# Patient Record
Sex: Female | Born: 1990 | Race: White | Hispanic: No | Marital: Married | Smoking: Never smoker
Health system: Southern US, Community
[De-identification: ages and names within clinical notes are randomized; demographics above are authoritative.]

## PROBLEM LIST (undated history)

## (undated) DIAGNOSIS — F509 Eating disorder, unspecified: Secondary | ICD-10-CM

## (undated) DIAGNOSIS — S99919A Unspecified injury of unspecified ankle, initial encounter: Secondary | ICD-10-CM

## (undated) DIAGNOSIS — F419 Anxiety disorder, unspecified: Secondary | ICD-10-CM

## (undated) DIAGNOSIS — R519 Headache, unspecified: Secondary | ICD-10-CM

## (undated) DIAGNOSIS — R51 Headache: Secondary | ICD-10-CM

## (undated) HISTORY — DX: Eating disorder, unspecified: F50.9

## (undated) HISTORY — DX: Headache, unspecified: R51.9

## (undated) HISTORY — DX: Headache: R51

## (undated) HISTORY — PX: HERNIA REPAIR: SHX51

## (undated) HISTORY — DX: Anxiety disorder, unspecified: F41.9

## (undated) HISTORY — PX: WISDOM TOOTH EXTRACTION: SHX21

---
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
## 2013-01-02 ENCOUNTER — Ambulatory Visit: Payer: 59 | Attending: Gastroenterology | Admitting: Physical Therapy

## 2013-01-02 DIAGNOSIS — IMO0001 Reserved for inherently not codable concepts without codable children: Secondary | ICD-10-CM | POA: Insufficient documentation

## 2013-01-02 DIAGNOSIS — M2420 Disorder of ligament, unspecified site: Secondary | ICD-10-CM | POA: Insufficient documentation

## 2013-01-02 DIAGNOSIS — M629 Disorder of muscle, unspecified: Secondary | ICD-10-CM | POA: Insufficient documentation

## 2013-01-08 ENCOUNTER — Ambulatory Visit: Payer: 59 | Attending: Gastroenterology | Admitting: Physical Therapy

## 2013-01-08 DIAGNOSIS — M629 Disorder of muscle, unspecified: Secondary | ICD-10-CM | POA: Insufficient documentation

## 2013-01-08 DIAGNOSIS — M2420 Disorder of ligament, unspecified site: Secondary | ICD-10-CM | POA: Insufficient documentation

## 2013-01-08 DIAGNOSIS — IMO0001 Reserved for inherently not codable concepts without codable children: Secondary | ICD-10-CM | POA: Insufficient documentation

## 2013-01-15 ENCOUNTER — Ambulatory Visit: Payer: 59 | Admitting: Physical Therapy

## 2013-01-22 ENCOUNTER — Ambulatory Visit: Payer: 59 | Admitting: Physical Therapy

## 2013-01-29 ENCOUNTER — Ambulatory Visit: Payer: 59 | Admitting: Physical Therapy

## 2013-02-05 ENCOUNTER — Ambulatory Visit: Payer: 59 | Attending: Gastroenterology | Admitting: Physical Therapy

## 2013-02-05 DIAGNOSIS — M629 Disorder of muscle, unspecified: Secondary | ICD-10-CM | POA: Insufficient documentation

## 2013-02-05 DIAGNOSIS — IMO0001 Reserved for inherently not codable concepts without codable children: Secondary | ICD-10-CM | POA: Insufficient documentation

## 2013-02-05 DIAGNOSIS — M2420 Disorder of ligament, unspecified site: Secondary | ICD-10-CM | POA: Insufficient documentation

## 2013-02-14 ENCOUNTER — Ambulatory Visit: Payer: 59 | Admitting: Physical Therapy

## 2013-02-19 ENCOUNTER — Ambulatory Visit: Payer: 59 | Admitting: Physical Therapy

## 2013-02-26 ENCOUNTER — Ambulatory Visit: Payer: 59 | Admitting: Physical Therapy

## 2013-03-05 ENCOUNTER — Ambulatory Visit: Payer: 59 | Attending: Gastroenterology | Admitting: Physical Therapy

## 2013-03-05 DIAGNOSIS — M2420 Disorder of ligament, unspecified site: Secondary | ICD-10-CM | POA: Insufficient documentation

## 2013-03-05 DIAGNOSIS — IMO0001 Reserved for inherently not codable concepts without codable children: Secondary | ICD-10-CM | POA: Insufficient documentation

## 2013-03-05 DIAGNOSIS — M629 Disorder of muscle, unspecified: Secondary | ICD-10-CM | POA: Insufficient documentation

## 2013-03-12 ENCOUNTER — Encounter: Payer: 59 | Admitting: Physical Therapy

## 2013-03-20 ENCOUNTER — Ambulatory Visit: Payer: 59 | Admitting: Physical Therapy

## 2013-03-26 ENCOUNTER — Encounter: Payer: 59 | Admitting: Physical Therapy

## 2013-04-02 ENCOUNTER — Encounter: Payer: 59 | Admitting: Physical Therapy

## 2013-04-04 ENCOUNTER — Ambulatory Visit: Payer: 59 | Attending: Gastroenterology | Admitting: Physical Therapy

## 2013-04-04 DIAGNOSIS — IMO0001 Reserved for inherently not codable concepts without codable children: Secondary | ICD-10-CM | POA: Insufficient documentation

## 2013-04-04 DIAGNOSIS — M629 Disorder of muscle, unspecified: Secondary | ICD-10-CM | POA: Insufficient documentation

## 2013-04-04 DIAGNOSIS — M2420 Disorder of ligament, unspecified site: Secondary | ICD-10-CM | POA: Insufficient documentation

## 2013-04-16 ENCOUNTER — Ambulatory Visit: Payer: 59 | Admitting: Physical Therapy

## 2013-04-30 ENCOUNTER — Encounter: Payer: 59 | Admitting: Physical Therapy

## 2013-05-07 ENCOUNTER — Ambulatory Visit: Payer: 59 | Attending: Gastroenterology | Admitting: Physical Therapy

## 2013-05-07 DIAGNOSIS — IMO0001 Reserved for inherently not codable concepts without codable children: Secondary | ICD-10-CM | POA: Insufficient documentation

## 2013-05-07 DIAGNOSIS — M629 Disorder of muscle, unspecified: Secondary | ICD-10-CM | POA: Insufficient documentation

## 2013-05-07 DIAGNOSIS — M2420 Disorder of ligament, unspecified site: Secondary | ICD-10-CM | POA: Insufficient documentation

## 2013-05-14 ENCOUNTER — Ambulatory Visit: Payer: 59 | Admitting: Physical Therapy

## 2013-05-22 ENCOUNTER — Ambulatory Visit: Payer: 59 | Admitting: Physical Therapy

## 2013-06-12 ENCOUNTER — Ambulatory Visit: Payer: 59 | Attending: Gastroenterology | Admitting: Physical Therapy

## 2013-06-12 DIAGNOSIS — IMO0001 Reserved for inherently not codable concepts without codable children: Secondary | ICD-10-CM | POA: Insufficient documentation

## 2013-06-12 DIAGNOSIS — M2420 Disorder of ligament, unspecified site: Secondary | ICD-10-CM | POA: Insufficient documentation

## 2013-06-12 DIAGNOSIS — M629 Disorder of muscle, unspecified: Secondary | ICD-10-CM | POA: Insufficient documentation

## 2013-06-19 ENCOUNTER — Ambulatory Visit: Payer: 59 | Admitting: Physical Therapy

## 2013-07-17 ENCOUNTER — Ambulatory Visit: Payer: 59 | Attending: Gastroenterology | Admitting: Physical Therapy

## 2013-07-17 DIAGNOSIS — IMO0001 Reserved for inherently not codable concepts without codable children: Secondary | ICD-10-CM | POA: Diagnosis present

## 2013-07-17 DIAGNOSIS — M2420 Disorder of ligament, unspecified site: Secondary | ICD-10-CM | POA: Diagnosis not present

## 2013-07-17 DIAGNOSIS — M629 Disorder of muscle, unspecified: Secondary | ICD-10-CM | POA: Insufficient documentation

## 2013-08-20 ENCOUNTER — Ambulatory Visit: Payer: 59 | Attending: Gastroenterology | Admitting: Physical Therapy

## 2013-08-20 DIAGNOSIS — M629 Disorder of muscle, unspecified: Secondary | ICD-10-CM | POA: Diagnosis not present

## 2013-08-20 DIAGNOSIS — M2420 Disorder of ligament, unspecified site: Secondary | ICD-10-CM | POA: Insufficient documentation

## 2013-08-20 DIAGNOSIS — IMO0001 Reserved for inherently not codable concepts without codable children: Secondary | ICD-10-CM | POA: Insufficient documentation

## 2013-09-17 ENCOUNTER — Ambulatory Visit: Payer: 59 | Admitting: Physical Therapy

## 2013-09-27 ENCOUNTER — Ambulatory Visit: Payer: 59 | Attending: Gastroenterology | Admitting: Physical Therapy

## 2013-09-27 DIAGNOSIS — IMO0001 Reserved for inherently not codable concepts without codable children: Secondary | ICD-10-CM | POA: Diagnosis present

## 2013-09-27 DIAGNOSIS — M629 Disorder of muscle, unspecified: Secondary | ICD-10-CM | POA: Insufficient documentation

## 2013-09-27 DIAGNOSIS — M2420 Disorder of ligament, unspecified site: Secondary | ICD-10-CM | POA: Diagnosis not present

## 2013-11-05 ENCOUNTER — Ambulatory Visit: Payer: 59 | Attending: Gastroenterology | Admitting: Physical Therapy

## 2013-11-05 DIAGNOSIS — K5900 Constipation, unspecified: Secondary | ICD-10-CM | POA: Diagnosis present

## 2015-03-03 ENCOUNTER — Ambulatory Visit (INDEPENDENT_AMBULATORY_CARE_PROVIDER_SITE_OTHER): Payer: Commercial Managed Care - HMO | Admitting: Sports Medicine

## 2015-03-03 ENCOUNTER — Ambulatory Visit (INDEPENDENT_AMBULATORY_CARE_PROVIDER_SITE_OTHER): Payer: Commercial Managed Care - HMO

## 2015-03-03 VITALS — BP 114/70 | HR 64 | Resp 18 | Wt 118.0 lb

## 2015-03-03 DIAGNOSIS — M25571 Pain in right ankle and joints of right foot: Secondary | ICD-10-CM | POA: Insufficient documentation

## 2015-03-03 NOTE — Progress Notes (Signed)
   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 Since then she's had persistent pain without mechanical symptoms, moderate, persistent with radiation around the underside and posterior of the lateral malleolus.  Past medical history, Surgical history, Family history not pertinant except as noted below, Social history, Allergies, and medications have been entered into the medical record, reviewed, and no changes needed.   Review of Systems: No headache, visual changes, nausea, vomiting, diarrhea, constipation, dizziness, abdominal pain, skin rash, fevers, chills, night sweats, weight loss, swollen lymph nodes, body aches, joint swelling, muscle aches, chest pain, shortness of breath, mood changes, visual or auditory hallucinations.   Objective:   General: Well Developed, well nourished, and in no acute distress.  Neuro/Psych: Alert and oriented x3, extra-ocular muscles intact, able to move all 4 extremities, sensation grossly intact. Skin: Warm and dry, no rashes noted.  Respiratory: Not using accessory muscles, speaking in full sentences, trachea midline.  Cardiovascular: Pulses palpable, no extremity edema. Abdomen: Does not appear distended. Right Ankle: Visible fullness behind the lateral malleolus Range of motion is full in all directions. Strength is 5/5 in all directions. Stable lateral and medial ligaments; squeeze test and kleiger test unremarkable; Talar dome nontender; No pain at base of 5th MT; No tenderness over cuboid; No tenderness over N spot or navicular prominence Tender behind the lateral malleolus over the peroneus longus, with  reproduction of pain with resisted eversion. No sign of peroneal tendon subluxations; Negative tarsal tunnel tinel's  Procedure: Real-time Ultrasound Guided Injection of right peroneal tendon sheath Device: GE Logiq E  Verbal informed consent obtained.  Time-out conducted.  Noted no overlying erythema, induration, or other signs of local infection.  Skin prepped in a sterile fashion.  Local anesthesia: Topical Ethyl chloride.  With sterile technique and under real time ultrasound guidance:  25-gauge needle advanced just deep to the peroneus longus and the fibula, 1 mL kenalog 40, 1 mL lidocaine, 1 mL Marcaine injected easily. Completed without difficulty  Pain immediately resolved suggesting accurate placement of the medication.  Advised to call if fevers/chills, erythema, induration, drainage, or persistent bleeding.  Images permanently stored and available for review in the ultrasound unit.  Impression: Technically successful ultrasound guided injection.  Impression and Recommendations:   This case required medical decision making of moderate complexity.

## 2015-03-03 NOTE — Assessment & Plan Note (Signed)
Likely represents peroneal tendinitis, injected as above, continue boot for another week, then transition into home rehabilitation exercises.

## 2015-03-26 ENCOUNTER — Ambulatory Visit (INDEPENDENT_AMBULATORY_CARE_PROVIDER_SITE_OTHER): Payer: Commercial Managed Care - HMO | Admitting: Sports Medicine

## 2015-03-26 DIAGNOSIS — M25571 Pain in right ankle and joints of right foot: Secondary | ICD-10-CM

## 2015-03-26 MED ORDER — MELOXICAM 15 MG PO TABS: ORAL_TABLET | ORAL | Status: DC

## 2015-03-26 NOTE — Progress Notes (Signed)
  Subjective:    CC: Follow-up  HPI: Peroneal Tendinitis: Fantastic response to peroneal tendon sheath injection at the last visit, she is pain-free for the most part.  She is hesitant to get back into running and biking.  Past medical history, Surgical history, Family history not pertinant except as noted below, Social history, Allergies, and medications have been entered into the medical record, reviewed, and no changes needed.   Review of Systems: No fevers, chills, night sweats, weight loss, chest pain, or shortness of breath.   Objective:    General: Well Developed, well nourished, and in no acute distress.  Neuro: Alert and oriented x3, extra-ocular muscles intact, sensation grossly intact.  HEENT: Normocephalic, atraumatic, pupils equal round reactive to light, neck supple, no masses, no lymphadenopathy, thyroid nonpalpable.  Skin: Warm and dry, no rashes. Cardiac: Regular rate and rhythm, no murmurs rubs or gallops, no lower extremity edema.  Respiratory: Clear to auscultation bilaterally. Not using accessory muscles, speaking in full sentences. Right Ankle: No visible erythema or swelling. Range of motion is full in all directions. Strength is 5/5 in all directions. Stable lateral and medial ligaments; squeeze test and kleiger test unremarkable; Talar dome nontender; No pain at base of 5th MT; No tenderness over cuboid; No tenderness over N spot or navicular prominence No tenderness on posterior aspects of lateral and medial malleolus No sign of peroneal tendon subluxations; Negative tarsal tunnel tinel's Able to walk 4 steps. Able to jump up and down on the affected extremity without pain. Marked pes cavus.  Impression and Recommendations:

## 2015-03-26 NOTE — Assessment & Plan Note (Signed)
Fantastic response to peroneal tendon sheath injection at the last visit. Continue to work aggressively with rehabilitation exercises. She does have severe pes cavus, and will return for custom orthotics. Adding meloxicam. Ultimately we may need an MRI if persistence of symptoms.

## 2015-03-28 ENCOUNTER — Ambulatory Visit (INDEPENDENT_AMBULATORY_CARE_PROVIDER_SITE_OTHER): Payer: Commercial Managed Care - HMO | Admitting: Sports Medicine

## 2015-03-28 VITALS — BP 121/67 | HR 78 | Resp 18 | Wt 115.0 lb

## 2015-03-28 DIAGNOSIS — M25571 Pain in right ankle and joints of right foot: Secondary | ICD-10-CM | POA: Diagnosis not present

## 2015-03-28 NOTE — Progress Notes (Signed)

## 2015-03-28 NOTE — Assessment & Plan Note (Signed)
Good response to peroneal tendon sheath injection. We'll continue to work aggressively with rehabilitation exercises and we'll gently try to get back into a walk/run regimen. Continue meloxicam. Custom orthotics as above, if persistence of symptoms in one month we will consider an MRI.

## 2015-03-31 ENCOUNTER — Ambulatory Visit: Payer: Commercial Managed Care - HMO | Admitting: Sports Medicine

## 2015-04-28 ENCOUNTER — Ambulatory Visit: Payer: Commercial Managed Care - HMO | Admitting: Sports Medicine

## 2015-05-02 ENCOUNTER — Ambulatory Visit: Payer: Commercial Managed Care - HMO | Admitting: Sports Medicine

## 2015-05-05 ENCOUNTER — Ambulatory Visit (INDEPENDENT_AMBULATORY_CARE_PROVIDER_SITE_OTHER): Payer: Commercial Managed Care - HMO | Admitting: Sports Medicine

## 2015-05-05 VITALS — BP 112/60 | HR 69 | Resp 16 | Wt 116.4 lb

## 2015-05-05 DIAGNOSIS — M25571 Pain in right ankle and joints of right foot: Secondary | ICD-10-CM | POA: Diagnosis not present

## 2015-05-05 NOTE — Progress Notes (Signed)
  Subjective:    CC: follow-up  HPI: Right ankle pain: Diagnosis peroneal tendinitis, injected and did extremely well, we made custom orthotics at the last visit and she is pain-free and getting back into running. Still has some weakness but overall doing okay.  Past medical history, Surgical history, Family history not pertinant except as noted below, Social history, Allergies, and medications have been entered into the medical record, reviewed, and no changes needed.   Review of Systems: No fevers, chills, night sweats, weight loss, chest pain, or shortness of breath.   Objective:    General: Well Developed, well nourished, and in no acute distress.  Neuro: Alert and oriented x3, extra-ocular muscles intact, sensation grossly intact.  HEENT: Normocephalic, atraumatic, pupils equal round reactive to light, neck supple, no masses, no lymphadenopathy, thyroid nonpalpable.  Skin: Warm and dry, no rashes. Cardiac: Regular rate and rhythm, no murmurs rubs or gallops, no lower extremity edema.  Respiratory: Clear to auscultation bilaterally. Not using accessory muscles, speaking in full sentences. Right Ankle: No visible erythema or swelling. Range of motion is full in all directions. Strength is 5/5 in all directions. Stable lateral and medial ligaments; squeeze test and kleiger test unremarkable; Talar dome nontender; No pain at base of 5th MT; No tenderness over cuboid; No tenderness over N spot or navicular prominence No tenderness on posterior aspects of lateral and medial malleolus No sign of peroneal tendon subluxations; Negative tarsal tunnel tinel's Able to walk 4 steps.  Impression and Recommendations:

## 2015-05-05 NOTE — Assessment & Plan Note (Signed)
Did extremely well after apparently all tendon sheath injection as well as custom orthotics, pain-free now, building up her strength. Adding consent for as needed use, may continue meloxicam as needed, and return to see me in 6 months. If she is not completely better in 3 months we will obtain an MRI.

## 2015-06-05 ENCOUNTER — Telehealth: Payer: Self-pay

## 2015-06-05 DIAGNOSIS — M25571 Pain in right ankle and joints of right foot: Secondary | ICD-10-CM

## 2015-06-05 NOTE — Telephone Encounter (Signed)
Left VM informing pt MRI ordered.

## 2015-06-05 NOTE — Telephone Encounter (Signed)
Pt left VM stating she doesn't feel her strain is healing the way you all feel it should. Would like to know if it's time to have an MRI ordered. Please advise.

## 2015-06-05 NOTE — Telephone Encounter (Signed)
Yes, MRI ordered.

## 2015-07-09 ENCOUNTER — Ambulatory Visit (INDEPENDENT_AMBULATORY_CARE_PROVIDER_SITE_OTHER): Payer: Commercial Managed Care - HMO | Admitting: Sports Medicine

## 2015-07-09 DIAGNOSIS — M25571 Pain in right ankle and joints of right foot: Secondary | ICD-10-CM

## 2015-07-09 NOTE — Progress Notes (Signed)
  Subjective:    CC: Recurrent right ankle pain  HPI: This is a pleasant 25 year old female, she is a long distance runner, we treated her several months ago for right peroneal tendinitis, she has custom orthotics, and did some home rehabilitation exercises, we also injected her peroneal tendon sheath and she did well. Unfortunately she started having recurrence of pain after a long hike, no popping, no mechanical symptoms. We did obtain an MRI the results of which will be dictated below.  Past medical history, Surgical history, Family history not pertinant except as noted below, Social history, Allergies, and medications have been entered into the medical record, reviewed, and no changes needed.   Review of Systems: No fevers, chills, night sweats, weight loss, chest pain, or shortness of breath.   Objective:    General: Well Developed, well nourished, and in no acute distress.  Neuro: Alert and oriented x3, extra-ocular muscles intact, sensation grossly intact.  HEENT: Normocephalic, atraumatic, pupils equal round reactive to light, neck supple, no masses, no lymphadenopathy, thyroid nonpalpable.  Skin: Warm and dry, no rashes. Cardiac: Regular rate and rhythm, no murmurs rubs or gallops, no lower extremity edema.  Respiratory: Clear to auscultation bilaterally. Not using accessory muscles, speaking in full sentences. Right Ankle: No visible erythema or swelling. Range of motion is full in all directions. Strength is 5/5 in all directions. Stable lateral and medial ligaments; squeeze test and kleiger test unremarkable; Tender to palpation over the lateral talar dome and talofibular joint No pain at base of 5th MT; No tenderness over cuboid; No tenderness over N spot or navicular prominence Tender to palpation behind the lateral malleolus reproduction of pain with resisted eversion No sign of peroneal tendon subluxations; Negative tarsal tunnel tinel's Able to walk 4  steps.  Procedure: Real-time Ultrasound Guided Injection of right ankle joint Device: GE Logiq E  Verbal informed consent obtained.  Time-out conducted.  Noted no overlying erythema, induration, or other signs of local infection.  Skin prepped in a sterile fashion.  Local anesthesia: Topical Ethyl chloride.  With sterile technique and under real time ultrasound guidance:  1 mL lidocaine, 1 mL Kenalog 40 injected easily. Completed without difficulty  Pain immediately resolved suggesting accurate placement of the medication.  Advised to call if fevers/chills, erythema, induration, drainage, or persistent bleeding.  Images permanently stored and available for review in the ultrasound unit.  Impression: Technically successful ultrasound guided injection.  Procedure: Real-time Ultrasound Guided Injection of right peroneal tendon sheath Device: GE Logiq E  Verbal informed consent obtained.  Time-out conducted.  Noted no overlying erythema, induration, or other signs of local infection.  Skin prepped in a sterile fashion.  Local anesthesia: Topical Ethyl chloride.  With sterile technique and under real time ultrasound guidance:  1 mL lidocaine, 1 mL Kenalog 40 injected easily taking care to avoid intratendinous injection. Completed without difficulty  Pain immediately resolved suggesting accurate placement of the medication.  Advised to call if fevers/chills, erythema, induration, drainage, or persistent bleeding.  Images permanently stored and available for review in the ultrasound unit.  Impression: Technically successful ultrasound guided injection.  Impression and Recommendations:

## 2015-07-09 NOTE — Assessment & Plan Note (Signed)
Persistent peroneal symptoms, peroneal tendon sheath injection as well as ankle joint injection, she did have some pain at the sinus tarsi. Continue orthotics, I'm also going to recommend formal physical therapy, and she is going to be getting the [employer] Aircast A60 ankle support

## 2015-07-18 ENCOUNTER — Ambulatory Visit (INDEPENDENT_AMBULATORY_CARE_PROVIDER_SITE_OTHER): Payer: Commercial Managed Care - HMO | Admitting: Sports Medicine

## 2015-07-18 VITALS — BP 126/84 | HR 71

## 2015-07-18 DIAGNOSIS — M25571 Pain in right ankle and joints of right foot: Secondary | ICD-10-CM

## 2015-07-18 NOTE — Progress Notes (Signed)
    Subjective:    Patient ID: Brittany [name], female    DOB: 1990-07-23, 25 y.o.   MRN: [id number]  HPI  Pt presents to clinic to be fitted for an ankle brace.   Review of Systems     Objective:   Physical Exam        Assessment & Plan:   Ankle brace applied to the right foot.  Pt examined by Dr. Karie Schwalbe.   This was supposed to be a nurse visit however I spent a significant amount of time with the patient placing the brace and explaining to her what the next steps were.  I spent 25 minutes with this patient, greater than 50% was face-to-face time counseling regarding the above diagnoses  ___________________________________________ Ihor Austinhomas J. Benjamin Stainhekkekandam, M.D., ABFM., CAQSM. Primary Care and Sports Medicine [facility]  Adjunct Instructor of Family Medicine  [employer]

## 2015-07-18 NOTE — Assessment & Plan Note (Signed)
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 She has not yet started physical therapy. She is interested in seeing Dr. Darrick PennaFields which I think this is appropriate. I'm concerned that she has not been taking the appropriate amount of time off of running. Ultimately we may have to cast her for forced compliance. Special ankle brace was applied today.

## 2015-07-24 ENCOUNTER — Encounter: Payer: Self-pay | Admitting: Sports Medicine

## 2015-07-24 ENCOUNTER — Ambulatory Visit (INDEPENDENT_AMBULATORY_CARE_PROVIDER_SITE_OTHER): Payer: Commercial Managed Care - HMO | Admitting: Sports Medicine

## 2015-07-24 VITALS — BP 105/68 | HR 80 | Ht 64.0 in | Wt 115.0 lb

## 2015-07-24 DIAGNOSIS — M25571 Pain in right ankle and joints of right foot: Secondary | ICD-10-CM

## 2015-07-24 DIAGNOSIS — M216X9 Other acquired deformities of unspecified foot: Secondary | ICD-10-CM | POA: Diagnosis not present

## 2015-07-24 DIAGNOSIS — Q796 Ehlers-Danlos syndrome: Secondary | ICD-10-CM | POA: Diagnosis not present

## 2015-07-24 DIAGNOSIS — Q667 Congenital pes cavus, unspecified foot: Secondary | ICD-10-CM | POA: Insufficient documentation

## 2015-07-24 DIAGNOSIS — M357 Hypermobility syndrome: Secondary | ICD-10-CM | POA: Insufficient documentation

## 2015-07-24 NOTE — Progress Notes (Signed)
   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 Her pain first began about 2 years ago. She self treated by limiting activity and gradually increasing with swimming, then biking, and back to running. Her pain recurred 7 months ago without obvious injury or trauma to the area. She had suspected peroneal tendinitis and received injections in February.  An MRI of the Right ankle on 06/20/15 report is available which read an old well healed lateral malleolus fracture, peroneal brevis and longus tendon tendinosus, and degeneration of the posterior talofibular and calcanofibular ligaments.  Patient has continued right ankle pain and received another injection at the peroneal tendon sheath and ankle on 07/09/15. She has adjusted her activity to increase stationary biking, light running on flat surface (up to 4-5 miles). She uses an aircast brace and orthotics as well.  First injection helped but last injections only relieved pain for a few days.  No past medical history on file.  Review of Systems:   Review of Systems  Cardiovascular: Negative for leg swelling.  Musculoskeletal: Positive for joint pain. Negative for myalgias and back pain.       Right ankle pain  Skin: Negative for rash.  Neurological: Negative for tingling.   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 She appears well-developed. No distress.  Musculoskeletal:  Tenderness to palpation at the right lateral anterior and posterior malleolus. ROM intact at knees and ankles with hypermobility with foot inversion, talar mobility, elbow extension bilaterally. Increased laxity with thumb to wrist on left, not on  right. Feet with wide-forefoot and high arches. No warmth, erythema, or swelling on exam.   Neurological: She is alert and oriented to person, place, and time.  Strength 5/5 at bilateral lower extremities with extension/flexion at the knees, dorsiflexion/plantarflexion, hip abduction.   Skin: She is not diaphoretic.  Small ~0.5 cm circular contact abrasions at the medial and lateral malleolus on right foot     Assessment & Plan:   Patient with recurrent ankle injuries and ligament laxity on exam suggestive of benign hypermobility syndrome. She may likely have these recurrent injuries due to her hypermobile ankle ROM. Will continue current brace and orthotics and initiate additional exercise. -Continue current aircast brace and orthotics -Slip-on ankle support -Start single-leg balance, squat and reach, 1 foot ball bounce exercises -Continue activity as tolerated, ice ankle afterwards -f/u in 6 weeks

## 2015-07-24 NOTE — Assessment & Plan Note (Signed)
Pain is improved using ankle brace and orthotics  No TTP or pain today

## 2015-07-24 NOTE — Assessment & Plan Note (Signed)
This is likely giving her more ankle instability as well - "pedestal ankle"  She needs to really focus on a series of proprioception exercises and 1 leg balance She is not stable on this in office in spite of working on Bosu ball  Running gait is excellent  Needs to stop running trails until she gets better stability and cont using ankle support

## 2015-07-24 NOTE — Assessment & Plan Note (Signed)
I suspect this has triggered the ankle instability and injuries Both ankles have increased laxity  Thumb to wrist 1 elbow hyperextends Palms to floor 5th MCP . > 90 deg  Able to clasp hands behind back on shoulder mobility test

## 2015-08-20 ENCOUNTER — Ambulatory Visit: Payer: Commercial Managed Care - HMO | Admitting: Sports Medicine

## 2015-09-04 ENCOUNTER — Encounter (INDEPENDENT_AMBULATORY_CARE_PROVIDER_SITE_OTHER): Payer: Self-pay

## 2015-09-04 ENCOUNTER — Ambulatory Visit (INDEPENDENT_AMBULATORY_CARE_PROVIDER_SITE_OTHER): Payer: Commercial Managed Care - HMO | Admitting: Sports Medicine

## 2015-09-04 ENCOUNTER — Encounter: Payer: Self-pay | Admitting: Sports Medicine

## 2015-09-04 VITALS — BP 113/67 | Ht 64.0 in | Wt 115.0 lb

## 2015-09-04 DIAGNOSIS — Q796 Ehlers-Danlos syndrome: Secondary | ICD-10-CM

## 2015-09-04 DIAGNOSIS — M25571 Pain in right ankle and joints of right foot: Secondary | ICD-10-CM | POA: Diagnosis not present

## 2015-09-04 DIAGNOSIS — M357 Hypermobility syndrome: Secondary | ICD-10-CM

## 2015-09-04 NOTE — Patient Instructions (Signed)
Please continue ankle stabilization exercises.  You can add 1 legged squats to both sides into your regimen.  Continue wearing ankle brace while running.  You can also try doing exercises on a foam pad when feeling stable doing ankle stabilization exercises.

## 2015-09-04 NOTE — Progress Notes (Signed)
  Brittany [name] - 25 y.o. [name] MRN [id number]  Date of birth: 02-14-1990  SUBJECTIVE:  Including CC & ROS.  CC: right ankle pain Brittany [name] presents for follow-up of her right ankle pain and hypermobility syndrome. She has been doing the ankle stabilization and proprioceptive exercises as demonstrated in the office at her last visit about 6 weeks ago. She has been doing well with them but still is having some posterior lateral malleolus pain when running for long periods of time. She is an avid runner and usually runs about 50 minutes per day. She is able to run father and test her than her last visit she is still not 100% better. She presents for some guidance regarding further return to running. She states that her ankle brace does help her with feeling more stable her right ankle when she runs. The body helix does not help too much with ankle stabilization. Denies any numbness or tingling in her lower extremities.     ROS: No unexpected weight loss, fever, chills, swelling, instability, muscle pain, numbness/tingling, redness, otherwise see HPI   PMHx - Updated and reviewed.  Contributory factors include: Hypermobility syndrome PSHx - Updated and reviewed.  Contributory factors include:  Negative FHx - Updated and reviewed.  Contributory factors include:  Negative Social Hx - Updated and reviewed. Contributory factors include: Negative Medications - reviewed   DATA REVIEWED: Previous office visits  PHYSICAL EXAM:  VS: BP:113/67  HR: bpm  TEMP: ( )  RESP:   HT:5\' 4"  (162.6 cm)   WT:115 lb (52.2 kg)  BMI:19.8 PHYSICAL EXAM: Gen: NAD, alert, cooperative with exam, well-appearing HEENT: clear conjunctiva,  CV:  no edema, capillary refill brisk, normal rate Resp: non-labored Skin: no rashes, normal turgor  Neuro: no gross deficits.  Psych:  alert and oriented  Ankle & Foot: No visible swelling, ecchymosis, erythema, ulcers, calluses, blister Arch: Pes cavus present  Achilles  tendon without nodules or tenderness No swelling of retrocalcaneal bursa No pain at MT heads No pain at base of 5th MT; No tenderness over cuboid; No tenderness over N spot or navicular prominence No tenderness on lateral and medial malleolus No sign of peroneal tendon subluxations, but does have mild tenderness to palpation along the right peroneal tendon Full in plantarflexion, dorsiflexion, inversion, and eversion of the foot; flexion and extension of the toes She has 2+ laxity with talar tilt and inversion of the foot bilaterally Strength: 5/5 in all directions. 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  You can add 1 legged squats to both sides into your regimen.  Continue wearing ankle brace while running.  You can also try doing exercises on a foam pad when feeling stable doing ankle stabilization exercises.   Right ankle pain Continue wearing her orthotics on running. Continue ankle stabilization exercises. Continue wearing ankle brace when running. Discussed progression to running again with patient. Once feeling more stable on her ankle joint she can start weaning out of the ankle brace.

## 2015-09-04 NOTE — Assessment & Plan Note (Signed)
Continue wearing her orthotics on running. Continue ankle stabilization exercises. Continue wearing ankle brace when running. Discussed progression to running again with patient. Once feeling more stable on her ankle joint she can start weaning out of the ankle brace.

## 2015-09-04 NOTE — Assessment & Plan Note (Signed)
continue ankle stabilization exercises.  You can add 1 legged squats to both sides into your regimen.  Continue wearing ankle brace while running.  You can also try doing exercises on a foam pad when feeling stable doing ankle stabilization exercises.

## 2015-10-14 ENCOUNTER — Other Ambulatory Visit: Payer: Self-pay | Admitting: Sports Medicine

## 2015-10-14 DIAGNOSIS — M25571 Pain in right ankle and joints of right foot: Secondary | ICD-10-CM

## 2015-11-07 ENCOUNTER — Ambulatory Visit: Payer: Commercial Managed Care - HMO | Admitting: Sports Medicine

## 2016-01-05 HISTORY — PX: ANKLE SURGERY: SHX546

## 2016-02-09 ENCOUNTER — Encounter (INDEPENDENT_AMBULATORY_CARE_PROVIDER_SITE_OTHER): Payer: Self-pay

## 2016-02-09 ENCOUNTER — Ambulatory Visit (INDEPENDENT_AMBULATORY_CARE_PROVIDER_SITE_OTHER): Payer: Commercial Managed Care - HMO | Admitting: Sports Medicine

## 2016-02-09 VITALS — BP 100/64 | Ht 65.0 in | Wt 117.0 lb

## 2016-02-09 DIAGNOSIS — M25373 Other instability, unspecified ankle: Secondary | ICD-10-CM

## 2016-02-09 DIAGNOSIS — M357 Hypermobility syndrome: Secondary | ICD-10-CM

## 2016-02-09 NOTE — Progress Notes (Signed)
   Subjective:    Patient ID: Brittany [name], female    DOB: [birth year][name], 26 y.o.   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 Last seen 8/17 and advised to continue proprioception exercises and bracing on ankle. Reports good compliance and improvement from 50% to 60%. Continues to describe ankle instability w/ propensity to invert ankle R>L. Denies significant pain or recent ankle injuries. Currently running on flat road surfaces but unable to trail run, which is her goal.   Review of Systems As per HPI.     Objective:   Physical Exam  General: athletic-appearing Brittany [name] in NAD, A&Ox3  MSK: - nl medial longitudinal arch, decreased transverse arch, wide toe box - no bruising or effusion over ankles bilaterally - nl ROM to ankle plantarflexion/dorsiflexion bilaterally - increased ROM to foot inversion/eversion, 3+ talar tilt test on R and L - positive ankle anterior drawer bilaterally  Studies: R. Ankle MRI (2017) -  Tendinosis of peroneal brevis and longus. Degeneration of posterior TFL and CFP. Well-healed fibular fracture.    Assessment & Plan:   26 yo Brittany [name] trail runner w/ mild improvement of ankle instability w/ conservative management. While she appears to be functioning well, and running on flat surfaces, she is dissatisfied w/ her progress. We discussed surgical referral for possible ankle reconstruction but it is unclear how effective this procedure may be given her baseline hypermobility.   1. Surgical Consult- Contact information provided for Dr. Victorino DikeHewitt, foot and ankle ortho 2. PT Referral- Referred for formal PT to work on ankle stability 3. Continue bracing and activity PRN

## 2016-03-22 DIAGNOSIS — M25571 Pain in right ankle and joints of right foot: Secondary | ICD-10-CM | POA: Diagnosis not present

## 2016-03-22 DIAGNOSIS — M25572 Pain in left ankle and joints of left foot: Secondary | ICD-10-CM | POA: Diagnosis not present

## 2016-04-13 DIAGNOSIS — M25572 Pain in left ankle and joints of left foot: Secondary | ICD-10-CM | POA: Diagnosis not present

## 2016-04-13 DIAGNOSIS — M25571 Pain in right ankle and joints of right foot: Secondary | ICD-10-CM | POA: Diagnosis not present

## 2016-04-22 DIAGNOSIS — M25571 Pain in right ankle and joints of right foot: Secondary | ICD-10-CM | POA: Diagnosis not present

## 2016-04-22 DIAGNOSIS — M25572 Pain in left ankle and joints of left foot: Secondary | ICD-10-CM | POA: Diagnosis not present

## 2016-05-07 DIAGNOSIS — M25571 Pain in right ankle and joints of right foot: Secondary | ICD-10-CM | POA: Diagnosis not present

## 2016-05-07 DIAGNOSIS — M25572 Pain in left ankle and joints of left foot: Secondary | ICD-10-CM | POA: Diagnosis not present

## 2016-05-09 ENCOUNTER — Emergency Department (HOSPITAL_COMMUNITY)
Admission: EM | Admit: 2016-05-09 | Discharge: 2016-05-09 | Disposition: A | Discharge status: Home / Self Care | Payer: Commercial Managed Care - HMO | Attending: Emergency Medicine | Admitting: Emergency Medicine

## 2016-05-09 ENCOUNTER — Encounter (HOSPITAL_COMMUNITY): Payer: Self-pay | Admitting: Emergency Medicine

## 2016-05-09 ENCOUNTER — Emergency Department (HOSPITAL_COMMUNITY): Payer: Commercial Managed Care - HMO

## 2016-05-09 DIAGNOSIS — Y9289 Other specified places as the place of occurrence of the external cause: Secondary | ICD-10-CM | POA: Diagnosis not present

## 2016-05-09 DIAGNOSIS — M25571 Pain in right ankle and joints of right foot: Secondary | ICD-10-CM | POA: Diagnosis not present

## 2016-05-09 DIAGNOSIS — X509XXA Other and unspecified overexertion or strenuous movements or postures, initial encounter: Secondary | ICD-10-CM | POA: Diagnosis not present

## 2016-05-09 DIAGNOSIS — Y9302 Activity, running: Secondary | ICD-10-CM | POA: Diagnosis not present

## 2016-05-09 DIAGNOSIS — S93402A Sprain of unspecified ligament of left ankle, initial encounter: Secondary | ICD-10-CM | POA: Insufficient documentation

## 2016-05-09 DIAGNOSIS — S99912A Unspecified injury of left ankle, initial encounter: Secondary | ICD-10-CM | POA: Diagnosis present

## 2016-05-09 DIAGNOSIS — Y999 Unspecified external cause status: Secondary | ICD-10-CM | POA: Insufficient documentation

## 2016-05-09 DIAGNOSIS — T1490XA Injury, unspecified, initial encounter: Secondary | ICD-10-CM

## 2016-05-09 DIAGNOSIS — M19072 Primary osteoarthritis, left ankle and foot: Secondary | ICD-10-CM | POA: Insufficient documentation

## 2016-05-09 HISTORY — DX: Unspecified injury of unspecified ankle, initial encounter: S99.919A

## 2016-05-09 NOTE — Discharge Instructions (Signed)
Use is 1 of your splints as needed for comfort.  For pain, use ibuprofen, 400 mg 3 times a day with meals.  For the ankle sprain use ice on the sore area 3 times a day for 2 or 3 days.  After that begin using heat.  Heat is very helpful for arthritis.  You have a small amount of arthritis at the fibula talar junction, it is likely secondary to the repeated ankle sprains.  Follow-up with your sports medicine physician, for evaluation and treatment of the recurrent sprain and arthritis.

## 2016-05-09 NOTE — ED Triage Notes (Signed)
Twisted right ankle while running last night-- hx of injury to same ankle-- has ace wrap on, minimal swelling.

## 2016-05-09 NOTE — ED Provider Notes (Signed)
[facility]-EMERGENCY DEPT Provider Note   CSN: [id number] Arrival date & time: 05/09/16  1207   By signing my name below, I, Soijett Blue, attest that this documentation has been prepared under the direction and in the presence of Mancel BaleWentz, Maisha Bogen, MD. Electronically Signed: Soijett Blue, ED Scribe. 05/09/16. 1:00 PM.  History   Chief Complaint Chief Complaint  Patient presents with  . Ankle Injury    HPI Brittany [name] is a [age] y.o. female who presents to the Emergency Department complaining of right ankle injury occurring last night. Pt reports associated right ankle swelling. Pt has tried crutches and an ace wrap with no relief of her symptoms. Pt reports that she was running last night when she accidentally twisted her right ankle. She states that her right ankle pain is worsened with weight bearing. Pt notes that she is currently in rehab for prior recurrent right ankle injuries. Pt orthopedist is Dr. Darrick PennaFields and she notes that she will follow up with them for further evaluation. She denies color change, wound, and any other symptoms.      The history is provided by the patient. No language interpreter was used.    Past Medical History:  Diagnosis Date  . Ankle injury     Patient Active Problem List   Diagnosis Date Noted  . 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 Right ankle pain 03/03/2015    Past Surgical History:  Procedure Laterality Date  . WISDOM TOOTH EXTRACTION      OB History    No data available       Home Medications    Prior to Admission medications   Medication Sig Start Date End Date Taking? Authorizing Provider  meloxicam (MOBIC) 15 MG tablet TAKE 1 TABLET BY MOUTH EVERY MORNING WITH BREAKFAST FOR 2 WEEKS THEN DAILY AS NEEDED FOR PAIN 10/14/15   Monica Bectonhekkekandam, Thomas J, MD  sertraline (ZOLOFT) 100 MG tablet  02/27/15   [provider]  sertraline (ZOLOFT) 100 MG tablet TAKE 1 AND 1/2 TABLETS EACH  MORNING 09/01/15   [provider]  sertraline (ZOLOFT) 100 MG tablet TAKE 1 AND 1/2 TABLETS EACH MORNING 12/16/15   [provider]    Family History No family history on file.  Social History Social History  Substance Use Topics  . Smoking status: Never Smoker  . Smokeless tobacco: Never Used  . Alcohol use No     Allergies   Patient has no known allergies.   Review of Systems Review of Systems  Musculoskeletal: Positive for arthralgias (right ankle) and joint swelling (right ankle).  Skin: Negative for color change and wound.  All other systems reviewed and are negative.    Physical Exam Updated Vital Signs BP 119/69 (BP Location: Right Arm)   Pulse 86   Temp 98 F (36.7 C) (Oral)   Resp 18   Ht 5\' 4"  (1.626 m)   Wt 120 lb (54.4 kg)   LMP 04/16/2016   SpO2 98%   BMI 20.60 kg/m   Physical Exam  Constitutional: She is oriented to person, place, and time. She appears well-developed and well-nourished.  HENT:  Head: Normocephalic and atraumatic.  Eyes: EOM are normal.  Neck: Normal range of motion and phonation normal. Neck supple.  Cardiovascular: Normal rate and regular rhythm.   Pulmonary/Chest: Effort normal and breath sounds normal. She exhibits no tenderness.  Abdominal: Soft. She exhibits no distension.  Musculoskeletal: Normal range of motion.  Right ankle: She exhibits swelling. Tenderness.       Right lower leg: She exhibits bony tenderness.  Lateral right ankle tenderness and swelling. Minimal distal fibular tenderness. No instability. Neurovascularly intact.   Neurological: She is alert and oriented to person, place, and time. She exhibits normal muscle tone.  Skin: Skin is warm and dry.  Psychiatric: She has a normal mood and affect. Her behavior is normal. Judgment and thought content normal.  Nursing note and vitals reviewed.    ED Treatments / Results  DIAGNOSTIC STUDIES: Oxygen Saturation is 98% on RA, nl by my  interpretation.    COORDINATION OF CARE: 12:47 PM Discussed treatment plan with pt at bedside which includes right ankle xray and pt agreed to plan.   Radiology Dg Ankle Complete Right  Result Date: 05/09/2016 CLINICAL DATA:  Twisting injury recently with right ankle pain and swelling. Recurrent injuries. EXAM: RIGHT ANKLE - COMPLETE 3+ VIEW COMPARISON:  Right ankle radiographs 03/03/2015. FINDINGS: The mineralization and alignment are normal. There is no evidence of acute fracture or dislocation. The previously demonstrated tiny ossific density adjacent to the lateral malleolus is not clearly seen. There is mild spurring of both malleoli. Lateral soft tissue swelling has increased. There is evidence of an ankle joint effusion. IMPRESSION: Ankle joint effusion with lateral soft tissue swelling. No acute osseous findings demonstrated. Electronically Signed   By: Carey Bullocks M.D.   On: 05/09/2016 13:34    Procedures Procedures (including critical care time)  Medications Ordered in ED Medications - No data to display   Initial Impression / Assessment and Plan / ED Course  I have reviewed the triage vital signs and the nursing notes.  Pertinent imaging results that were available during my care of the patient were reviewed by me and considered in my medical decision making (see chart for details).     Medications - No data to display  Patient Vitals for the past 24 hrs:  BP Temp Temp src Pulse Resp SpO2 Height Weight  05/09/16 1336 107/72 - - 61 16 100 % - -  05/09/16 1214 - - - - - - 5\' 4"  (1.626 m) 120 lb (54.4 kg)  05/09/16 1212 119/69 98 F (36.7 C) Oral 86 18 98 % - -    2:01 PM Reevaluation with update and discussion. After initial assessment and treatment, an updated evaluation reveals no further complaints, findings discussed with the patient and all questions answered. Brittany [name] L    Final Clinical Impressions(s) / ED Diagnoses   Final diagnoses:  Sprain of left  ankle, unspecified ligament, initial encounter  DJD (degenerative joint disease), ankle and foot, left   Recurrent right ankle sprain, with mild degenerative joint changes, on my view of the x-ray.  These changes are beneath the distal fibula, at the talar junction.  Doubt fracture, gross joint instability or septic arthritis.  Nursing Notes Reviewed/ Care Coordinated Applicable Imaging Reviewed Interpretation of Laboratory Data incorporated into ED treatment  The patient appears reasonably screened and/or stabilized for discharge and I doubt any other medical condition or other [facility] requiring further screening, evaluation, or treatment in the ED at this time prior to discharge.  Plan: Home Medications-ibuprofen; Home Treatments-ankle splint as needed, gentle rehab; return here if the recommended treatment, does not improve the symptoms; Recommended follow up-sports medicine follow-up 1 week   New Prescriptions Discharge Medication List as of 05/09/2016  1:26 PM     I personally performed the services described in this documentation,  which was scribed in my presence. The recorded information has been reviewed and is accurate.     Mancel Bale, MD 05/09/16 [phone]

## 2016-05-14 ENCOUNTER — Ambulatory Visit (INDEPENDENT_AMBULATORY_CARE_PROVIDER_SITE_OTHER): Payer: Commercial Managed Care - HMO | Admitting: Student

## 2016-05-14 ENCOUNTER — Encounter: Payer: Self-pay | Admitting: Student

## 2016-05-14 VITALS — BP 118/62 | Ht 65.0 in | Wt 120.0 lb

## 2016-05-14 DIAGNOSIS — M357 Hypermobility syndrome: Secondary | ICD-10-CM

## 2016-05-14 DIAGNOSIS — IMO0001 Reserved for inherently not codable concepts without codable children: Secondary | ICD-10-CM

## 2016-05-14 DIAGNOSIS — S93401A Sprain of unspecified ligament of right ankle, initial encounter: Secondary | ICD-10-CM

## 2016-05-14 DIAGNOSIS — M25373 Other instability, unspecified ankle: Secondary | ICD-10-CM | POA: Diagnosis not present

## 2016-05-14 NOTE — Progress Notes (Deleted)
  Brittany SkainsMelanie Rice [name] - 26 y.o. female MRN [id number]  Date of birth: July 30, 1990  SUBJECTIVE:  Including CC & ROS.  CC: right ankle    Location:  Duration:  Quality:  Severity:  Timing:  Context:  Modifying factors:  Associated signs and symptoms:   ROS: No unexpected weight loss, fever, chills, swelling, instability, muscle pain, numbness/tingling, redness, otherwise see HPI   PMHx - Updated and reviewed.  Contributory factors include: Negative PSHx - Updated and reviewed.  Contributory factors include:  Negative FHx - Updated and reviewed.  Contributory factors include:  Negative Social Hx - Updated and reviewed. Contributory factors include: Negative Medications - reviewed   DATA REVIEWED: ***  PHYSICAL EXAM:  VS: BP:118/62  HR: bpm  TEMP: ( )  RESP:   HT:5\' 5"  (165.1 cm)   WT:120 lb (54.4 kg)  BMI:20 PHYSICAL EXAM: Gen: NAD, alert, cooperative with exam, well-appearing HEENT: clear conjunctiva,  CV:  no edema, capillary refill brisk, normal rate Resp: non-labored Skin: no rashes, normal turgor  Neuro: no gross deficits.  Psych:  alert and oriented ***  ASSESSMENT & PLAN:   No problem-specific Assessment & Plan notes found for this encounter.

## 2016-05-14 NOTE — Progress Notes (Signed)
Subjective:     Patient ID: Brittany [name], female   DOB: [birth year], 26 y.o.   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 This happened last Saturday 05/08/16 while running. She was wearing an ankle brace at the time. Patient reports initial swelling which has improved, and she is now able to bear some weight, using crutches intermittently. She was evaluated Sunday after injury in ED where Xrays showed no fracture but +joint effusion.  She was referred after last injury to Dr. Victorino DikeHewitt with foot/ankle ortho to discuss possible surgical options for stabilizing her ankle. She has not seen surgeon but has started additional PT with Dr. Liliane Bade'Hallaran.  Review of Systems +ankle instability +gait abnormality -numbness, tingling -fevers, chills +ecchymosis and swelling of skin     Objective:   Physical Exam General: Well-appearing female sitting on exam table. A&O x3, NAD Extremities: Warm, well-perfused. 2+ pulses in PT bilaterally.  Moderate edema over lateral side of R ankle under lateral malleolus.  Some residual bruising at lateral edge of R heel.  Normal longitudinal arch bilaterally. MSK: Tenderness to palpation over right ATFL and along lateral TF joint line. Normal ROM to dorsiflexion, plantar flexion bilaterally. Decreased ROM and strength to 4/5 with ankle inversion, eversion on Right limited by pain.  Increased talar tilt test bilaterally but left more lax than right. +ankle anterior drawer  Neuro: Sensation intact over bilateral distal extremities     Assessment:     Patient is 26 year old runner with ankle instability who has not improved with > 1 year conservative management of daily PT. She presents with new inversion ankle injury during running 05/08/16 that is likely a grade 2 sprain or partial tear of the ATFL, secondary to joint hypermobility. Today we  discussed surgical options with patient although she will benefit from discussing this and prognosis/recovery plan with Dr. Victorino DikeHewitt.    Plan:     Right ankle sprain: - New ankle brace, continue bracing with activity - Continue physical therapy with O'Halloren and home exercises - For now, just minimal ROM exercises with right ankle (alphabet) - Return if no improvement - NSAIDs PRN pain -gave new referral for Dr. Victorino DikeHewitt to discuss the merits of surgery for chronic ankle instability in setting of hypermobility. She was more open to getting opinion, given that she sprained her ankle again and it is limiting her ability to run.

## 2016-05-16 DIAGNOSIS — IMO0001 Reserved for inherently not codable concepts without codable children: Secondary | ICD-10-CM | POA: Insufficient documentation

## 2016-05-16 DIAGNOSIS — S93401A Sprain of unspecified ligament of right ankle, initial encounter: Secondary | ICD-10-CM | POA: Insufficient documentation

## 2016-06-07 DIAGNOSIS — M25571 Pain in right ankle and joints of right foot: Secondary | ICD-10-CM | POA: Diagnosis not present

## 2016-06-07 DIAGNOSIS — M25572 Pain in left ankle and joints of left foot: Secondary | ICD-10-CM | POA: Diagnosis not present

## 2016-06-11 DIAGNOSIS — G8929 Other chronic pain: Secondary | ICD-10-CM | POA: Diagnosis not present

## 2016-06-11 DIAGNOSIS — M25371 Other instability, right ankle: Secondary | ICD-10-CM | POA: Diagnosis not present

## 2016-06-11 DIAGNOSIS — M25571 Pain in right ankle and joints of right foot: Secondary | ICD-10-CM | POA: Diagnosis not present

## 2016-06-29 DIAGNOSIS — G8918 Other acute postprocedural pain: Secondary | ICD-10-CM | POA: Diagnosis not present

## 2016-06-29 DIAGNOSIS — M25371 Other instability, right ankle: Secondary | ICD-10-CM | POA: Diagnosis not present

## 2016-06-29 DIAGNOSIS — M7661 Achilles tendinitis, right leg: Secondary | ICD-10-CM | POA: Diagnosis not present

## 2016-06-29 DIAGNOSIS — M65871 Other synovitis and tenosynovitis, right ankle and foot: Secondary | ICD-10-CM | POA: Diagnosis not present

## 2016-07-14 DIAGNOSIS — S93491D Sprain of other ligament of right ankle, subsequent encounter: Secondary | ICD-10-CM | POA: Diagnosis not present

## 2016-07-30 DIAGNOSIS — S93491D Sprain of other ligament of right ankle, subsequent encounter: Secondary | ICD-10-CM | POA: Diagnosis not present

## 2016-09-09 DIAGNOSIS — M25572 Pain in left ankle and joints of left foot: Secondary | ICD-10-CM | POA: Diagnosis not present

## 2016-09-09 DIAGNOSIS — M25571 Pain in right ankle and joints of right foot: Secondary | ICD-10-CM | POA: Diagnosis not present

## 2016-09-16 DIAGNOSIS — M25572 Pain in left ankle and joints of left foot: Secondary | ICD-10-CM | POA: Diagnosis not present

## 2016-09-16 DIAGNOSIS — M25571 Pain in right ankle and joints of right foot: Secondary | ICD-10-CM | POA: Diagnosis not present

## 2016-09-21 DIAGNOSIS — M25572 Pain in left ankle and joints of left foot: Secondary | ICD-10-CM | POA: Diagnosis not present

## 2016-09-21 DIAGNOSIS — M25571 Pain in right ankle and joints of right foot: Secondary | ICD-10-CM | POA: Diagnosis not present

## 2016-09-30 DIAGNOSIS — Z09 Encounter for follow-up examination after completed treatment for conditions other than malignant neoplasm: Secondary | ICD-10-CM | POA: Diagnosis not present

## 2016-09-30 DIAGNOSIS — M25371 Other instability, right ankle: Secondary | ICD-10-CM | POA: Diagnosis not present

## 2016-10-01 DIAGNOSIS — M25571 Pain in right ankle and joints of right foot: Secondary | ICD-10-CM | POA: Diagnosis not present

## 2016-10-01 DIAGNOSIS — M25572 Pain in left ankle and joints of left foot: Secondary | ICD-10-CM | POA: Diagnosis not present

## 2016-10-05 DIAGNOSIS — M25572 Pain in left ankle and joints of left foot: Secondary | ICD-10-CM | POA: Diagnosis not present

## 2016-10-05 DIAGNOSIS — M25571 Pain in right ankle and joints of right foot: Secondary | ICD-10-CM | POA: Diagnosis not present

## 2016-10-14 DIAGNOSIS — M25571 Pain in right ankle and joints of right foot: Secondary | ICD-10-CM | POA: Diagnosis not present

## 2016-10-14 DIAGNOSIS — M25572 Pain in left ankle and joints of left foot: Secondary | ICD-10-CM | POA: Diagnosis not present

## 2016-10-27 DIAGNOSIS — M25572 Pain in left ankle and joints of left foot: Secondary | ICD-10-CM | POA: Diagnosis not present

## 2016-10-27 DIAGNOSIS — M25571 Pain in right ankle and joints of right foot: Secondary | ICD-10-CM | POA: Diagnosis not present

## 2016-11-03 DIAGNOSIS — M25571 Pain in right ankle and joints of right foot: Secondary | ICD-10-CM | POA: Diagnosis not present

## 2016-11-03 DIAGNOSIS — M25572 Pain in left ankle and joints of left foot: Secondary | ICD-10-CM | POA: Diagnosis not present

## 2016-11-10 DIAGNOSIS — M25572 Pain in left ankle and joints of left foot: Secondary | ICD-10-CM | POA: Diagnosis not present

## 2016-11-10 DIAGNOSIS — M25571 Pain in right ankle and joints of right foot: Secondary | ICD-10-CM | POA: Diagnosis not present

## 2016-11-17 DIAGNOSIS — M25572 Pain in left ankle and joints of left foot: Secondary | ICD-10-CM | POA: Diagnosis not present

## 2016-11-17 DIAGNOSIS — M25571 Pain in right ankle and joints of right foot: Secondary | ICD-10-CM | POA: Diagnosis not present

## 2016-11-24 DIAGNOSIS — M25571 Pain in right ankle and joints of right foot: Secondary | ICD-10-CM | POA: Diagnosis not present

## 2016-11-24 DIAGNOSIS — M25572 Pain in left ankle and joints of left foot: Secondary | ICD-10-CM | POA: Diagnosis not present

## 2016-12-01 DIAGNOSIS — M25572 Pain in left ankle and joints of left foot: Secondary | ICD-10-CM | POA: Diagnosis not present

## 2016-12-01 DIAGNOSIS — M25571 Pain in right ankle and joints of right foot: Secondary | ICD-10-CM | POA: Diagnosis not present

## 2016-12-03 DIAGNOSIS — M25371 Other instability, right ankle: Secondary | ICD-10-CM | POA: Diagnosis not present

## 2016-12-03 DIAGNOSIS — Z09 Encounter for follow-up examination after completed treatment for conditions other than malignant neoplasm: Secondary | ICD-10-CM | POA: Diagnosis not present

## 2016-12-08 DIAGNOSIS — M25571 Pain in right ankle and joints of right foot: Secondary | ICD-10-CM | POA: Diagnosis not present

## 2016-12-08 DIAGNOSIS — M25572 Pain in left ankle and joints of left foot: Secondary | ICD-10-CM | POA: Diagnosis not present

## 2016-12-22 DIAGNOSIS — M25572 Pain in left ankle and joints of left foot: Secondary | ICD-10-CM | POA: Diagnosis not present

## 2016-12-22 DIAGNOSIS — M25571 Pain in right ankle and joints of right foot: Secondary | ICD-10-CM | POA: Diagnosis not present

## 2017-01-12 DIAGNOSIS — M25571 Pain in right ankle and joints of right foot: Secondary | ICD-10-CM | POA: Diagnosis not present

## 2017-01-12 DIAGNOSIS — M25572 Pain in left ankle and joints of left foot: Secondary | ICD-10-CM | POA: Diagnosis not present

## 2017-01-19 DIAGNOSIS — M25571 Pain in right ankle and joints of right foot: Secondary | ICD-10-CM | POA: Diagnosis not present

## 2017-01-19 DIAGNOSIS — M25572 Pain in left ankle and joints of left foot: Secondary | ICD-10-CM | POA: Diagnosis not present

## 2017-02-02 DIAGNOSIS — M25572 Pain in left ankle and joints of left foot: Secondary | ICD-10-CM | POA: Diagnosis not present

## 2017-02-02 DIAGNOSIS — M25571 Pain in right ankle and joints of right foot: Secondary | ICD-10-CM | POA: Diagnosis not present

## 2017-02-23 DIAGNOSIS — M25572 Pain in left ankle and joints of left foot: Secondary | ICD-10-CM | POA: Diagnosis not present

## 2017-02-23 DIAGNOSIS — M25571 Pain in right ankle and joints of right foot: Secondary | ICD-10-CM | POA: Diagnosis not present

## 2017-03-09 DIAGNOSIS — M25571 Pain in right ankle and joints of right foot: Secondary | ICD-10-CM | POA: Diagnosis not present

## 2017-03-09 DIAGNOSIS — M25572 Pain in left ankle and joints of left foot: Secondary | ICD-10-CM | POA: Diagnosis not present

## 2017-03-23 DIAGNOSIS — M25371 Other instability, right ankle: Secondary | ICD-10-CM | POA: Diagnosis not present

## 2017-03-23 DIAGNOSIS — M25572 Pain in left ankle and joints of left foot: Secondary | ICD-10-CM | POA: Diagnosis not present

## 2017-03-23 DIAGNOSIS — M25571 Pain in right ankle and joints of right foot: Secondary | ICD-10-CM | POA: Diagnosis not present

## 2017-05-09 DIAGNOSIS — M25571 Pain in right ankle and joints of right foot: Secondary | ICD-10-CM | POA: Diagnosis not present

## 2017-05-09 DIAGNOSIS — M25572 Pain in left ankle and joints of left foot: Secondary | ICD-10-CM | POA: Diagnosis not present

## 2017-05-10 DIAGNOSIS — M25572 Pain in left ankle and joints of left foot: Secondary | ICD-10-CM | POA: Diagnosis not present

## 2017-05-10 DIAGNOSIS — M25571 Pain in right ankle and joints of right foot: Secondary | ICD-10-CM | POA: Diagnosis not present

## 2017-05-20 DIAGNOSIS — M25572 Pain in left ankle and joints of left foot: Secondary | ICD-10-CM | POA: Diagnosis not present

## 2017-05-20 DIAGNOSIS — M25571 Pain in right ankle and joints of right foot: Secondary | ICD-10-CM | POA: Diagnosis not present

## 2017-05-25 DIAGNOSIS — M25572 Pain in left ankle and joints of left foot: Secondary | ICD-10-CM | POA: Diagnosis not present

## 2017-05-25 DIAGNOSIS — M25571 Pain in right ankle and joints of right foot: Secondary | ICD-10-CM | POA: Diagnosis not present

## 2017-06-02 DIAGNOSIS — M25571 Pain in right ankle and joints of right foot: Secondary | ICD-10-CM | POA: Diagnosis not present

## 2017-06-02 DIAGNOSIS — M25572 Pain in left ankle and joints of left foot: Secondary | ICD-10-CM | POA: Diagnosis not present

## 2017-06-09 DIAGNOSIS — M25572 Pain in left ankle and joints of left foot: Secondary | ICD-10-CM | POA: Diagnosis not present

## 2017-06-09 DIAGNOSIS — M25571 Pain in right ankle and joints of right foot: Secondary | ICD-10-CM | POA: Diagnosis not present

## 2017-06-16 DIAGNOSIS — M25571 Pain in right ankle and joints of right foot: Secondary | ICD-10-CM | POA: Diagnosis not present

## 2017-06-16 DIAGNOSIS — M25572 Pain in left ankle and joints of left foot: Secondary | ICD-10-CM | POA: Diagnosis not present

## 2017-06-21 DIAGNOSIS — M25571 Pain in right ankle and joints of right foot: Secondary | ICD-10-CM | POA: Diagnosis not present

## 2017-06-21 DIAGNOSIS — M25572 Pain in left ankle and joints of left foot: Secondary | ICD-10-CM | POA: Diagnosis not present

## 2017-06-28 DIAGNOSIS — M25572 Pain in left ankle and joints of left foot: Secondary | ICD-10-CM | POA: Diagnosis not present

## 2017-06-28 DIAGNOSIS — M25571 Pain in right ankle and joints of right foot: Secondary | ICD-10-CM | POA: Diagnosis not present

## 2017-07-12 DIAGNOSIS — M25572 Pain in left ankle and joints of left foot: Secondary | ICD-10-CM | POA: Diagnosis not present

## 2017-07-12 DIAGNOSIS — M25571 Pain in right ankle and joints of right foot: Secondary | ICD-10-CM | POA: Diagnosis not present

## 2017-07-20 DIAGNOSIS — M25571 Pain in right ankle and joints of right foot: Secondary | ICD-10-CM | POA: Diagnosis not present

## 2017-07-20 DIAGNOSIS — M25572 Pain in left ankle and joints of left foot: Secondary | ICD-10-CM | POA: Diagnosis not present

## 2017-07-27 DIAGNOSIS — M25572 Pain in left ankle and joints of left foot: Secondary | ICD-10-CM | POA: Diagnosis not present

## 2017-07-27 DIAGNOSIS — M25571 Pain in right ankle and joints of right foot: Secondary | ICD-10-CM | POA: Diagnosis not present

## 2017-10-05 ENCOUNTER — Ambulatory Visit (INDEPENDENT_AMBULATORY_CARE_PROVIDER_SITE_OTHER): Payer: 59

## 2017-10-05 ENCOUNTER — Ambulatory Visit: Payer: Commercial Managed Care - HMO | Admitting: Sports Medicine

## 2017-10-05 ENCOUNTER — Ambulatory Visit: Payer: Self-pay

## 2017-10-05 VITALS — BP 100/68 | HR 68 | Ht 65.0 in | Wt 120.4 lb

## 2017-10-05 DIAGNOSIS — Z3200 Encounter for pregnancy test, result unknown: Secondary | ICD-10-CM | POA: Diagnosis not present

## 2017-10-05 DIAGNOSIS — M25551 Pain in right hip: Secondary | ICD-10-CM

## 2017-10-05 LAB — POCT URINE PREGNANCY: Preg Test, Ur: NEGATIVE

## 2017-10-05 NOTE — Procedures (Signed)
PROCEDURE NOTE:  Ultrasound Guided: Injection: Right hip Images were obtained and interpreted by myself, Gaspar Bidding, DO  Images have been saved and stored to PACS system. Images obtained on: GE S7 Ultrasound machine    ULTRASOUND FINDINGS:  Possible blunting of the right labrum.  Small hip effusion.  DESCRIPTION OF PROCEDURE:  The patient's clinical condition is marked by substantial pain and/or significant functional disability. Other conservative therapy has not provided relief, is contraindicated, or not appropriate. There is a reasonable likelihood that injection will significantly improve the patient's pain and/or functional impairment.   After discussing the risks, benefits and expected outcomes of the injection and all questions were reviewed and answered, the patient wished to undergo the above named procedure.  Verbal consent was obtained.  The ultrasound was used to identify the target structure and adjacent neurovascular structures. The skin was then prepped in sterile fashion and the target structure was injected under direct visualization using sterile technique as below:  Single injection performed as below: PREP: Alcohol and Ethel Chloride APPROACH:direct, stopcock technique, 22g 3.5 in. INJECTATE: 5 cc 1% lidocaine, 2 cc 0.5% Marcaine and 2 cc 40mg /mL DepoMedrol ASPIRATE: None DRESSING: Band-Aid  Post procedural instructions including recommending icing and warning signs for infection were reviewed.    This procedure was well tolerated and there were no complications.   IMPRESSION: Succesful Ultrasound Guided: Injection

## 2017-10-05 NOTE — Progress Notes (Signed)
Brittany [name]. Brittany [name] [facility] [phone]  Brittany [name] - 27 y.o. female MRN [id number]  Date of birth: [birth year]  Visit Date: 10/05/2017  PCP: Patient, No Pcp Per   Referred by: No ref. provider found   Scribe(s) for today's visit: Stevenson Clinch, CMA  SUBJECTIVE:  Brittany [name] is here for Initial Assessment (R hip pain)    HPI: Her R hip pain symptoms INITIALLY: Began several months ago but sx have started to flare-up more over the past few weeks. Sx started while doing therapy for her ankle. She is an avid runner. Described as mild-moderate tightness, can be sharp at times and achy at other times. Pain radiates into the R leg and at times into her back.  Worsened with abduction, jogging, stairs.  Improved with massage and stretching it.  Additional associated symptoms include: Hx of benign hypermobility syndrome. She has minor scoliosis. She reports some tightness in the L hip. Pain is mostly lateral, sometimes posterior, and other times anterior. Anterior pain radiates into the groin.     At this time symptoms are worsening compared to onset, sx are flaring up more over the past month.  She has not taken any medications for the pain. She has been doing PT. She has tried dry needling with minimal relief.   She has been seeing PT Dr. Hessie Diener. He sent her here.   No recent XR of hip or back.   REVIEW OF SYSTEMS: Denies night time disturbances. Reports fevers, chills, or night sweats. Denies unexplained weight loss. Denies personal history of cancer. Denies changes in bowel or bladder habits - pelvic floor dysphonia, relief with PT. Denies recent unreported falls. Denies new or worsening dyspnea or wheezing. Denies headaches or dizziness.  Denies numbness, tingling or weakness  In the extremities.  Denies dizziness or presyncopal episodes Reports lower extremity edema - recent ankle surgery.     HISTORY:  Prior history reviewed and updated per electronic medical record.  Social History   Occupational History  . Not on file  Tobacco Use  . Smoking status: Never Smoker  . Smokeless tobacco: Never Used  Substance and Sexual Activity  . Alcohol use: No    Alcohol/week: 0.0 standard drinks  . Drug use: No  . Sexual activity: Not on file   Social History   Social History Narrative  . Not on file    Past Medical History:  Diagnosis Date  . Ankle injury    Past Surgical History:  Procedure Laterality Date  . WISDOM TOOTH EXTRACTION     family history is not on file.  DATA OBTAINED & REVIEWED:  No results for input(s): HGBA1C, LABURIC, CREATINE in the last 8760 hours. Problem  Right Hip Pain   Followed by Dr. Berline Chough, seeing Brittany [name], PT for physical therapy X-rays of the hip were grossly unremarkable with questionable pincer deformity Intra-articular injection performed on 10/05/2017    OBJECTIVE:  VS:  HT:5\' 5"  (165.1 cm)   WT:120 lb 6.4 oz (54.6 kg)  BMI:20.04    BP:100/68  HR:68bpm  TEMP: ( )  RESP:98 %   PHYSICAL EXAM: CONSTITUTIONAL: Well-developed, Well-nourished and In no acute distress PSYCHIATRIC: Alert & appropriately interactive. and Not depressed or anxious appearing. RESPIRATORY: No increased work of breathing and Trachea Midline EYES: Pupils are equal., EOM intact without nystagmus. and No scleral icterus.  VASCULAR EXAM: Warm and well perfused NEURO: unremarkable  MSK Exam: Right hip  Well aligned, no significant deformity. No overlying skin changes. No focal bony tenderness No swelling Normal, non-painful Hip flexion, extension, internal and external rotation with pain and weakness with supine straight leg raise radiating to the groin. No significant pain with axial load and circumduction of the right hip.  Right anterior innominate. Normal, non-painful: Stinchfield testing.  Positive but negative FADIR and Faber testing.   Overall good flexibility however has varying degrees of flexibility with an the arc of hip flexion      ASSESSMENT   1. Right hip pain   2. Encounter for pregnancy test, result unknown     PLAN:  Pertinent additional documentation may be included in corresponding procedure notes, imaging studies, problem based documentation and patient instructions.  Procedures:  . US Guided Injection per procedure note  Medications:  No orders of the defined types were placed in this encounter.  Discussion/Instructions: Right hip pain Symptoms are consistent with likely underlying labral tear and markedly tight right hip flexors. Diagnostic and hopefully therapeutic injection performed today. Try to avoid exacerbating activities including squatting and rotational activities on one leg.  . Links to [employer] provided today per Patient Instructions.  These exercises were developed by Myles Lipps, [place] with a strong emphasis on core neuromuscular reducation and postural realignment through body-weight exercises. . Discussed the underlying features of tight hip flexors leading to crouched, fetal like position that results in spinal column compression.  Including lumbar hyperflexion with hypermobility, thoracic flexion with restrictive rotation and cervical lordosis reversal . Discussed red flag symptoms that warrant earlier emergent evaluation and patient voices understanding. . Activity modifications and the importance of avoiding exacerbating activities (limiting pain to no more than a 4 / 10 during or following activity) recommended and discussed.  Follow-up:  . Return in about 4 weeks (around 11/02/2017).  . If any lack of improvement: consider further diagnostic evaluation with MR arthrogram . At follow up will plan: to consider initial osteopathic manipulation     CMA/ATC served as scribe during this visit. History, Physical, and Plan performed by medical  provider. Documentation and orders reviewed and attested to.      [name], DO    [place] [facility] Physician

## 2017-10-05 NOTE — Patient Instructions (Addendum)
Also check out [employer]" which is a program developed by Dr. Myles Lipps.   There are links to a couple of his YouTube Videos below and I would like to see performing one of his videos 5-6 days per week.    A good intro video is: "Independence from Pain 7-minute Video" - [email or web address]   Exercises that focus more on the neck are as below: Dr. Derrill Kay with [employer] teaching neck and shoulder details Part 1 - https://[email or web address].be/[email or web address] Part 2 Dr. Derrill Kay with [facility] quick routine to practice daily - https://[email or web address].be/[email or web address]  Do not try to attempt the entire video when first beginning.    Try breaking of each exercise that he goes into shorter segments.  Otherwise if they perform an exercise for 45 seconds, start with 15 seconds and rest and then resume when they begin the new activity.  If you work your way up to being able to do these videos without having to stop, I expect you will see significant improvements in your pain.  If you enjoy his videos and would like to find out more you can look on his website: [email or web address].  He has a workout streaming option as well as a DVD set available for purchase.  Amazon has the best price for his DVDs.      You had an injection today.  Things to be aware of after injection are listed below: . You may experience no significant improvement or even a slight worsening in your symptoms during the first 24 to 48 hours.  After that we expect your symptoms to improve gradually over the next 2 weeks for the medicine to have its maximal effect.  You should continue to have improvement out to 6 weeks after your injection. . Dr. Berline Chough recommends icing the site of the injection for 20 minutes  1-2 times the day of your injection . You may shower but no swimming, tub bath or Jacuzzi for 24 hours. . If your bandage falls off this does not need to be replaced.  It is appropriate to remove the  bandage after 4 hours. . You may resume light activities as tolerated unless otherwise directed per Dr. Berline Chough during your visit  POSSIBLE STEROID SIDE EFFECTS:  Side effects from injectable steroids tend to be less than when taken orally however you may experience some of the symptoms listed below.  If experienced these should only last for a short period of time. Change in menstrual flow  Edema (swelling)  Increased appetite Skin flushing (redness)  Skin rash/acne  Thrush (oral) Yeast vaginitis    Increased sweating  Depression Increased blood glucose levels Cramping and leg/calf  Euphoria (feeling happy)  POSSIBLE PROCEDURE SIDE EFFECTS: The side effects of the injection are usually fairly minimal however if you may experience some of the following side effects that are usually self-limited and will is off on their own.  If you are concerned please feel free to call the office with questions:  Increased numbness or tingling  Nausea or vomiting  Swelling or bruising at the injection site   Please call our office if if you experience any of the following symptoms over the next week as these can be signs of infection:   Fever greater than 100.51F  Significant swelling at the injection site  Significant redness or drainage from the injection site  If after 2 weeks you are continuing to have worsening symptoms please call our office to discuss what the next appropriate actions  should be including the potential for a return office visit or other diagnostic testing.

## 2017-10-13 ENCOUNTER — Telehealth: Payer: Self-pay | Admitting: Sports Medicine

## 2017-10-13 NOTE — Telephone Encounter (Signed)
Copied from CRM [id number]. Topic: Quick Communication - See Telephone Encounter >> Oct 13, 2017  3:41 PM Jens Som A wrote: CRM for notification. See Telephone encounter for: 10/13/17. Ilene Qua Pt, from [employer] is calling for Dr. Berline Chough, Needed to know if patient has any updated precautions or updates regarding right hip?  He had not heard anything.  Is the patient still approiate for Pt? Please advise [phone]

## 2017-10-13 NOTE — Telephone Encounter (Signed)
Forwarding to Dr. Rigby to advise.  

## 2017-10-13 NOTE — Telephone Encounter (Signed)
See note

## 2017-11-02 ENCOUNTER — Encounter: Payer: Self-pay | Admitting: Sports Medicine

## 2017-11-02 ENCOUNTER — Ambulatory Visit: Payer: 59 | Admitting: Sports Medicine

## 2017-11-02 DIAGNOSIS — M25551 Pain in right hip: Secondary | ICD-10-CM | POA: Diagnosis not present

## 2017-11-02 NOTE — Assessment & Plan Note (Signed)
Right hip pain consistent with either possible labral tear and/or hip flexor/TFL tear.  Given failure of conservative management including physical therapy, intra-articular injection on 10/05/2017 and activity modifications she continues to have significant severe pain.  MR arthrogram of the right hip recommended and we will plan to follow-up with her after this is obtained.

## 2017-11-02 NOTE — Patient Instructions (Signed)
We are ordering an MRI for you today.  The imaging office will be calling you to schedule your appointment after we obtain authorization from your insurance company.   Please be sure you have signed up for MyChart so that we can get your results to you.  We will be in touch with you as soon as we can.  Please know, it can take up to 3-4 business days for the radiologist and Dr. Berline Chough to have time to review the results and determine the best appropriate action.  If there is something that appears to be surgical or needs a referral to other specialists we will let you know through MyChart or telephone.  Otherwise we will plan to schedule a follow up appointment with Dr. Berline Chough once we have the results.   [phone]

## 2017-11-02 NOTE — Progress Notes (Signed)
[name]. [name] [phone]  Dhruti Ghuman - 27 y.o. female MRN [id number]  Date of birth: 09-27-1990  Visit Date: 11/02/2017  PCP: Patient, No Pcp Per   Referred by: No ref. provider found   Scribe(s) for today's visit: Stevenson Clinch, CMA  SUBJECTIVE:  Danika Kluender is here for Follow-up (R hip pain)    HPI: Her R hip pain symptoms INITIALLY: Began several months ago but sx have started to flare-up more over the past few weeks. Sx started while doing therapy for her ankle. She is an avid runner. Described as mild-moderate tightness, can be sharp at times and achy at other times. Pain radiates into the R leg and at times into her back.  Worsened with abduction, jogging, stairs.  Improved with massage and stretching it.  Additional associated symptoms include: Hx of benign hypermobility syndrome. She has minor scoliosis. She reports some tightness in the L hip. Pain is mostly lateral, sometimes posterior, and other times anterior. Anterior pain radiates into the groin.     At this time symptoms are worsening compared to onset, sx are flaring up more over the past month.  She has not taken any medications for the pain. She has been doing PT. She has tried dry needling with minimal relief.   She has been seeing PT Dr. Hessie Diener. He sent her here.   No recent XR of hip or back.   11/02/2017: Compared to the last office visit on 10/05/17, her previously described R hip symptoms show no change  Current symptoms are mild-mod pain and tightness in her hip that can be sharp at times but achy at other times & are radiating to R groin She has not been doing the [employer].  She had a R hip steroid injection at her last visit which did not help or change her symptoms.  REVIEW OF SYSTEMS: Denies night time disturbances. Denies fevers, chills, or night sweats. Denies unexplained weight loss. Denies personal  history of cancer. Denies changes in bowel or bladder habits - pelvic floor dysphonia, relief with PT. Denies recent unreported falls. Denies new or worsening dyspnea or wheezing. Denies headaches or dizziness.  Denies numbness, tingling or weakness  In the extremities.  Denies dizziness or presyncopal episodes Reports lower extremity edema - recent R ankle surgery     HISTORY:  Prior history reviewed and updated per electronic medical record.  Social History   Occupational History  . Not on file  Tobacco Use  . Smoking status: Never Smoker  . Smokeless tobacco: Never Used  Substance and Sexual Activity  . Alcohol use: No    Alcohol/week: 0.0 standard drinks  . Drug use: No  . Sexual activity: Not on file   Social History   Social History Narrative  . Not on file     DATA OBTAINED & REVIEWED:  No results for input(s): HGBA1C, LABURIC, CREATINE in the last 8760 hours. . S/p Intra-articular injection 10/05/17 . Xray 10/05/17 - possible CAM deformity but otherwise normal XRay .   OBJECTIVE:  VS:  HT:5\' 5"  (165.1 cm)   WT:120 lb (54.4 kg)  BMI:19.97    BP:(!) 106/58  HR:81bpm  TEMP: ( )  RESP:97 %   PHYSICAL EXAM: CONSTITUTIONAL: Well-developed, Well-nourished and In no acute distress PSYCHIATRIC: Alert & appropriately interactive. and Not depressed or anxious appearing. RESPIRATORY: No increased work of breathing and Trachea Midline EYES: Pupils are equal., EOM  intact without nystagmus. and No scleral icterus.  VASCULAR EXAM: Warm and well perfused NEURO: unremarkable  MSK Exam: Right hip  Well aligned, no significant deformity. No overlying skin changes. TTP over anterior hip.  No pain over iliac crest   RANGE OF MOTION & STRENGTH  Normal, non-painful hip flexion, extension or IR   SPECIALITY TESTING:  mild pain with stinchfield  hip abduction strenght is normal,   no crepitation with axial load or cirumduction    ASSESSMENT   1. Pain of right hip  joint     PLAN:  Pertinent additional documentation may be included in corresponding procedure notes, imaging studies, problem based documentation and patient instructions.  Procedures:  . None  Medications:  No orders of the defined types were placed in this encounter.  Discussion/Instructions: Hip pain Right hip pain consistent with either possible labral tear and/or hip flexor/TFL tear.  Given failure of conservative management including physical therapy, intra-articular injection on 10/05/2017 and activity modifications she continues to have significant severe pain.  MR arthrogram of the right hip recommended and we will plan to follow-up with her after this is obtained.  . continue activity modifications. Okay to try elipitical . Discussed red flag symptoms that warrant earlier emergent evaluation and patient voices understanding. . Activity modifications and the importance of avoiding exacerbating activities (limiting pain to no more than a 4 / 10 during or following activity) recommended and discussed.  Follow-up:  . Return for MRI review in person.       CMA/ATC served as Neurosurgeon during this visit. History, Physical, and Plan performed by medical provider. Documentation and orders reviewed and attested to.      [name], DO    [facility]

## 2017-11-23 ENCOUNTER — Ambulatory Visit
Admission: RE | Admit: 2017-11-23 | Discharge: 2017-11-23 | Disposition: A | Discharge status: Home / Self Care | Payer: 59 | Source: Ambulatory Visit | Attending: Sports Medicine | Admitting: Sports Medicine

## 2017-11-23 DIAGNOSIS — M25551 Pain in right hip: Secondary | ICD-10-CM | POA: Diagnosis not present

## 2017-11-23 MED ORDER — IOPAMIDOL (ISOVUE-M 200) INJECTION 41%
20.0000 mL | Freq: Once | INTRAMUSCULAR | Status: AC
  Administered 2017-11-23: 20 mL via INTRA_ARTICULAR

## 2017-12-09 NOTE — Progress Notes (Signed)
Patient needs follow-up to go over these results. Ultimately there is not much that is definitive.

## 2017-12-12 NOTE — Progress Notes (Signed)
Called pt and she has been scheduled for 12/13/17 at 11:40 am

## 2017-12-13 ENCOUNTER — Ambulatory Visit (INDEPENDENT_AMBULATORY_CARE_PROVIDER_SITE_OTHER): Payer: 59 | Admitting: Sports Medicine

## 2017-12-13 ENCOUNTER — Encounter: Payer: Self-pay | Admitting: Sports Medicine

## 2017-12-13 VITALS — BP 104/68 | HR 64 | Ht 65.0 in | Wt 123.8 lb

## 2017-12-13 DIAGNOSIS — M25551 Pain in right hip: Secondary | ICD-10-CM

## 2017-12-13 NOTE — Patient Instructions (Addendum)

## 2017-12-13 NOTE — Progress Notes (Signed)
Brittany [name]. Brittany [name] at [facility] [phone]  Brittany [name] - 27 y.o. female MRN [id number]  Date of birth: 11/27/1990  Visit Date: 12/13/2017  PCP: Orland Mustard, MD   Referred by: No ref. provider found  SUBJECTIVE:   Chief Complaint  Patient presents with  . Follow-up    R hip pain - MRI review    HPI: Patient is here for follow-up of her right hip pain.  She has essentially stopped running due to this and has been limiting a lot of her day-to-day activities due to this right hip and groin pain and weakness.  She has reported some improvement with this.  She is here today to follow-up the MRI that she had on 11/23/2017.  REVIEW OF SYSTEMS: No significant nighttime awakenings due to this issue. Denies fevers, chills, recent weight gain or weight loss.  No night sweats.  Pt denies any change in bowel or bladder habits, muscle weakness, numbness or falls associated with this pain. Otherwise 12 point review of systems performed and is negative   HISTORY:  Prior history reviewed and updated per electronic medical record.  Patient Active Problem List   Diagnosis Date Noted  . GAD (generalized anxiety disorder) 12/16/2017  . Pelvic floor dysfunction 12/16/2017  . Right hip pain 11/02/2017    Followed by Dr. Berline Chough, seeing [name], PT for physical therapy X-rays of the hip were grossly unremarkable with questionable pincer deformity Intra-articular injection performed on 10/05/2017   . Grade 2 ankle sprain, right, initial encounter 05/16/2016  . Benign hypermobility syndrome 07/24/2015    Beighton score of 4   . 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 Right ankle pain 03/03/2015    MRI from [facility] shows tendinosis of the per. brevis and longus tendons posterior to the fibula without tears, there is  minimal surrounding tenosynovitis and mild reactive edema within the posterior lateral malleolus. There is also degenerative changes of the posterior talofibular ligament and calcaneofibular ligament. Evidence of old ankle fracture that's well-healed.  Review of XRay by me suggest that in addition to healed lat malleolar fx she probably has had a fracture at tip of medial maolleolus that has healed as well. Fits with recurrent history of sprains in her youth.    Social History   Occupational History  . Not on file  Tobacco Use  . Smoking status: Never Smoker  . Smokeless tobacco: Never Used  Substance and Sexual Activity  . Alcohol use: No    Alcohol/week: 0.0 standard drinks  . Drug use: No  . Sexual activity: Not on file   Social History   Social History Narrative  . Not on file    OBJECTIVE:  VS:  HT:5\' 5"  (165.1 cm)   WT:123 lb 12.8 oz (56.2 kg)  BMI:20.6    BP:104/68  HR:64bpm  TEMP: ( )  RESP:99 %   PHYSICAL EXAM: Adult female. No acute distress.  Alert and appropriate. Her left hip has improved hip flexion in a supine position she continues to have some weakness with this however.  Minimal pain at this time.  Her hip range of motion has improved with internal rotation and external rotation.    ASSESSMENT:   1. Pain of right hip joint   2. Right hip pain  PROCEDURES:  None  PLAN:  Pertinent additional documentation may be included in corresponding procedure notes, imaging studies, problem based documentation and patient instructions.  No problem-specific Assessment & Plan notes found for this encounter.  >50% of this 25 minute visit spent in direct patient counseling and/or coordination of care.  Discussion was focused on education regarding the in discussing the pathoetiology and anticipated clinical course of the above condition.  Ultimately her right hip continues to be significantly weak without focal findings on her MRI.  This may be coming from an  underlying pelvic floor and/or deep core muscle imbalance.  There is no indication of true pathologic findings on the pelvis MRI.  She should limit her activities based on pain but I am okay with her beginning to return to activities as she can tolerate I would like for her to work on hip flexor stretching as she has tight and I am able to reproduce a slight banjo stringing of the right hip flexors and a hyperextended position.  Links to [employer] videos provided today per Patient Instructions.  These exercises were developed by Myles LippsEric Goodman, DC with a strong emphasis on core neuromuscular reducation and postural realignment through body-weight exercises.   Discussed the underlying features of tight hip flexors leading to crouched, fetal like position that results in spinal column compression.  Including lumbar hyperflexion with hypermobility, thoracic flexion with restrictive rotation and cervical lordosis reversal.    Activity modifications and the importance of avoiding exacerbating activities (limiting pain to no more than a 4 / 10 during or following activity) recommended and discussed.  Discussed red flag symptoms that warrant earlier emergent evaluation and patient voices understanding.   No orders of the defined types were placed in this encounter.  Lab Orders  No laboratory test(s) ordered today   Imaging Orders  No imaging studies ordered today   Referral Orders  No referral(s) requested today    Return in about 6 weeks (around 01/24/2018).  Can consider osteopathic manipulation.         Andrena MewsMichael D Rigby, DO    [facility]

## 2017-12-16 ENCOUNTER — Encounter: Payer: Self-pay | Admitting: Family Medicine

## 2017-12-16 ENCOUNTER — Ambulatory Visit: Payer: 59 | Admitting: Family Medicine

## 2017-12-16 VITALS — BP 110/68 | HR 60 | Temp 98.3°F | Ht 65.0 in | Wt 120.8 lb

## 2017-12-16 DIAGNOSIS — M6289 Other specified disorders of muscle: Secondary | ICD-10-CM | POA: Diagnosis not present

## 2017-12-16 DIAGNOSIS — Z114 Encounter for screening for human immunodeficiency virus [HIV]: Secondary | ICD-10-CM | POA: Diagnosis not present

## 2017-12-16 DIAGNOSIS — F411 Generalized anxiety disorder: Secondary | ICD-10-CM

## 2017-12-16 DIAGNOSIS — Z0000 Encounter for general adult medical examination without abnormal findings: Secondary | ICD-10-CM | POA: Diagnosis not present

## 2017-12-16 LAB — TSH: TSH: 2.24 u[IU]/mL (ref 0.35–4.50)

## 2017-12-16 LAB — CBC WITH DIFFERENTIAL/PLATELET
Basophils Absolute: 0 10*3/uL (ref 0.0–0.1)
Basophils Relative: 0.9 % (ref 0.0–3.0)
Eosinophils Absolute: 0.5 10*3/uL (ref 0.0–0.7)
Eosinophils Relative: 12.2 % — ABNORMAL HIGH (ref 0.0–5.0)
HCT: 40 % (ref 36.0–46.0)
Hemoglobin: 13.4 g/dL (ref 12.0–15.0)
Lymphocytes Relative: 34.8 % (ref 12.0–46.0)
Lymphs Abs: 1.3 10*3/uL (ref 0.7–4.0)
MCHC: 33.6 g/dL (ref 30.0–36.0)
MCV: 92.8 fl (ref 78.0–100.0)
Monocytes Absolute: 0.4 10*3/uL (ref 0.1–1.0)
Monocytes Relative: 10.6 % (ref 3.0–12.0)
Neutro Abs: 1.6 10*3/uL (ref 1.4–7.7)
Neutrophils Relative %: 41.5 % — ABNORMAL LOW (ref 43.0–77.0)
Platelets: 199 10*3/uL (ref 150.0–400.0)
RBC: 4.31 Mil/uL (ref 3.87–5.11)
RDW: 13 % (ref 11.5–15.5)
WBC: 3.8 10*3/uL — ABNORMAL LOW (ref 4.0–10.5)

## 2017-12-16 LAB — COMPREHENSIVE METABOLIC PANEL
ALT: 11 U/L (ref 0–35)
AST: 12 U/L (ref 0–37)
Albumin: 4.6 g/dL (ref 3.5–5.2)
Alkaline Phosphatase: 42 U/L (ref 39–117)
BUN: 16 mg/dL (ref 6–23)
CO2: 26 mEq/L (ref 19–32)
Calcium: 9.7 mg/dL (ref 8.4–10.5)
Chloride: 106 mEq/L (ref 96–112)
Creatinine, Ser: 0.68 mg/dL (ref 0.40–1.20)
GFR: 110.12 mL/min (ref >60.00)
Glucose, Bld: 96 mg/dL (ref 70–99)
Potassium: 4.9 mEq/L (ref 3.5–5.1)
Sodium: 139 mEq/L (ref 135–145)
Total Bilirubin: 0.5 mg/dL (ref 0.2–1.2)
Total Protein: 7.1 g/dL (ref 6.0–8.3)

## 2017-12-16 LAB — LIPID PANEL
Cholesterol: 134 mg/dL (ref 0–200)
HDL: 58.7 mg/dL (ref >39.00)
LDL Cholesterol: 65 mg/dL (ref 0–99)
NonHDL: 75.71
Total CHOL/HDL Ratio: 2
Triglycerides: 53 mg/dL (ref 0.0–149.0)
VLDL: 10.6 mg/dL (ref 0.0–40.0)

## 2017-12-16 NOTE — Progress Notes (Signed)
Patient: Brittany [name] MRN: [id number] DOB: 16-Aug-1990 PCP: Patient, No Pcp Per     Subjective:  Chief Complaint  Patient presents with  . Establish Care    HPI: The patient is a 27 y.o. female who presents today for annual exam. She denies any changes to past medical history. There have been no recent hospitalizations. They are following a well balanced diet and exercise plan. Weight has been stable. No complaints today.   FH of diabetes in maternal grandparents. Mom is healthy-depression/anxiety  Dad is healthy Brother with thyroid/brother with depression.   GAD: she started taking zoloft at age 27 years of age. Diagnosed with anxiety at that time. 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 All of her issues stemmed from her OCD. She feels well controlled on her zoloft. No issues with eating now and OCD controlled.   There is no immunization history on file for this patient.  Pap smear: never had a pap smear due to pelvic floor. It is very tight.  Tdap: she thinks she is up to date Flu: declines  Review of Systems  Constitutional: Negative for chills, fatigue and fever.  HENT: Negative for dental problem, ear pain, hearing loss and trouble swallowing.   Eyes: Negative for visual disturbance.  Respiratory: Negative for cough, chest tightness and shortness of breath.   Cardiovascular: Negative for chest pain, palpitations and leg swelling.  Gastrointestinal: Negative for abdominal pain, blood in stool, constipation, diarrhea and nausea.  Endocrine: Negative for cold intolerance, polydipsia, polyphagia and polyuria.  Genitourinary: Negative for dysuria and hematuria.  Musculoskeletal: Positive for back pain and neck pain. Negative for arthralgias.       Followed by Dr. Berline Choughigby for this  Skin: Negative.  Negative for rash.  Neurological: Negative for dizziness and headaches.  Psychiatric/Behavioral: Negative for dysphoric mood and sleep disturbance. The patient is  not nervous/anxious.     Allergies Patient has No Known Allergies.  Past Medical History Patient  has a past medical history of Ankle injury.  Surgical History Patient  has a past surgical history that includes Wisdom tooth extraction.  Family History Pateint's family history is not on file.  Social History Patient  reports that she has never smoked. She has never used smokeless tobacco. She reports that she does not drink alcohol or use drugs.    Objective: Vitals:   12/16/17 0951  BP: 110/68  Pulse: 60  Temp: 98.3 F (36.8 C)  TempSrc: Oral  SpO2: 99%  Weight: 120 lb 12.8 oz (54.8 kg)  Height: 5\' 5"  (1.651 m)    Body mass index is 20.1 kg/m.  Physical Exam Vitals signs reviewed.  Constitutional:      Appearance: She is well-developed.  HENT:     Right Ear: External ear normal.     Left Ear: External ear normal.  Eyes:     Conjunctiva/sclera: Conjunctivae normal.     Pupils: Pupils are equal, round, and reactive to light.  Neck:     Musculoskeletal: Normal range of motion and neck supple.     Thyroid: No thyromegaly.  Cardiovascular:     Rate and Rhythm: Normal rate and regular rhythm.     Heart sounds: Normal heart sounds. No murmur.  Pulmonary:     Effort: Pulmonary effort is normal.     Breath sounds: Normal breath sounds.  Abdominal:     General: Bowel sounds are normal. There is no distension.     Palpations: Abdomen is soft.  Tenderness: There is no abdominal tenderness.  Lymphadenopathy:     Cervical: No cervical adenopathy.  Skin:    General: Skin is warm and dry.     Findings: No rash.  Neurological:     Mental Status: She is alert and oriented to person, place, and time.     Cranial Nerves: No cranial nerve deficit.     Coordination: Coordination normal.     Deep Tendon Reflexes: Reflexes normal.  Psychiatric:        Behavior: Behavior normal.        Assessment/plan: 1. Annual exam  Routine lab work today. Will come back for  attempted pap smear. Declines flu. Requesting records for her tdap/other physicians. Very fit- competetive runner.  Patient counseling [x]    Nutrition: Stressed importance of moderation in sodium/caffeine intake, saturated fat and cholesterol, caloric balance, sufficient intake of fresh fruits, vegetables, fiber, calcium, iron, and 1 mg of folate supplement per day (for females capable of pregnancy).  [x]    Stressed the importance of regular exercise.   []    Substance Abuse: Discussed cessation/primary prevention of tobacco, alcohol, or other drug use; driving or other dangerous activities under the influence; availability of treatment for abuse.   [x]    Injury prevention: Discussed safety belts, safety helmets, smoke detector, smoking near bedding or upholstery.   [x]    Sexuality: Discussed sexually transmitted diseases, partner selection, use of condoms, avoidance of unintended pregnancy  and contraceptive alternatives.  [x]    Dental health: Discussed importance of regular tooth brushing, flossing, and dental visits.  [x]    Health maintenance and immunizations reviewed. Please refer to Health maintenance section.    - CBC with Differential/Platelet - Comprehensive metabolic panel - Lipid panel - TSH - VITAMIN D 25 Hydroxy (Vit-D Deficiency, Fractures)  2. GAD (generalized anxiety disorder) Followed by Dr. Abbey Chatters in [place]. Well controlled Will take over her zoloft refills when needed. Requesting records.     Return in about 4 months (around 04/17/2018) for prn pap smear .    Orland Mustard, MD [facility]  12/16/2017

## 2017-12-16 NOTE — Patient Instructions (Signed)
Try flonase nightly x 2 weeks to see if helps mucous/drainage in the throat.   Postnasal Drip Postnasal drip is the feeling of mucus going down the back of your throat. Mucus is a slimy substance that moistens and cleans your nose and throat, as well as the air pockets in face bones near your forehead and cheeks (sinuses). Small amounts of mucus pass from your nose and sinuses down the back of your throat all the time. This is normal. When you produce too much mucus or the mucus gets too thick, you can feel it. Some common causes of postnasal drip include:  Having more mucus because of: ? A cold or the flu. ? Allergies. ? Cold air. ? Certain medicines.  Having more mucus that is thicker because of: ? A sinus or nasal infection. ? Dry air. ? A food allergy.  Follow these instructions at home: Relieving discomfort  Gargle with a salt-water mixture 3-4 times a day or as needed. To make a salt-water mixture, completely dissolve -1 tsp of salt in 1 cup of warm water.  If the air in your home is dry, use a humidifier to add moisture to the air.  Use a saline spray or container (neti pot) to flush out the nose (nasal irrigation). These methods can help clear away mucus and keep the nasal passages moist. General instructions  Take over-the-counter and prescription medicines only as told by your health care provider.  Follow instructions from your health care provider about eating or drinking restrictions. You may need to avoid caffeine.  Avoid things that you know you are allergic to (allergens), like dust, mold, pollen, pets, or certain foods.  Drink enough fluid to keep your urine pale yellow.  Keep all follow-up visits as told by your health care provider. This is important. Contact a health care provider if:  You have a fever.  You have a sore throat.  You have difficulty swallowing.  You have headache.  You have sinus pain.  You have a cough that does not go away.  The  mucus from your nose becomes thick and is green or yellow in color.  You have cold or flu symptoms that last more than 10 days. Summary  Postnasal drip is the feeling of mucus going down the back of your throat.  If your health care provider approves, use nasal irrigation or a nasal spray 2?4 times a day.  Avoid things that you know you are allergic to (allergens), like dust, mold, pollen, pets, or certain foods. This information is not intended to replace advice given to you by your health care provider. Make sure you discuss any questions you have with your health care provider. Document Released: 04/05/2016 Document Revised: 04/05/2016 Document Reviewed: 04/05/2016 Elsevier Interactive Patient Education  [employer]2018 Elsevier Inc.

## 2017-12-17 ENCOUNTER — Encounter: Payer: Self-pay | Admitting: Sports Medicine

## 2017-12-17 NOTE — Assessment & Plan Note (Signed)
Symptoms are consistent with likely underlying labral tear and markedly tight right hip flexors. Diagnostic and hopefully therapeutic injection performed today. Try to avoid exacerbating activities including squatting and rotational activities on one leg.

## 2018-01-24 ENCOUNTER — Ambulatory Visit: Payer: 59 | Admitting: Sports Medicine

## 2018-02-16 ENCOUNTER — Encounter: Payer: Self-pay | Admitting: Family Medicine

## 2018-02-16 DIAGNOSIS — F5001 Anorexia nervosa, restricting type: Secondary | ICD-10-CM | POA: Insufficient documentation

## 2018-02-16 DIAGNOSIS — Z8659 Personal history of other mental and behavioral disorders: Secondary | ICD-10-CM | POA: Insufficient documentation

## 2018-05-10 ENCOUNTER — Other Ambulatory Visit: Payer: Self-pay | Admitting: Family Medicine

## 2018-12-25 ENCOUNTER — Other Ambulatory Visit: Payer: Self-pay | Admitting: Family Medicine

## 2019-01-30 ENCOUNTER — Telehealth: Payer: Self-pay | Admitting: Family Medicine

## 2019-01-30 NOTE — Telephone Encounter (Signed)
Patient called in and wanted to see if Dr. Artis Flock would send in a referral for her to see  Doristine Counter M.D. at [facility]. Patient stated she seen her before and just needs a new referral sent in to see her.

## 2019-01-30 NOTE — Telephone Encounter (Signed)
Ok to start referral?  Message sent in my chart to let her know you are out of the office until Monday

## 2019-01-31 ENCOUNTER — Other Ambulatory Visit: Payer: Self-pay

## 2019-01-31 DIAGNOSIS — F411 Generalized anxiety disorder: Secondary | ICD-10-CM

## 2019-01-31 NOTE — Telephone Encounter (Signed)
Patient called and informed

## 2019-01-31 NOTE — Telephone Encounter (Signed)
Yes please put in referral.  Dx: depression I believe.  Orland Mustard, MD [place] [facility]

## 2019-01-31 NOTE — Telephone Encounter (Signed)
Referral started  

## 2019-02-05 ENCOUNTER — Telehealth: Payer: Self-pay

## 2019-02-05 NOTE — Telephone Encounter (Signed)
Patient is calling again regarding a referral. Patient states that she was told last Wednesday that Dr. Artis Flock already sent the referral.Patient would like for this referral to be sent off so she can go ahead and schedule her appt with  the Doctor. Name of the practice is [facility] in [place].

## 2019-02-05 NOTE — Telephone Encounter (Signed)
LVM for patient to call back about the status of her referral.

## 2019-02-06 NOTE — Telephone Encounter (Signed)
Called patient let her know I have spoke with the office yesterday when they called for clarification on referral. So I know they have referral she will call to set up app soon.

## 2019-03-20 ENCOUNTER — Encounter: Payer: Self-pay | Admitting: Physician Assistant

## 2019-03-20 ENCOUNTER — Ambulatory Visit (INDEPENDENT_AMBULATORY_CARE_PROVIDER_SITE_OTHER): Payer: 59

## 2019-03-20 ENCOUNTER — Encounter: Payer: Self-pay | Admitting: Family Medicine

## 2019-03-20 ENCOUNTER — Ambulatory Visit (INDEPENDENT_AMBULATORY_CARE_PROVIDER_SITE_OTHER): Payer: 59 | Admitting: Physician Assistant

## 2019-03-20 ENCOUNTER — Ambulatory Visit: Payer: 59 | Admitting: Family Medicine

## 2019-03-20 ENCOUNTER — Other Ambulatory Visit: Payer: Self-pay

## 2019-03-20 VITALS — BP 108/64 | HR 77 | Temp 97.5°F | Ht 65.0 in | Wt 121.2 lb

## 2019-03-20 VITALS — BP 108/64 | Ht 65.0 in | Wt 121.0 lb

## 2019-03-20 DIAGNOSIS — M79671 Pain in right foot: Secondary | ICD-10-CM | POA: Diagnosis not present

## 2019-03-20 NOTE — Patient Instructions (Signed)
Thank you for coming in today. Use the post op shoe as needed.  Use crutches as needed.  Rest the foot for a week or two. Cross training is ok.  Recheck in 1-2 weeks.  Make sure you are getting enough calcium and vit D and protein for bone health.  Vit D3 5000 units daily.  Calcium 1000 mg BID.

## 2019-03-20 NOTE — Patient Instructions (Signed)
It was great to see you!  A referral has been placed for you to see Dr. Evan Corey with [facility]. His location: [facility] at [place] [address] on [address] 1st floor.   Phone number [phone], Fax [phone].  This location is across the street from the entrance to [facility] and in the same complex as the [facility] and [facility]  Take care,  Samanthamarie Ezzell PA-C  

## 2019-03-20 NOTE — Progress Notes (Signed)
   I, Brittany [name], LAT, ATC, am serving as scribe for Dr. Clementeen Graham.  Brittany [name] is a 29 y.o. female who presents to [facility] today for R foot pain and swelling that began last night w/ no MOI.  She is having pain on the dorsal and plantar aspect of her R midfoot and is having pain w/ weight-bearing.  Pt is ambulating w/ B axillary crutches.  She rates her pain at mild currently at rest but can be severe w/ weight-bearing and describes her pain as aching and sharp.  Pt reports numbness/tingling in her R foot.  Pt has iced her foot and taken some IBU.  Hx right ankle surgery in 2018.   Pertinent review of systems: No fevers or chills.  Relevant historical information: History anorexia. History hypermobility syndrome.   Exam:  BP 108/64 (BP Location: Left Arm, Patient Position: Sitting, Cuff Size: Normal)   Ht 5\' 5"  (1.651 m)   Wt 121 lb (54.9 kg)   LMP 03/04/2019   BMI 20.14 kg/m   Wt Readings from Last 5 Encounters:  03/20/19 121 lb (54.9 kg)  03/20/19 121 lb 4 oz (55 kg)  12/16/17 120 lb 12.8 oz (54.8 kg)  12/13/17 123 lb 12.8 oz (56.2 kg)  11/02/17 120 lb (54.4 kg)    General: Well Developed, well nourished, and in no acute distress.   MSK: Right foot: Short Broadfoot with very high arch. Cavus appearing foot. Tender palpation dorsal midfoot and mild plantar lateral midfoot. Normal foot and ankle motion. Pulses cap refill and sensation intact distally.    Lab and Radiology Results No results found for this or any previous visit (from the past 72 hour(s)). DG Foot Complete Right  Result Date: 03/20/2019 CLINICAL DATA:  Right foot pain EXAM: RIGHT FOOT COMPLETE - 3+ VIEW COMPARISON:  None. FINDINGS: No fracture or malalignment. Possible soft tissue swelling adjacent to the head of fifth metatarsal. No radiopaque foreign body. IMPRESSION: No acute osseous abnormality Electronically Signed   By: 03/22/2019 M.D.   On: 03/20/2019  23:12  I, 03/22/2019, personally (independently) visualized and performed the interpretation of the images attached in this note.      Assessment and Plan: 29 y.o. female with right foot pain. Pain mostly dorsal midfoot. Suspect overuse injury or compression injury with ankle brace. Plan for relative rest with postop shoe and crutches as needed. Start vitamin D and calcium supplementation. Recheck back in about 10 days. Discussed modification of footwear and ankle brace to allow less compression across the dorsal foot. If not improving will proceed with MRI to further characterize cause of pain including looking for stress fracture. Also stressed the importance of adequate protein to heal bones etc.      Discussed warning signs or symptoms. Please see discharge instructions. Patient expresses understanding.   The above documentation has been reviewed and is accurate and complete 26

## 2019-03-20 NOTE — Progress Notes (Signed)
Brittany [name] is a 29 y.o. female here for a new problem.  I acted as a Education administrator for [employer], PA-C Anselmo Pickler, LPN  History of Present Illness:   Chief Complaint  Patient presents with  . Foot Pain    HPI   Foot pain Patient reports a significant history of right ankle issues.  She had right ankle surgery in 2018 due to multiple fractures and sprains.  She states that she has been cleared to do easy running, which for her is about 40 minutes at a slow pace on the Trail, almost daily.  She has been wearing a lace up brace in addition to her trail shoes.  She has difficulty finding shoes that fit her due to wide toe box but narrow heel.  She went trail running yesterday, came home and showered and had slight discomfort in her foot.Yesterday while she was cooking dinner she had a sudden worsening of pain in the middle of her R foot both on the top, and the bottom.  Area became so tender that she was no longer able to bear weight on it.  She used ice without relief.  She took an ibuprofen which finally took the edge off and was allowed her to sleep and eat.  She states that this morning and throughout the day today her pain is overall 50% improved.  Denies prior history of stress fracture to her knowledge.  She is currently on going pelvic floor therapy for pelvic floor issues that have caused issues with her hip.   Past Medical History:  Diagnosis Date  . Ankle injury   . Eating disorder   . Frequent headaches      Social History   Socioeconomic History  . Marital status: Married    Spouse name: Not on file  . Number of children: Not on file  . Years of education: Not on file  . Highest education level: Not on file  Occupational History  . Not on file  Tobacco Use  . Smoking status: Never Smoker  . Smokeless tobacco: Never Used  Substance and Sexual Activity  . Alcohol use: No    Alcohol/week: 0.0 standard drinks  . Drug use: No  . Sexual activity: Not on file   Other Topics Concern  . Not on file  Social History Narrative  . Not on file   Social Determinants of Health   Financial Resource Strain:   . Difficulty of Paying Living Expenses:   Food Insecurity:   . Worried About Charity fundraiser in the Last Year:   . Arboriculturist in the Last Year:   Transportation Needs:   . Film/video editor (Medical):   Marland Kitchen Lack of Transportation (Non-Medical):   Physical Activity:   . Days of Exercise per Week:   . Minutes of Exercise per Session:   Stress:   . Feeling of Stress :   Social Connections:   . Frequency of Communication with Friends and Family:   . Frequency of Social Gatherings with Friends and Family:   . Attends Religious Services:   . Active Member of Clubs or Organizations:   . Attends Archivist Meetings:   Marland Kitchen Marital Status:   Intimate Partner Violence:   . Fear of Current or Ex-Partner:   . Emotionally Abused:   Marland Kitchen Physically Abused:   . Sexually Abused:     Past Surgical History:  Procedure Laterality Date  . ANKLE SURGERY  2018  .  WISDOM TOOTH EXTRACTION      Family History  Problem Relation Age of Onset  . Depression Mother   . Depression Brother   . Diabetes Maternal Grandmother   . Hearing loss Maternal Grandmother   . Alcohol abuse Maternal Grandfather   . Depression Maternal Grandfather   . Diabetes Maternal Grandfather     No Known Allergies  Current Medications:   Current Outpatient Medications:  .  DULoxetine (CYMBALTA) 30 MG capsule, Take 30 mg by mouth daily., Disp: , Rfl:  .  gabapentin (NEURONTIN) 100 MG capsule, 2 caps  Po qam and 2 cap po qpm, Disp: , Rfl:  .  polyethylene glycol powder (GLYCOLAX/MIRALAX) 17 GM/SCOOP powder, Take by mouth., Disp: , Rfl:  .  sertraline (ZOLOFT) 100 MG tablet, Take 1 tablet (100 mg total) by mouth daily. OVERDUE FOR APPT> must be seen for further refills, Disp: 45 tablet, Rfl: 1   Review of Systems:   ROS  Negative unless otherwise specified  per HPI.   Vitals:   Vitals:   03/20/19 1342  BP: 108/64  Pulse: 77  Temp: (!) 97.5 F (36.4 C)  TempSrc: Temporal  SpO2: 96%  Weight: 121 lb 4 oz (55 kg)  Height: 5\' 5"  (1.651 m)     Body mass index is 20.18 kg/m.  Physical Exam:   Physical Exam Constitutional:      Appearance: She is well-developed.  HENT:     Head: Normocephalic and atraumatic.  Eyes:     Conjunctiva/sclera: Conjunctivae normal.  Pulmonary:     Effort: Pulmonary effort is normal.  Musculoskeletal:        General: Normal range of motion.     Cervical back: Normal range of motion and neck supple.     Comments: R foot: TTP on dorsal and plantar aspect of mid foot (with possible lesion/skin deformity at both areas?) Unable to comfortably bear weight on R foot No significant swelling appreciated Decreased resisted extension and flexion 2/2 pain   Skin:    General: Skin is warm and dry.  Neurological:     Mental Status: She is alert and oriented to person, place, and time.     Comments: Normal perceived sensation to R foot  Psychiatric:        Behavior: Behavior normal.        Thought Content: Thought content normal.        Judgment: Judgment normal.     Assessment and Plan:   Flannery was seen today for foot pain.  Diagnoses and all orders for this visit:  Right foot pain -     DG Foot Complete Right; Future -     Ambulatory referral to Sports Medicine   Possible stress fracture? X-ray reviewed -- no acute findings.  Patient would like referral to sports medicine, is able to schedule today.  Offered postop shoe or boot, however patient declined.  She has crutches and we wrapped her ankle and foot prior to her leaving the office.  . Reviewed expectations re: course of current medical issues. . Discussed self-management of symptoms. . Outlined signs and symptoms indicating need for more acute intervention. . Patient verbalized understanding and all questions were answered. . See orders  for this visit as documented in the electronic medical record. . Patient received an After-Visit Summary.  CMA or LPN served as scribe during this visit. History, Physical, and Plan performed by medical provider. The above documentation has been reviewed and is accurate and complete.  Inda Coke, PA-C

## 2019-03-28 ENCOUNTER — Telehealth: Payer: Self-pay | Admitting: Family Medicine

## 2019-03-28 ENCOUNTER — Other Ambulatory Visit: Payer: Self-pay

## 2019-03-28 ENCOUNTER — Ambulatory Visit: Payer: Self-pay

## 2019-03-28 ENCOUNTER — Encounter: Payer: Self-pay | Admitting: Family Medicine

## 2019-03-28 ENCOUNTER — Ambulatory Visit: Payer: 59 | Admitting: Family Medicine

## 2019-03-28 VITALS — BP 100/58 | HR 91 | Ht 65.0 in | Wt 126.0 lb

## 2019-03-28 DIAGNOSIS — M79671 Pain in right foot: Secondary | ICD-10-CM | POA: Diagnosis not present

## 2019-03-28 NOTE — Telephone Encounter (Signed)
Pt called, would like to cancel MRI at this time as she thinks she should give her injury more time.  Also, would like a letter for work. Requests it mention the nature of her injury, limitations, and how long it may take to be back to normal.  She does NOT use MyChart, please call her when letter is ready.

## 2019-03-28 NOTE — Patient Instructions (Signed)
Thank you for coming in today. Plan for MRI .  You should hear soon about scheduling.  Follow up with me following MRI.  Schedule with me a day or two after the MRI.   Continue post op shoe and weight bearing as tolerated.

## 2019-03-28 NOTE — Progress Notes (Signed)
I, Wendy Poet, LAT, ATC, am serving as scribe for Dr. Lynne Leader.  Brittany [name] is a 29 y.o. female who presents to [place] at [facility] today for f/u of R midfoot pain x approximately 10 days.  She was last seen by Dr. Georgina Snell on 03/20/19 and was provided a post-op shoe.  She was having so much pain w/ walking that she was using B axillary crutches.  Since her last visit, she reports that her R foot is feeling a little better.  She states that she has been icing every day and feels like the swelling has improved.  She con't to wear the post-op shoe and has been walking PWB w/ her B axillary crutches.  She states that her R foot pain location has moved and is now having the majority of her pain at her R lateral foot.    Diagnostic imaging: R foot XR- 03/20/19   Pertinent review of systems: No fevers or chills  Relevant historical information: History right lateral ankle surgery 2 years ago   Exam:  BP (!) 100/58 (BP Location: Left Arm, Patient Position: Sitting, Cuff Size: Normal)   Pulse 91   Ht 5\' 5"  (1.651 m)   Wt 126 lb (57.2 kg)   LMP 03/04/2019   SpO2 97%   BMI 20.97 kg/m  General: Well Developed, well nourished, and in no acute distress.   MSK: Right foot cavus appearing foot with no swelling or erythema today. Tender to palpation even to light touch to the lateral aspect of the foot near the proximal fifth metatarsal. Palpation along tendons in this region did not elicit severe pain. Normal foot and ankle motion however some pain with eversion. Patient has guarding with ligament stability testing nondiagnostic. Strength testing is painful but intact.    Lab and Radiology Results  DG Foot Complete Right  Result Date: 03/20/2019 CLINICAL DATA:  Right foot pain EXAM: RIGHT FOOT COMPLETE - 3+ VIEW COMPARISON:  None. FINDINGS: No fracture or malalignment. Possible soft tissue swelling adjacent to the head of fifth metatarsal. No radiopaque  foreign body. IMPRESSION: No acute osseous abnormality Electronically Signed   By: Donavan Foil M.D.   On: 03/20/2019 23:12   I, Lynne Leader, personally (independently) visualized and performed the interpretation of the images attached in this note.  Diagnostic Limited MSK Ultrasound of: Right lateral foot Fifth metatarsal visualized.  No obvious area of hypoechoic fluid or bony cortex defect. Peroneal brevis tendon visualized at insertion no significant abnormalities.  No significant joint effusion or bony abnormalities. Impression: Relatively normal lateral foot ultrasound     Assessment and Plan: 29 y.o. female with improving right lateral foot pain however still quite obnoxiously painful.  Patient is not able to bear weight fully even with a postop shoe.  At this point I am more concerned for a stress fracture given her failure to improve rapidly with typical conservative management.  Her pain is interfering with her quality of life including her ability to walk normally as well as her ability to exercise.  X-ray and ultrasound are nondiagnostic at this point.  After discussion with patient plan for MRI to evaluate for stress fracture.  Follow-up after MRI.  Informed patient that I will be out of the office next week.    Orders Placed This Encounter  Procedures  . [place] LIMITED JOINT SPACE STRUCTURES LOW RIGHT(NO LINKED CHARGES)    Order Specific Question:   Reason for Exam (SYMPTOM  OR DIAGNOSIS  REQUIRED)    Answer:   R foot pain    Order Specific Question:   Preferred imaging location?    Answer:   Adult nurse [facility]  . MR FOOT RIGHT WO CONTRAST    Standing Status:   Future    Standing Expiration Date:   05/27/2020    Order Specific Question:   ** REASON FOR EXAM (FREE TEXT)    Answer:   eval for stress fracture right foot worst at proximal 5th metatarsal    Order Specific Question:   What is the patient's sedation requirement?    Answer:   No Sedation    Order  Specific Question:   Does the patient have a pacemaker or implanted devices?    Answer:   No    Order Specific Question:   Preferred imaging location?    Answer:   Licensed conveyancer (table limit-350lbs)    Order Specific Question:   Radiology Contrast Protocol - do NOT remove file path    Answer:   [email or web address]   No orders of the defined types were placed in this encounter.    Discussed warning signs or symptoms. Please see discharge instructions. Patient expresses understanding.   The above documentation has been reviewed and is accurate and complete Clementeen Graham

## 2019-03-30 ENCOUNTER — Encounter: Payer: Self-pay | Admitting: Family Medicine

## 2019-03-30 NOTE — Telephone Encounter (Signed)
Called Brittany [name] and LM w/ all of Dr. Zollie Pee advice and advised that her letter is ready for pick-up.

## 2019-03-30 NOTE — Telephone Encounter (Signed)
When they call to schedule the MRI please tell them you like to delay it or cancel it.  I recommend that you schedule the MRI for a month into the future and we can always cancel it closer to the time if you get better.  Additionally I have written a letter which is ready for pickup now.

## 2019-04-02 ENCOUNTER — Encounter: Payer: Self-pay | Admitting: Family Medicine

## 2019-04-02 ENCOUNTER — Ambulatory Visit (INDEPENDENT_AMBULATORY_CARE_PROVIDER_SITE_OTHER): Payer: 59 | Admitting: Family Medicine

## 2019-04-02 ENCOUNTER — Other Ambulatory Visit: Payer: Self-pay

## 2019-04-02 VITALS — BP 96/60 | HR 62 | Temp 97.8°F | Ht 65.0 in | Wt 120.8 lb

## 2019-04-02 DIAGNOSIS — F411 Generalized anxiety disorder: Secondary | ICD-10-CM | POA: Diagnosis not present

## 2019-04-02 DIAGNOSIS — Z0000 Encounter for general adult medical examination without abnormal findings: Secondary | ICD-10-CM | POA: Diagnosis not present

## 2019-04-02 DIAGNOSIS — Z23 Encounter for immunization: Secondary | ICD-10-CM

## 2019-04-02 LAB — COMPREHENSIVE METABOLIC PANEL
ALT: 14 U/L (ref 0–35)
AST: 16 U/L (ref 0–37)
Albumin: 4.7 g/dL (ref 3.5–5.2)
Alkaline Phosphatase: 49 U/L (ref 39–117)
BUN: 13 mg/dL (ref 6–23)
CO2: 26 mEq/L (ref 19–32)
Calcium: 9.7 mg/dL (ref 8.4–10.5)
Chloride: 105 mEq/L (ref 96–112)
Creatinine, Ser: 0.62 mg/dL (ref 0.40–1.20)
GFR: 114.18 mL/min (ref >60.00)
Glucose, Bld: 90 mg/dL (ref 70–99)
Potassium: 4.3 mEq/L (ref 3.5–5.1)
Sodium: 138 mEq/L (ref 135–145)
Total Bilirubin: 0.4 mg/dL (ref 0.2–1.2)
Total Protein: 7.5 g/dL (ref 6.0–8.3)

## 2019-04-02 LAB — CBC WITH DIFFERENTIAL/PLATELET
Basophils Absolute: 0 10*3/uL (ref 0.0–0.1)
Basophils Relative: 0.9 % (ref 0.0–3.0)
Eosinophils Absolute: 0.4 10*3/uL (ref 0.0–0.7)
Eosinophils Relative: 7.9 % — ABNORMAL HIGH (ref 0.0–5.0)
HCT: 44.3 % (ref 36.0–46.0)
Hemoglobin: 15 g/dL (ref 12.0–15.0)
Lymphocytes Relative: 32.6 % (ref 12.0–46.0)
Lymphs Abs: 1.5 10*3/uL (ref 0.7–4.0)
MCHC: 33.9 g/dL (ref 30.0–36.0)
MCV: 92.2 fl (ref 78.0–100.0)
Monocytes Absolute: 0.5 10*3/uL (ref 0.1–1.0)
Monocytes Relative: 10 % (ref 3.0–12.0)
Neutro Abs: 2.2 10*3/uL (ref 1.4–7.7)
Neutrophils Relative %: 48.6 % (ref 43.0–77.0)
Platelets: 203 10*3/uL (ref 150.0–400.0)
RBC: 4.8 Mil/uL (ref 3.87–5.11)
RDW: 12.5 % (ref 11.5–15.5)
WBC: 4.6 10*3/uL (ref 4.0–10.5)

## 2019-04-02 LAB — LIPID PANEL
Cholesterol: 141 mg/dL (ref 0–200)
HDL: 53.7 mg/dL (ref >39.00)
LDL Cholesterol: 77 mg/dL (ref 0–99)
NonHDL: 87.14
Total CHOL/HDL Ratio: 3
Triglycerides: 49 mg/dL (ref 0.0–149.0)
VLDL: 9.8 mg/dL (ref 0.0–40.0)

## 2019-04-02 LAB — VITAMIN D 25 HYDROXY (VIT D DEFICIENCY, FRACTURES): VITD: 62.73 ng/mL (ref 30.00–100.00)

## 2019-04-02 LAB — TSH: TSH: 2.86 u[IU]/mL (ref 0.35–4.50)

## 2019-04-02 NOTE — Patient Instructions (Signed)
Come back for your tetanus shot 4 weeks after 2nd covid shot. Can just do a nurse visit.  Need pap smear, so schedule appointment once you feel like you're making progress with your pelvic pt.   So good to see you!  Dr. Rogers Blocker   Preventive Care 55-29 Years Old, Female Preventive care refers to visits with your health care provider and lifestyle choices that can promote health and wellness. This includes:  A yearly physical exam. This may also be called an annual well check.  Regular dental visits and eye exams.  Immunizations.  Screening for certain conditions.  Healthy lifestyle choices, such as eating a healthy diet, getting regular exercise, not using drugs or products that contain nicotine and tobacco, and limiting alcohol use. What can I expect for my preventive care visit? Physical exam Your health care provider will check your:  Height and weight. This may be used to calculate body mass index (BMI), which tells if you are at a healthy weight.  Heart rate and blood pressure.  Skin for abnormal spots. Counseling Your health care provider may ask you questions about your:  Alcohol, tobacco, and drug use.  Emotional well-being.  Home and relationship well-being.  Sexual activity.  Eating habits.  Work and work Statistician.  Method of birth control.  Menstrual cycle.  Pregnancy history. What immunizations do I need?  Influenza (flu) vaccine  This is recommended every year. Tetanus, diphtheria, and pertussis (Tdap) vaccine  You may need a Td booster every 10 years. Varicella (chickenpox) vaccine  You may need this if you have not been vaccinated. Human papillomavirus (HPV) vaccine  If recommended by your health care provider, you may need three doses over 6 months. Measles, mumps, and rubella (MMR) vaccine  You may need at least one dose of MMR. You may also need a second dose. Meningococcal conjugate (MenACWY) vaccine  One dose is recommended if you  are age 81-21 years and a first-year college student living in a residence hall, or if you have one of several medical conditions. You may also need additional booster doses. Pneumococcal conjugate (PCV13) vaccine  You may need this if you have certain conditions and were not previously vaccinated. Pneumococcal polysaccharide (PPSV23) vaccine  You may need one or two doses if you smoke cigarettes or if you have certain conditions. Hepatitis A vaccine  You may need this if you have certain conditions or if you travel or work in places where you may be exposed to hepatitis A. Hepatitis B vaccine  You may need this if you have certain conditions or if you travel or work in places where you may be exposed to hepatitis B. Haemophilus influenzae type b (Hib) vaccine  You may need this if you have certain conditions. You may receive vaccines as individual doses or as more than one vaccine together in one shot (combination vaccines). Talk with your health care provider about the risks and benefits of combination vaccines. What tests do I need?  Blood tests  Lipid and cholesterol levels. These may be checked every 5 years starting at age 26.  Hepatitis C test.  Hepatitis B test. Screening  Diabetes screening. This is done by checking your blood sugar (glucose) after you have not eaten for a while (fasting).  Sexually transmitted disease (STD) testing.  BRCA-related cancer screening. This may be done if you have a family history of breast, ovarian, tubal, or peritoneal cancers.  Pelvic exam and Pap test. This may be done every 3  years starting at age 28. Starting at age 35, this may be done every 5 years if you have a Pap test in combination with an HPV test. Talk with your health care provider about your test results, treatment options, and if necessary, the need for more tests. Follow these instructions at home: Eating and drinking   Eat a diet that includes fresh fruits and  vegetables, whole grains, lean protein, and low-fat dairy.  Take vitamin and mineral supplements as recommended by your health care provider.  Do not drink alcohol if: ? Your health care provider tells you not to drink. ? You are pregnant, may be pregnant, or are planning to become pregnant.  If you drink alcohol: ? Limit how much you have to 0-1 drink a day. ? Be aware of how much alcohol is in your drink. In the [place] one drink equals one 12 oz bottle of beer (355 mL), one 5 oz glass of wine (148 mL), or one 1 oz glass of hard liquor (44 mL). Lifestyle  Take daily care of your teeth and gums.  Stay active. Exercise for at least 30 minutes on 5 or more days each week.  Do not use any products that contain nicotine or tobacco, such as cigarettes, e-cigarettes, and chewing tobacco. If you need help quitting, ask your health care provider.  If you are sexually active, practice safe sex. Use a condom or other form of birth control (contraception) in order to prevent pregnancy and STIs (sexually transmitted infections). If you plan to become pregnant, see your health care provider for a preconception visit. What's next?  Visit your health care provider once a year for a well check visit.  Ask your health care provider how often you should have your eyes and teeth checked.  Stay up to date on all vaccines. This information is not intended to replace advice given to you by your health care provider. Make sure you discuss any questions you have with your health care provider. Document Revised: 09/01/2017 Document Reviewed: 09/01/2017 Elsevier Patient Education  2020 [employer].

## 2019-04-02 NOTE — Progress Notes (Signed)
Patient: Brittany [name] MRN: [id number] DOB: 11/13/1990 PCP: Orma Flaming, MD     Subjective:  Chief Complaint  Patient presents with  . Annual Exam    HPI: The patient is a 29 y.o. female who presents today for annual exam. She denies any changes to past medical history. There have been no recent hospitalizations. They are following a well balanced diet and exercise plan. Weight has been stable. No complaints today. She just had her first covid shot last week. Her second one is in April. She does need tetanus. Followed by Dr. Georgina Snell for her foot issue.   She has no family history of colon or breast cancer in first degree relative.   GAD: she started taking zoloft at age 7 years of age. Diagnosed with anxiety at that time. 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 All of her issues stemmed from her OCD. She is currently on cymbalta and was changed from her zoloft to this. She states this was just changed so she isn't sure if this helping much. She feels like her anxiety has gotten better in the past 3 weeks. She does have follow up with her psychiatry in two days.   Immunization History  Administered Date(s) Administered  . PFIZER SARS-COV-2 Vaccination 03/29/2019    Colonoscopy: routine screening  Mammogram: routine screening  Pap smear: never has had this. Wants to work on her pelvic floor     Review of Systems  Constitutional: Negative for chills, fatigue and fever.  HENT: Negative for dental problem, ear pain, hearing loss, sinus pressure, sinus pain, sore throat and trouble swallowing.   Eyes: Negative for visual disturbance.  Respiratory: Negative for cough, chest tightness, shortness of breath and wheezing.   Cardiovascular: Negative for chest pain, palpitations and leg swelling.  Gastrointestinal: Negative for abdominal pain, blood in stool, diarrhea and nausea.  Endocrine: Negative for cold intolerance, polydipsia, polyphagia and polyuria.  Genitourinary:  Positive for difficulty urinating and pelvic pain. Negative for dysuria and hematuria.  Musculoskeletal: Negative for arthralgias.  Skin: Negative for rash.  Neurological: Negative for dizziness, light-headedness and headaches.  Psychiatric/Behavioral: Negative for dysphoric mood and sleep disturbance. The patient is not nervous/anxious.     Allergies Patient has No Known Allergies.  Past Medical History Patient  has a past medical history of Ankle injury, Eating disorder, and Frequent headaches.  Surgical History Patient  has a past surgical history that includes Wisdom tooth extraction and Ankle surgery (2018).  Family History Pateint's family history includes Alcohol abuse in her maternal grandfather; Depression in her brother, maternal grandfather, and mother; Diabetes in her maternal grandfather and maternal grandmother; Hearing loss in her maternal grandmother.  Social History Patient  reports that she has never smoked. She has never used smokeless tobacco. She reports that she does not drink alcohol or use drugs.    Objective: Vitals:   04/02/19 0832  BP: 96/60  Pulse: 62  Temp: 97.8 F (36.6 C)  TempSrc: Temporal  SpO2: 99%  Weight: 120 lb 12.8 oz (54.8 kg)  Height: 5\' 5"  (1.651 m)    Body mass index is 20.1 kg/m.  Physical Exam Vitals reviewed.  Constitutional:      Appearance: Normal appearance. She is well-developed and normal weight.  HENT:     Head: Normocephalic and atraumatic.     Right Ear: Tympanic membrane, ear canal and external ear normal.     Left Ear: Tympanic membrane, ear canal and external ear normal.     Nose: Nose  normal.     Mouth/Throat:     Mouth: Mucous membranes are moist.  Eyes:     Extraocular Movements: Extraocular movements intact.     Conjunctiva/sclera: Conjunctivae normal.     Pupils: Pupils are equal, round, and reactive to light.  Neck:     Thyroid: No thyromegaly.  Cardiovascular:     Rate and Rhythm: Normal rate and  regular rhythm.     Pulses: Normal pulses.     Heart sounds: Normal heart sounds. No murmur.  Pulmonary:     Effort: Pulmonary effort is normal.     Breath sounds: Normal breath sounds.  Abdominal:     General: Abdomen is flat. Bowel sounds are normal. There is no distension.     Palpations: Abdomen is soft.     Tenderness: There is no abdominal tenderness.  Musculoskeletal:     Cervical back: Normal range of motion and neck supple.     Comments: Open boot on right foot   Lymphadenopathy:     Cervical: No cervical adenopathy.  Skin:    General: Skin is warm and dry.     Findings: No rash.  Neurological:     Mental Status: She is alert and oriented to person, place, and time.     Cranial Nerves: No cranial nerve deficit.     Coordination: Coordination normal.     Deep Tendon Reflexes: Reflexes normal.  Psychiatric:        Behavior: Behavior normal.      Office Visit from 04/02/2019 in [facility]  PHQ-2 Total Score  1     GAD 7 : Generalized Anxiety Score 04/02/2019  Nervous, Anxious, on Edge 3  Control/stop worrying 3  Worry too much - different things 3  Trouble relaxing 3  Restless 1  Easily annoyed or irritable 2  Afraid - awful might happen 1  Total GAD 7 Score 16  Anxiety Difficulty Very difficult         Assessment/plan: 1. Annual physical exam Routine fasting labs today. Discussed she needs pap smear and would not advise putting off too long. We had this conversation in 2019 and she has a lot of fear/anxiety due to her pelvic floor dysfunction. If she is making progress advised she return this year to have this done. Will come back for tdap after 4 weeks of last covid shot. Otherwise HM utd. Very healthy. Continue diet/exercise when able. F/u for pap smear.  Patient counseling [x]    Nutrition: Stressed importance of moderation in sodium/caffeine intake, saturated fat and cholesterol, caloric balance, sufficient intake of fresh fruits,  vegetables, fiber, calcium, iron, and 1 mg of folate supplement per day (for females capable of pregnancy).  [x]    Stressed the importance of regular exercise.   []    Substance Abuse: Discussed cessation/primary prevention of tobacco, alcohol, or other drug use; driving or other dangerous activities under the influence; availability of treatment for abuse.   [x]    Injury prevention: Discussed safety belts, safety helmets, smoke detector, smoking near bedding or upholstery.   [x]    Sexuality: Discussed sexually transmitted diseases, partner selection, use of condoms, avoidance of unintended pregnancy  and contraceptive alternatives.  [x]    Dental health: Discussed importance of regular tooth brushing, flossing, and dental visits.  [x]    Health maintenance and immunizations reviewed. Please refer to Health maintenance section.    - CBC with Differential/Platelet - Comprehensive metabolic panel - Lipid panel - TSH - VITAMIN D 25 Hydroxy (Vit-D  Deficiency, Fractures)  2. GAD (generalized anxiety disorder) Not well controlled at all. Her psychiatrist has been adjusting medication and she has follow up with her in 2 days.   3. Need for Tdap vaccination Can not do at this time due to Covid vaccinations. Discussed to come back in for nurse visit 3-4 weeks following 2nd covid vaccine.   This visit occurred during the SARS-CoV-2 public health emergency.  Safety protocols were in place, including screening questions prior to the visit, additional usage of staff PPE, and extensive cleaning of exam room while observing appropriate contact time as indicated for disinfecting solutions.    Return if symptoms worsen or fail to improve, for pap smear. Orland Mustard, MD [facility]  04/02/2019

## 2019-04-09 ENCOUNTER — Encounter: Payer: Self-pay | Admitting: Family Medicine

## 2019-04-16 ENCOUNTER — Other Ambulatory Visit: Payer: Self-pay

## 2019-04-16 ENCOUNTER — Encounter: Payer: Self-pay | Admitting: Family Medicine

## 2019-04-16 ENCOUNTER — Ambulatory Visit: Payer: 59 | Admitting: Family Medicine

## 2019-04-16 VITALS — BP 100/68 | HR 83 | Ht 65.0 in | Wt 121.0 lb

## 2019-04-16 DIAGNOSIS — Q667 Congenital pes cavus, unspecified foot: Secondary | ICD-10-CM

## 2019-04-16 DIAGNOSIS — M79671 Pain in right foot: Secondary | ICD-10-CM | POA: Diagnosis not present

## 2019-04-16 NOTE — Patient Instructions (Addendum)
Thank you for coming in today. Plan for my office to investigate MRI.  Expect a call soon about an appointment.  Direct number to radiology scheduling at [facility] is [phone].  You should hear something this week.  Let me know if there is a problem.   Follow up after MRI.

## 2019-04-16 NOTE — Progress Notes (Signed)
fo        I, Christoper Fabian, LAT, ATC, am serving as scribe for Dr. Clementeen Graham.  Brittany [name] is a 29 y.o. female who presents to [facility] today for f/u of R foot pain.  She was last seen by Dr. Denyse Amass on 03/28/19 for f/u and had slightly improved R foot pain.  She was still wearing her post-op shoe and was walking PWB w/ her B axillary crutches.  An MRI of her R foot was ordered but then the pt decided she didn't want to proceed w/ the MRI after she left.  Since her last appt, pt notes that overall her R foot is feeling better unless she walks too much or on uneven surfaces.  She locates her pain to her R lateral foot.  She denies any current swelling but will intermittently swell.  She will walk w/ a single axillary crutch if she walks too far or for too long.  She notes she is not been able to return to work yet.  Her employer does not want her to work until she is able to walk without a postop shoe or crutches.  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  History anorexia nervosa.  Currently in remission   Exam:  BP 100/68 (BP Location: Left Arm, Patient Position: Sitting, Cuff Size: Normal)   Pulse 83   Ht 5\' 5"  (1.651 m)   Wt 121 lb (54.9 kg)   SpO2 97%   BMI 20.14 kg/m  General: Well Developed, well nourished, and in no acute distress.   MSK: Right foot: Cavus appearing foot with broad forefoot.  Tender palpation mildly along fourth and fifth metatarsal shafts and lateral tarsometatarsal joints.      Assessment and Plan: 29 y.o. female with right foot pain.  Overall improved but not sufficiently improved.  She is taking longer to get better than I would expect.  At this point given that she is still disabled and unable to work I think she should proceed with MRI.  We have confirmed that all she has  to do is call [facility] to schedule MRI.  She should call and follow-up with me after MRI completed.  We will discuss ability to return to work and next steps at that visit.    Discussed warning signs or symptoms. Please see discharge instructions. Patient expresses understanding.   The above documentation has been reviewed and is accurate and complete 26   Total encounter time 20 minutes including charting time date of service. Next steps and possible diagnoses

## 2019-04-23 ENCOUNTER — Ambulatory Visit (INDEPENDENT_AMBULATORY_CARE_PROVIDER_SITE_OTHER): Payer: 59

## 2019-04-23 ENCOUNTER — Other Ambulatory Visit: Payer: Self-pay

## 2019-04-23 DIAGNOSIS — M79671 Pain in right foot: Secondary | ICD-10-CM | POA: Diagnosis not present

## 2019-04-24 ENCOUNTER — Encounter: Payer: Self-pay | Admitting: Family Medicine

## 2019-04-24 ENCOUNTER — Ambulatory Visit: Payer: 59 | Admitting: Family Medicine

## 2019-04-24 VITALS — BP 100/64 | HR 88 | Ht 65.0 in | Wt 119.2 lb

## 2019-04-24 DIAGNOSIS — Q667 Congenital pes cavus, unspecified foot: Secondary | ICD-10-CM | POA: Diagnosis not present

## 2019-04-24 DIAGNOSIS — M79671 Pain in right foot: Secondary | ICD-10-CM

## 2019-04-24 NOTE — Progress Notes (Signed)
MRI foot does not show stress fracture.  It does show atrophy of the muscles of the foot.  We will discuss this during follow-up on the 20th

## 2019-04-24 NOTE — Patient Instructions (Addendum)
Thank you for coming in today. Work on home exercises for intrinsic foot muscle strength.  Restart your home exercises for ankle strength.  Let me know if you want me to refer to PT.  I will do some research.  OK to wean out of post op shoe.   Please perform the exercise program that we have prepared for you and gone over in detail on a daily basis.  In addition to the handout you were provided you can access your program through: [email or web address]   Your unique program code is:  [email or web address]

## 2019-04-24 NOTE — Progress Notes (Signed)
I, Wendy Poet, LAT, ATC, am serving as scribe for Dr. Lynne Leader.  Brittany [name] is a 29 y.o. female who presents to [place] at [facility] today for f/u of  R lateral foot pain and R foot MRI review.  She was last seen by Dr.Pheonix Clinkscale on 04/16/19 and was referred for a R foot MRI.  At her last visit, she con't to wear her post-op shoe and would use a single axillary crutch as needed.  Since her last visit, pt reports that her R foot is feeling better.  She notes that she con't to get a pinching-type pain at the base of her 5th MT.  Diagnostic testing: R foot XR- 03/20/19; R foot MRI- 04/23/19  Pertinent review of systems: No fevers or chills  Relevant historical information: History benign hypermobility syndrome with Beighton score 4 and history of ankle sprain and pelvic floor dysfunction.   Exam:  BP 100/64 (BP Location: Left Arm, Patient Position: Sitting, Cuff Size: Normal)   Pulse 88   Ht 5\' 5"  (1.651 m)   Wt 119 lb 3.2 oz (54.1 kg)   SpO2 98%   BMI 19.84 kg/m  General: Well Developed, well nourished, and in no acute distress.   MSK: Right foot cavus appearing foot relatively normal-appearing otherwise.  Mildly tender lateral and plantar aspect of base of fifth metatarsal.    Lab and Radiology Results No results found for this or any previous visit (from the past 72 hour(s)). MR FOOT RIGHT WO CONTRAST  Result Date: 04/24/2019 CLINICAL DATA:  Right foot pain. Clinical concern for fifth metatarsal stress fracture. EXAM: MRI OF THE RIGHT FOREFOOT WITHOUT CONTRAST TECHNIQUE: Multiplanar, multisequence MR imaging of the ankle was performed. No intravenous contrast was administered. COMPARISON:  X-ray 03/20/2019 done FINDINGS: Bones/Joint/Cartilage No acute fracture. No malalignment. Degenerative changes of the first MTP joint. There is some heterogeneity of the marrow signal of the forefoot which may be related to disuse demineralization. T2 hyperintense  appearance most pronounced at the distal phalanxes of the great toe third digit, which are favored to be related to poor fat saturation at the edge of the field of view. No cortical thickening or periosteal edema to suggest stress fracture. No joint effusion. Ligaments Intact Lisfranc ligament. Intact intrinsic foot ligaments. Intact plantar plates. Muscles and Tendons Mild diffuse atrophy and fatty infiltration of the intrinsic foot musculature with mild edema like intramuscular signal. Intact flexor and extensor tendons. No tenosynovial fluid collection. Soft tissues No soft tissue fluid collection or hematoma. IMPRESSION: 1. No acute osseous abnormality. No evidence of metatarsal stress fracture. 2. Mild diffuse atrophy and fatty infiltration of the intrinsic foot musculature with mild edema-like intramuscular signal. Findings are nonspecific but can be seen in the setting of neuropathy or disuse. 3. Mild degenerative changes of the first MTP joint. Electronically Signed   By: Davina Poke D.O.   On: 04/24/2019 10:21   I, Lynne Leader, personally (independently) visualized and performed the interpretation of the images attached in this note.   Assessment and Plan: 29 y.o. female with right foot pain.  MRI was a bit surprising.  Did not show stress fracture or ligament or tendon injury.  She does have intrinsic foot muscle atrophy which may be the fundamental online problem causing pain.  Its not clear if she ever did have a stress fracture or stress reaction but certainly she does not now.  Plan to immediately wean out of postop shoe and into a more  conventional footwear.  Plan for home exercise program which focused on intrinsic foot muscle strengthening and stretching as well as ankle musculature stretching and strengthening.  Advance activity as tolerated.  Could proceed with referral to physical therapy as needed however patient would like to try it on her own first.  Additionally discussed  possibility of second opinion if she would like.  Could make some recommendations as needed.  Patient will see me an update with how she is feeling.      Discussed warning signs or symptoms. Please see discharge instructions. Patient expresses understanding.   The above documentation has been reviewed and is accurate and complete Clementeen Graham   Total encounter time 20 minutes including charting time date of service. Discussed MRI findings.  Also spent time teaching home exercise program for intrinsic foot musculature.

## 2019-05-08 ENCOUNTER — Encounter: Payer: Self-pay | Admitting: Family Medicine

## 2019-05-08 ENCOUNTER — Other Ambulatory Visit: Payer: Self-pay

## 2019-05-08 ENCOUNTER — Ambulatory Visit: Payer: 59 | Admitting: Family Medicine

## 2019-05-08 VITALS — BP 100/62 | HR 77 | Ht 65.0 in | Wt 119.0 lb

## 2019-05-08 DIAGNOSIS — M79671 Pain in right foot: Secondary | ICD-10-CM | POA: Diagnosis not present

## 2019-05-08 DIAGNOSIS — Q667 Congenital pes cavus, unspecified foot: Secondary | ICD-10-CM

## 2019-05-08 NOTE — Progress Notes (Signed)
Brittany [name] is a 29 y.o. female who presents to [place] at [facility] today for f/u of R lateral foot pain.  She was last seen by Dr. Georgina Snell on 04/24/19 for a R foot MRI review and noted improvement in her R foot pain.  She was advised to wean out of her post-op shoe per pt comfort and was provided a HEP focusing on R ankle strengthening.  Since her last visit, pt reports that she continues to have pain with shoe wear. Pain over the plantar aspect of foot over metatarsal heads. Feels a cramping sensation in her toes. Has not been wearing post-op shoe since last appointment. Reports pain has "squeezing" sensation.  She notes discomfort and difficulty in pain especially in the lateral fifth metatarsal while wearing shoes.  She is unable to tolerate much pressure on the lateral aspect of her foot.  Diagnostic testing: R foot XR- 03/20/19; R foot MRI- 04/23/19  Pertinent review of systems: No fevers or chills  Relevant historical information: Currently taking gabapentin prescribed by psychiatry.  200 mg daily.  Also taking Cymbalta 60 mg daily and Adderall low-dose extended release.   Exam:  BP 100/62   Pulse 77   Ht 5\' 5"  (1.651 m)   Wt 119 lb (54 kg)   SpO2 96%   BMI 19.80 kg/m  General: Well Developed, well nourished, and in no acute distress.   MSK: Right foot broaden forefoot.  Patient has a flattened transverse arch.  Longitudinal arch is raised and a cavus foot appearance no skin erythema.  Tender to light to moderate touch with MTP area. Not radicular tender in the mid metatarsal heads.    Lab and Radiology Results  EXAM: MRI OF THE RIGHT FOREFOOT WITHOUT CONTRAST  TECHNIQUE: Multiplanar, multisequence MR imaging of the ankle was performed. No intravenous contrast was administered.  COMPARISON:  X-ray 03/20/2019 done  FINDINGS: Bones/Joint/Cartilage  No acute fracture. No malalignment. Degenerative changes of the first MTP joint.  There is some heterogeneity of the marrow signal of the forefoot which may be related to disuse demineralization. T2 hyperintense appearance most pronounced at the distal phalanxes of the great toe third digit, which are favored to be related to poor fat saturation at the edge of the field of view. No cortical thickening or periosteal edema to suggest stress fracture. No joint effusion.  Ligaments  Intact Lisfranc ligament. Intact intrinsic foot ligaments. Intact plantar plates.  Muscles and Tendons  Mild diffuse atrophy and fatty infiltration of the intrinsic foot musculature with mild edema like intramuscular signal. Intact flexor and extensor tendons. No tenosynovial fluid collection.  Soft tissues  No soft tissue fluid collection or hematoma.  IMPRESSION: 1. No acute osseous abnormality. No evidence of metatarsal stress fracture. 2. Mild diffuse atrophy and fatty infiltration of the intrinsic foot musculature with mild edema-like intramuscular signal. Findings are nonspecific but can be seen in the setting of neuropathy or disuse. 3. Mild degenerative changes of the first MTP joint.   Electronically Signed   By: Davina Poke D.O.   On: 04/24/2019 10:21   Assessment and Plan: 29 y.o. female with right foot pain.  Patient has done a great deal of work on her own with home exercises to improve intrinsic foot musculature. However she is still having discomfort at the fifth metatarsal.  One of the concerns on MRI was for neuropathy.  It is possible that she has abnormal sensation in this area/hyperesthesia.  MRI did  not show significant abnormality at this region and as probable that she can tolerate pressure on the lateral aspect of her foot because of abnormal sensation/nerve issue. She may benefit from Lyrica.  This would replace gabapentin.  Discussed this with patient and he would like to run it by her psychiatrist first which is reasonable.  Happy to  prescribe Lyrica in the near future if needed.  Additionally her foot is broad bursa because she has had collapse of her transverse arch.  I think she will benefit from metatarsal pads which would somewhat narrow of the forefoot and potentially make foot wear little more comfortable for her.  Provided metatarsal pads to patient and fitted in her shoe I showed her how to fit in other shoes going forward.  Discussed plan with patient.  Recheck back with me as needed or in 1 month.     Discussed warning signs or symptoms. Please see discharge instructions. Patient expresses understanding.   The above documentation has been reviewed and is accurate and complete Clementeen Graham  Total encounter time 30 minutes including charting time date of service. Discussed possible next steps and metatarsal pads

## 2019-05-08 NOTE — Patient Instructions (Signed)
Thank you for coming in today. Ask your psychiatrist about switching to lyrica from Gabapentin I think it will help your foot.  Continue PT.  Use metatarsal pads from [email or web address]  Keep me updated.

## 2019-08-17 ENCOUNTER — Ambulatory Visit: Payer: 59 | Admitting: Physician Assistant

## 2019-08-17 ENCOUNTER — Encounter: Payer: Self-pay | Admitting: Physician Assistant

## 2019-08-17 ENCOUNTER — Other Ambulatory Visit: Payer: Self-pay

## 2019-08-17 VITALS — BP 138/76 | HR 81 | Temp 99.2°F | Ht 65.0 in | Wt 116.2 lb

## 2019-08-17 DIAGNOSIS — Z23 Encounter for immunization: Secondary | ICD-10-CM

## 2019-08-17 DIAGNOSIS — N760 Acute vaginitis: Secondary | ICD-10-CM

## 2019-08-17 LAB — POCT URINALYSIS DIPSTICK
Bilirubin, UA: NEGATIVE
Blood, UA: NEGATIVE
Glucose, UA: NEGATIVE
Ketones, UA: NEGATIVE
Leukocytes, UA: NEGATIVE
Nitrite, UA: NEGATIVE
Protein, UA: NEGATIVE
Spec Grav, UA: >=1.03 — AB (ref 1.010–1.025)
Urobilinogen, UA: 0.2 E.U./dL
pH, UA: 6 (ref 5.0–8.0)

## 2019-08-17 NOTE — Patient Instructions (Signed)
It was great to see you!  I will be in touch via MyChart with your swab and urine culture results. We will initiate treatment at that time based on your results.   Due to recent changes in healthcare laws, you may see the results of your imaging and laboratory studies on MyChart before your provider has had a chance to review them.  We understand that in some cases there may be results that are confusing or concerning to you. Not all laboratory results come back in the same time frame and the provider may be waiting for multiple results in order to interpret others.  Please give [place] 48 hours in order for your provider to thoroughly review all the results before contacting the office for clarification of your results.   Take care,  Jarold Motto PA-C

## 2019-08-17 NOTE — Progress Notes (Signed)
Brittany [name] is a 29 y.o. female here for a new problem.  I acted as a Neurosurgeon for [employer], PA-C Brittany [name], Brittany [name]  History of Present Illness:   Chief Complaint  Patient presents with  . Vaginitis    HPI  Vaginal itching Pt c/o of itching of her vaginal area. Pt is doing therapy for her vaginal wall. Pt uses a tool for her prolapse. Pt believes her tool may have caused an infection. Pt noticed a smelly white cottage discharge. Symptoms started last week.  Denies: dysuria, hematuria, rectal bleeding, nausea, vomiting, fever, chills, concerns for pregnancy  Past Medical History:  Diagnosis Date  . Ankle injury   . Eating disorder   . Frequent headaches      Social History   Tobacco Use  . Smoking status: Never Smoker  . Smokeless tobacco: Never Used  Vaping Use  . Vaping Use: Never used  Substance Use Topics  . Alcohol use: No    Alcohol/week: 0.0 standard drinks  . Drug use: No    Past Surgical History:  Procedure Laterality Date  . ANKLE SURGERY  2018  . WISDOM TOOTH EXTRACTION      Family History  Problem Relation Age of Onset  . Depression Mother   . Depression Brother   . Diabetes Maternal Grandmother   . Hearing loss Maternal Grandmother   . Alcohol abuse Maternal Grandfather   . Depression Maternal Grandfather   . Diabetes Maternal Grandfather     No Known Allergies  Current Medications:   Current Outpatient Medications:  .  amphetamine-dextroamphetamine (ADDERALL XR) 5 MG 24 hr capsule, Take by mouth., Disp: , Rfl:  .  clonazePAM (KLONOPIN) 0.5 MG tablet, Take 0.5 mg by mouth daily as needed., Disp: , Rfl:  .  DULoxetine (CYMBALTA) 60 MG capsule, Take 90 mg by mouth daily. , Disp: , Rfl:  .  gabapentin (NEURONTIN) 100 MG capsule, Take 100 mg by mouth 2 (two) times daily., Disp: , Rfl:  .  polyethylene glycol powder (GLYCOLAX/MIRALAX) 17 GM/SCOOP powder, Take by mouth., Disp: , Rfl:    Review of Systems:   ROS  Negative unless  otherwise specified per HPI.  Vitals:   Vitals:   08/17/19 1025  BP: 138/76  Pulse: 81  Temp: 99.2 F (37.3 C)  TempSrc: Temporal  SpO2: 98%  Weight: 116 lb 3.2 oz (52.7 kg)  Height: 5\' 5"  (1.651 m)     Body mass index is 19.34 kg/m.  Physical Exam:   Physical Exam Vitals and nursing note reviewed.  Constitutional:      General: She is not in acute distress.    Appearance: She is well-developed. She is not ill-appearing or toxic-appearing.  Cardiovascular:     Rate and Rhythm: Normal rate and regular rhythm.     Pulses: Normal pulses.     Heart sounds: Normal heart sounds, S1 normal and S2 normal.     Comments: No LE edema Pulmonary:     Effort: Pulmonary effort is normal.     Breath sounds: Normal breath sounds.  Genitourinary:    Comments: Deferred Skin:    General: Skin is warm and dry.  Neurological:     Mental Status: She is alert.     GCS: GCS eye subscore is 4. GCS verbal subscore is 5. GCS motor subscore is 6.  Psychiatric:        Speech: Speech normal.        Behavior: Behavior normal. Behavior is  cooperative.     Results for orders placed or performed in visit on 08/17/19  POCT Urinalysis Dipstick  Result Value Ref Range   Color, UA Yellow    Clarity, UA clear    Glucose, UA Negative Negative   Bilirubin, UA Negative    Ketones, UA Negative    Spec Grav, UA >=1.030 (A) 1.010 - 1.025   Blood, UA Negative    pH, UA 6.0 5.0 - 8.0   Protein, UA Negative Negative   Urobilinogen, UA 0.2 0.2 or 1.0 E.U./dL   Nitrite, UA Negative    Leukocytes, UA Negative Negative   Appearance     Odor      Assessment and Plan:   Brittany [name] was seen today for vaginitis.  Diagnoses and all orders for this visit:  Acute vaginitis -     POCT Urinalysis Dipstick -     Urine Culture; Future -     Cervicovaginal ancillary only( Brittany [name])   UA is negative. Will await culture results. Will await vaginal swabs to treat. Worsening precautions  advised.  . Reviewed expectations re: course of current medical issues. . Discussed self-management of symptoms. . Outlined signs and symptoms indicating need for more acute intervention. . Patient verbalized understanding and all questions were answered. . See orders for this visit as documented in the electronic medical record. . Patient received an After-Visit Summary.  CMA or LPN served as scribe during this visit. History, Physical, and Plan performed by medical provider. The above documentation has been reviewed and is accurate and complete.  Jarold Motto, PA-C

## 2019-08-17 NOTE — Addendum Note (Signed)
Addended byGracy Racer on: 08/17/2019 12:58 PM   Modules accepted: Orders

## 2019-08-18 LAB — URINE CULTURE
MICRO NUMBER:: 10824636
SPECIMEN QUALITY:: ADEQUATE

## 2019-08-20 ENCOUNTER — Other Ambulatory Visit: Payer: Self-pay | Admitting: Physician Assistant

## 2019-08-20 LAB — CERVICOVAGINAL ANCILLARY ONLY
Bacterial Vaginitis (gardnerella): POSITIVE — AB
Candida Glabrata: NEGATIVE
Candida Vaginitis: POSITIVE — AB
Comment: NEGATIVE
Comment: NEGATIVE
Comment: NEGATIVE

## 2019-08-20 MED ORDER — METRONIDAZOLE 500 MG PO TABS: 500.0000 mg | ORAL_TABLET | Freq: Two times a day (BID) | ORAL | 0 refills | Status: AC

## 2019-08-20 MED ORDER — FLUCONAZOLE 150 MG PO TABS: 150.0000 mg | ORAL_TABLET | Freq: Once | ORAL | 0 refills | Status: AC

## 2019-08-28 ENCOUNTER — Encounter: Payer: Self-pay | Admitting: Physician Assistant

## 2019-08-29 ENCOUNTER — Other Ambulatory Visit: Payer: Self-pay | Admitting: Physician Assistant

## 2019-08-29 DIAGNOSIS — N760 Acute vaginitis: Secondary | ICD-10-CM

## 2019-08-30 ENCOUNTER — Encounter: Payer: Self-pay | Admitting: Family Medicine

## 2019-08-30 ENCOUNTER — Encounter: Payer: Self-pay | Admitting: Physician Assistant

## 2019-08-30 NOTE — Telephone Encounter (Signed)
Please advise 

## 2019-08-31 ENCOUNTER — Other Ambulatory Visit (HOSPITAL_COMMUNITY)
Admission: RE | Admit: 2019-08-31 | Discharge: 2019-08-31 | Disposition: A | Discharge status: Home / Self Care | Payer: 59 | Source: Ambulatory Visit | Attending: Physician Assistant | Admitting: Physician Assistant

## 2019-08-31 ENCOUNTER — Other Ambulatory Visit: Payer: 59

## 2019-08-31 ENCOUNTER — Other Ambulatory Visit: Payer: Self-pay

## 2019-08-31 DIAGNOSIS — N760 Acute vaginitis: Secondary | ICD-10-CM | POA: Diagnosis present

## 2019-09-03 ENCOUNTER — Ambulatory Visit: Payer: 59 | Admitting: Physical Therapy

## 2019-09-03 ENCOUNTER — Encounter: Payer: Self-pay | Admitting: Physical Therapy

## 2019-09-03 ENCOUNTER — Encounter: Payer: Self-pay | Admitting: Physician Assistant

## 2019-09-03 ENCOUNTER — Telehealth: Payer: Self-pay | Admitting: Physician Assistant

## 2019-09-03 ENCOUNTER — Ambulatory Visit: Payer: 59 | Admitting: Physician Assistant

## 2019-09-03 ENCOUNTER — Other Ambulatory Visit: Payer: Self-pay

## 2019-09-03 ENCOUNTER — Other Ambulatory Visit: Payer: Self-pay | Admitting: Physician Assistant

## 2019-09-03 VITALS — BP 110/68 | HR 89 | Temp 97.7°F | Ht 65.0 in | Wt 115.4 lb

## 2019-09-03 DIAGNOSIS — R42 Dizziness and giddiness: Secondary | ICD-10-CM

## 2019-09-03 DIAGNOSIS — N942 Vaginismus: Secondary | ICD-10-CM

## 2019-09-03 DIAGNOSIS — N760 Acute vaginitis: Secondary | ICD-10-CM | POA: Diagnosis not present

## 2019-09-03 DIAGNOSIS — R2681 Unsteadiness on feet: Secondary | ICD-10-CM | POA: Diagnosis not present

## 2019-09-03 LAB — CERVICOVAGINAL ANCILLARY ONLY
Bacterial Vaginitis (gardnerella): NEGATIVE
Candida Glabrata: NEGATIVE
Candida Vaginitis: NEGATIVE
Comment: NEGATIVE
Comment: NEGATIVE
Comment: NEGATIVE

## 2019-09-03 NOTE — Therapy (Signed)
[facility] [address] [place], [place], [address] Phone: [phone]   Fax:  [phone]  Physical Therapy Evaluation  Patient Details  Name: Brittany [name] MRN: [id number] Date of Birth: Jun 09, 1990 Referring Provider (PT): Rinaldo Cloud, [place]   Encounter Date: 09/03/2019   PT End of Session - 09/03/19 1247    Visit Number 1    PT Start Time 1145    PT Stop Time 1228    PT Time Calculation (min) 43 min    Activity Tolerance Patient tolerated treatment well    Behavior During Therapy [facility] for tasks assessed/performed           Past Medical History:  Diagnosis Date  . Ankle injury   . Eating disorder   . Frequent headaches     Past Surgical History:  Procedure Laterality Date  . ANKLE SURGERY  2018  . WISDOM TOOTH EXTRACTION      There were no vitals filed for this visit.    Subjective Assessment - 09/03/19 1146    Subjective Pt is a 29 y/o female who presents to OPPT for vertigo.  Pt reports chronic pelvic floor issues that she feels has affected the other parts of her body.  Pt feels that medications for yeast infection and bacterial vaginitis with side effects of vertigo.  Pt reports dizziness began 08/23/19 which she c/o lightheadedness over vertigo.  Symptoms have progressively gotten worse especially with driving, and feels symptoms are still persistent even following cessation of medication.    Patient Stated Goals improve dizziness    Currently in Pain? No/denies              [facility] PT Assessment - 09/03/19 1151      Assessment   Medical Diagnosis vertigo    Referring Provider (PT) Rinaldo Cloud, PA-C    Onset Date/Surgical Date 08/23/19    Hand Dominance Right    Next MD Visit PRN    Prior Therapy has had pelvic floor therapy      Precautions   Precautions None      Restrictions   Weight Bearing Restrictions No      Balance Screen   Has the patient fallen in the past 6 months No    Has the patient had a  decrease in activity level because of a fear of falling?  Yes    Is the patient reluctant to leave their home because of a fear of falling?  No      Home Environment   Living Environment Private residence    Living Arrangements Spouse/significant other    Type of Home House    Home Access Level entry    Home Layout One level    Home Equipment None      Prior Function   Level of Independence Independent    Vocation Part time employment    Vocation Requirements 2 part time jobs: works at [employer] - cares for snakes, Set designer at [employer], camping, running, swimming, singing, writing; will [employer] when able      Cognition   Overall Cognitive Status Within Functional Limits for tasks assessed      Posture/Postural Control   Posture/Postural Control Postural limitations    Postural Limitations Rounded Shoulders;Forward head      Ambulation/Gait   Gait Comments staggering gait with suspect trunk ataxia - no LOB  Vestibular Assessment - 09/03/19 1155      Vestibular Assessment   General Observation symptoms currently at rest 5/10      Symptom Behavior   Subjective history of current problem see subjective    Type of Dizziness  Lightheadedness;Imbalance   "spaced out", swaying   Frequency of Dizziness constant    Duration of Dizziness exacerbation of symtpoms lasting up to hours    Symptom Nature Constant;Spontaneous    Aggravating Factors Spontaneous onset    Relieving Factors Rest;Slow movements    Progression of Symptoms Worse    History of similar episodes new onset of symptoms      Oculomotor Exam   Oculomotor Alignment Normal    Smooth Pursuits Intact   with increase in symptoms   Saccades Intact      Vestibulo-Ocular Reflex   VOR 1 Head Only (x 1 viewing) intact - delayed increased postural sway with horizontal and vertical - increase in symptoms with both directions    VOR Cancellation Normal    increased sway following     Positional Testing   Sidelying Test Sidelying Right;Sidelying Left    Horizontal Canal Testing Horizontal Canal Right;Horizontal Canal Left      Sidelying Right   Sidelying Right Duration constant    Sidelying Right Symptoms No nystagmus      Sidelying Left   Sidelying Left Duration constant    Sidelying Left Symptoms No nystagmus      Horizontal Canal Right   Horizontal Canal Right Duration constant    Horizontal Canal Right Symptoms Normal      Horizontal Canal Left   Horizontal Canal Left Duration constant    Horizontal Canal Left Symptoms Normal      Corporate treasurer Comment able to maintain balance on compliant surface with EC - inccreased hip sway after 3-5 sec without any LOB      Positional Sensitivities   Sit to Supine Lightheadedness    Supine to Left Side Lightheadedness    Supine to Right Side Lightheadedness    Supine to Sitting Lightheadedness    Head Turning x 5 Mild dizziness    Head Nodding x 5 Mild dizziness    Rolling Right Mild dizziness    Rolling Left Mild dizziness              Objective measurements completed on examination: See above findings.               PT Education - 09/03/19 1247    Education Details HEP    Person(s) Educated Patient    Methods Explanation;Demonstration;Handout    Comprehension Verbalized understanding;Returned demonstration               PT Long Term Goals - 09/03/19 1256      PT LONG TERM GOAL #1   Title to be determined if pt returns    Status New                  Plan - 09/03/19 1251    Clinical Impression Statement Pt is a 29 y/o female who presents to OPPT for vertigo x 11 days with insidious onset.  She initially thought symptoms to be side effect of medication, but symptoms persisted even after medication stopped.  All testing provoked symptoms today and unable to determine definitive cause of vertigo.  Feel symptoms may be  multifactorial, and discussed possible vestibular migraines or hypofunction.  Issued HEP to trial and if no improvement in symptoms  recommend she follow back up with her PCP.  She plans to check in with me in 2 weeks to see how she is doing and may progress exercises if improving.    Personal Factors and Comorbidities Comorbidity 3+;Age;Past/Current Experience    Comorbidities headaches, hx eating disorder, anxiety, depression    Examination-Activity Limitations Locomotion Level;Stand    Examination-Participation Restrictions Occupation;Community Activity    Stability/Clinical Decision Making Evolving/Moderate complexity    Clinical Decision Making Moderate    Rehab Potential Fair    PT Frequency --   PRN   PT Duration 6 weeks    PT Treatment/Interventions ADLs/Self Care Home Management;Canalith Repostioning;Cryotherapy;Electrical Stimulation;Moist Heat;Gait training;Functional mobility training;Therapeutic activities;Therapeutic exercise;Balance training;Neuromuscular re-education;Patient/family education;Dry needling;Manual techniques    PT Next Visit Plan see how HEP is going, progress v/s refer back to PCP    PT Home Exercise Plan Access Code: [email or web address]    Consulted and Agree with Plan of Care Patient           Patient will benefit from skilled therapeutic intervention in order to improve the following deficits and impairments:  Abnormal gait, Dizziness, Decreased balance  Visit Diagnosis: Dizziness and giddiness - Plan: PT plan of care cert/re-cert  Unsteadiness on feet - Plan: PT plan of care cert/re-cert     Problem List Patient Active Problem List   Diagnosis Date Noted  . Anorexia nervosa, restricting type, in full remission, moderate 02/16/2018  . History of depression 02/16/2018  . GAD (generalized anxiety disorder) 12/16/2017  . Pelvic floor dysfunction 12/16/2017  . Right hip pain 11/02/2017  . Grade 2 ankle sprain, right, initial encounter 05/16/2016  . Benign  hypermobility syndrome 07/24/2015  . 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 Right ankle pain 03/03/2015      Clarita Crane, PT, DPT 09/03/19 12:57 PM     [facility] [address] [place], [place], [address] Phone: [phone]   Fax:  [phone]  Name: Delania Ferg MRN: [id number] Date of Birth: Oct 09, 1990

## 2019-09-03 NOTE — Patient Instructions (Addendum)
It was great to see you!  You will be contacted about your referral to the gynecologist.  For your vestibular rehab with ste[facility]ie --> please go there now [address] [facility] 2nd floor   Take care,  Jarold Motto PA-C

## 2019-09-03 NOTE — Progress Notes (Signed)
Brittany [name] is a 29 y.o. female here for a new problem.  I acted as a Neurosurgeon for [employer], PA-C Corky Mull, LPN   History of Present Illness:   Chief Complaint  Patient presents with  . Dizziness    HPI   Dizziness Pt has been having dizziness/lightheadedness symptoms x 1 week. Pt is very unbalanced and having nausea off and on. Her dad and aunt have had vertigo. She feels unsteady on her feet. She has not fallen and is able to maintain her balance. She has stopped driving because she does not feel safe doing so. Denies: slurred speech, unusual headaches.  At first she thought that this was related to recently taking flagyl/diflucan, but she thinks that this is possibly two separate issues.  Her husband, Brittany [name], was briefly in the room and denies that patient has had any other significant neurological symptoms that he is aware of.  Vaginitis/Vaginismus Completed her flagyl and diflucan course as prescribed. She is still having discharge, odor and lower pelvic pain since she last saw me. Has stopped her pelvic floor physical therapy. She has significant anxiety around her vaginal exercises and rehabilitation. Has not seen ob-gyn and is unable to have penetrative sex. This is greatly impacting her life.   Past Medical History:  Diagnosis Date  . Ankle injury   . Eating disorder   . Frequent headaches      Social History   Tobacco Use  . Smoking status: Never Smoker  . Smokeless tobacco: Never Used  Vaping Use  . Vaping Use: Never used  Substance Use Topics  . Alcohol use: No    Alcohol/week: 0.0 standard drinks  . Drug use: No    Past Surgical History:  Procedure Laterality Date  . ANKLE SURGERY  2018  . WISDOM TOOTH EXTRACTION      Family History  Problem Relation Age of Onset  . Depression Mother   . Depression Brother   . Diabetes Maternal Grandmother   . Hearing loss Maternal Grandmother   . Alcohol abuse Maternal Grandfather   .  Depression Maternal Grandfather   . Diabetes Maternal Grandfather     No Known Allergies  Current Medications:   Current Outpatient Medications:  .  clonazePAM (KLONOPIN) 0.5 MG tablet, Take 0.5 mg by mouth daily as needed., Disp: , Rfl:  .  DULoxetine (CYMBALTA) 60 MG capsule, Take 90 mg by mouth daily. , Disp: , Rfl:  .  gabapentin (NEURONTIN) 300 MG capsule, Take 300 mg by mouth 3 (three) times daily., Disp: , Rfl:  .  polyethylene glycol powder (GLYCOLAX/MIRALAX) 17 GM/SCOOP powder, Take by mouth., Disp: , Rfl:  .  VYVANSE 20 MG capsule, Take 20 mg by mouth every morning., Disp: , Rfl:    Review of Systems:   ROS  Negative unless otherwise specified per HPI.  Vitals:   Vitals:   09/03/19 1016  BP: 110/68  Pulse: 89  Temp: 97.7 F (36.5 C)  TempSrc: Temporal  SpO2: 98%  Weight: 115 lb 6.1 oz (52.3 kg)  Height: 5\' 5"  (1.651 m)     Body mass index is 19.2 kg/m.  Physical Exam:   Physical Exam Vitals and nursing note reviewed.  Constitutional:      General: She is not in acute distress.    Appearance: She is well-developed. She is not ill-appearing or toxic-appearing.  Cardiovascular:     Rate and Rhythm: Normal rate and regular rhythm.     Pulses: Normal pulses.  Heart sounds: Normal heart sounds, S1 normal and S2 normal.     Comments: No LE edema Pulmonary:     Effort: Pulmonary effort is normal.     Breath sounds: Normal breath sounds.  Skin:    General: Skin is warm and dry.  Neurological:     Mental Status: She is alert.     GCS: GCS eye subscore is 4. GCS verbal subscore is 5. GCS motor subscore is 6.     Cranial Nerves: Cranial nerves are intact.     Sensory: Sensation is intact.     Motor: Tremor (unusual head tremor at times when asked to focus eyes) present. No pronator drift.     Coordination: Coordination abnormal.     Gait: Gait abnormal.  Psychiatric:        Speech: Speech normal.        Behavior: Behavior normal. Behavior is  cooperative.     Assessment and Plan:   [name] was seen today for dizziness.  Diagnoses and all orders for this visit:  Dizziness Urgent referral to vestibular rehab -- we were able to secure an appointment for her to go to at the end of today's visit. If negative/inconclusive, will obtain stat head CT and urgent neuro referral with low threshold to go to the ER if any symptoms worsen. -     Ambulatory referral to Physical Therapy  Acute vaginitis; Vaginismus Swab from Friday (completed after course of antibiotics) is negative. Referral to gynecology for further evaluation and management. -     Ambulatory referral to Gynecology  . Reviewed expectations re: course of current medical issues. . Discussed self-management of symptoms. . Outlined signs and symptoms indicating need for more acute intervention. . Patient verbalized understanding and all questions were answered. . See orders for this visit as documented in the electronic medical record. . Patient received an After-Visit Summary.  CMA or LPN served as scribe during this visit. History, Physical, and Plan performed by medical provider. The above documentation has been reviewed and is accurate and complete.  Jarold Motto, PA-C

## 2019-09-03 NOTE — Telephone Encounter (Signed)
Patient is returning Donna's call.  

## 2019-09-03 NOTE — Telephone Encounter (Signed)
Spoke to Brittany told her Brittany [name] said I spoke with Brittany [name] about patient, as she was just seen in her office. There is no clear explanation of her symptoms, so at this time I would like to order a CT scan of her head as well as order urgent referral to neurology to work-up what is causing her symptoms. Brittany verbalized understanding and was agreeable with plan. Told her someone will be contacting her to schedule CT and Neurology appt. Brittany verbalized understanding.

## 2019-09-03 NOTE — Telephone Encounter (Signed)
Left message on voicemail to call office.  

## 2019-09-03 NOTE — Telephone Encounter (Signed)
Referral and CT placed.

## 2019-09-03 NOTE — Patient Instructions (Signed)
Access Code: [email or web address] URL: [email or web address] Date: 09/03/2019 Prepared by: Moshe Cipro  Exercises Seated Gaze Stabilization with Head Rotation - 2 x daily - 7 x weekly - 1 sets - 30 seconds Seated Gaze Stabilization with Head Nod - 2 x daily - 7 x weekly - 1 sets - 30 seconds Seated Horizontal Saccades - 2 x daily - 7 x weekly - 3 sets - 10 reps Seated Vertical Saccades - 2 x daily - 7 x weekly - 3 sets - 10 reps

## 2019-09-03 NOTE — Telephone Encounter (Signed)
Please call patient.  I spoke with PT Moshe Cipro about patient, as she was just seen in her office.  There is no clear explanation of her symptoms, so at this time I would like to order a CT scan of her head as well as order urgent referral to neurology to work-up what is causing her symptoms.  Please let me know ASAP if patient is agreeable.  Jarold Motto

## 2019-09-03 NOTE — Telephone Encounter (Signed)
Brittany [name] pt is agreeable to CT scan and Neurologist.

## 2019-09-04 ENCOUNTER — Telehealth: Payer: Self-pay | Admitting: *Deleted

## 2019-09-04 ENCOUNTER — Ambulatory Visit (HOSPITAL_BASED_OUTPATIENT_CLINIC_OR_DEPARTMENT_OTHER)
Admission: RE | Admit: 2019-09-04 | Discharge: 2019-09-04 | Disposition: A | Discharge status: Home / Self Care | Payer: 59 | Source: Ambulatory Visit | Attending: Physician Assistant | Admitting: Physician Assistant

## 2019-09-04 DIAGNOSIS — R42 Dizziness and giddiness: Secondary | ICD-10-CM | POA: Diagnosis present

## 2019-09-04 NOTE — Telephone Encounter (Signed)
Denny Peon from [facility] calling with STAT CT results she said it was Negative/Normal. Told her okay thank you.

## 2019-09-04 NOTE — Telephone Encounter (Signed)
Told Brittany [name] CT was Negative/Normal and sent message.

## 2019-09-06 ENCOUNTER — Other Ambulatory Visit: Payer: Self-pay

## 2019-09-06 ENCOUNTER — Encounter: Payer: Self-pay | Admitting: Neurology

## 2019-09-06 ENCOUNTER — Ambulatory Visit: Payer: 59 | Admitting: Neurology

## 2019-09-06 ENCOUNTER — Telehealth: Payer: Self-pay | Admitting: Neurology

## 2019-09-06 VITALS — Ht 61.0 in | Wt 112.0 lb

## 2019-09-06 DIAGNOSIS — M6289 Other specified disorders of muscle: Secondary | ICD-10-CM

## 2019-09-06 DIAGNOSIS — K5909 Other constipation: Secondary | ICD-10-CM | POA: Diagnosis not present

## 2019-09-06 DIAGNOSIS — H81399 Other peripheral vertigo, unspecified ear: Secondary | ICD-10-CM | POA: Diagnosis not present

## 2019-09-06 DIAGNOSIS — R109 Unspecified abdominal pain: Secondary | ICD-10-CM | POA: Diagnosis not present

## 2019-09-06 DIAGNOSIS — R42 Dizziness and giddiness: Secondary | ICD-10-CM | POA: Diagnosis not present

## 2019-09-06 NOTE — Telephone Encounter (Signed)
spoke to the patient she states she will call me back to schedule  [employer] Auth: [employer] via [employer]

## 2019-09-06 NOTE — Telephone Encounter (Signed)
Patient called me back she is scheduled at [facility] for 09/11/19.

## 2019-09-06 NOTE — Progress Notes (Addendum)
mSubjective:    Patient ID: Brittany [name] is a 29 y.o. female.  HPI     Brittany Foley, MD, PhD [facility] [address] [address] [place], [place] [address]  Dear Brittany [name],   I saw your patient, Brittany [name], upon your kind request, in my Neurologic Clinic today for initial consultation of her dizziness. The patient is accompanied by her father today. As you know, Brittany [name] is a 29 year old right-handed woman with an underlying medical history of headaches, history of ankle injury, and prior history of eating disorder, who reports intermittent dizziness for the past several weeks, symptoms started after she started Flagyl. She finished the Flagyl approximately 9 days ago.  Symptoms include lightheadedness, swaying sensation, no significant headaches but she does have a history of recurrent headaches.  She also reports additional symptoms that have been going on for several days or weeks.  She has had abdominal muscle spasms.  These tend to come in clusters.  She has no warning for these.  She has had some loss of appetite and nausea but the symptoms are improving the past 2 to 3 days.  She has a longstanding history of pelvic floor issues.  She currently does not see a GYN but has been in treatment with a physical therapist for this.  She has had some visual symptoms including floaters and after images but no visual field defect, no one-sided visual loss, no double vision, no consistent blurry vision.  She reports no history of migraines.  She denies one-sided throbbing headaches, she has had neck pain and has a history of scoliosis since childhood. She reports recent increase in her anxiety.  She was started on gabapentin by her psychiatrist and was taking 300 mg 3 times daily.  Then she was advised to change it to 600 mg twice daily but since her dizziness may have coincided with her gabapentin she was told to taper off of it.  She was given another  prescription of the 100 mg strength and took 200 mg nightly yesterday, will be taking 100 mg nightly today and will stop it altogether.  She does report feeling off balance and sometimes she cannot walk a straight line.  She feels that her balance has improved a little bit.  She denies any hearing problems.  She has not had an eye examination in probably 8 years. She does not drink caffeine on a daily basis.  She has had constipation and diarrhea.  Sometimes she has severe constipation and takes magnesium or a laxative.  She has not seen GI for any of these issues, at least not in the recent past.  Per father, she had work-up for celiac disease in the past.    I reviewed your office records. She was recently seen by outpatient physical therapy and was given some home exercises. She had a head CT without contrast on 09/04/2019 and I reviewed the results: IMPRESSION: Unremarkable non-contrast CT appearance of the brain. No evidence of acute intracranial abnormality.   Initially she thought her symptoms stemmed from taking medication but they persisted even after she stopped the medication.  09/06/2019: Addendum: I reviewed recent blood test results from 08/14/2019: Thyroid panel with TSH was unremarkable.  Iron and total iron binding capacity was unremarkable, B12 was 457, vitamin D 52.  Her Past Medical History Is Significant For: Past Medical History:  Diagnosis Date  . Ankle injury   . Eating disorder   . Frequent headaches  Her Past Surgical History Is Significant For: Past Surgical History:  Procedure Laterality Date  . ANKLE SURGERY  2018  . WISDOM TOOTH EXTRACTION      Her Family History Is Significant For: Family History  Problem Relation Age of Onset  . Depression Mother   . Depression Brother   . Diabetes Maternal Grandmother   . Hearing loss Maternal Grandmother   . Alcohol abuse Maternal Grandfather   . Depression Maternal Grandfather   . Diabetes Maternal Grandfather      Her Social History Is Significant For: Social History   Socioeconomic History  . Marital status: Married    Spouse name: Not on file  . Number of children: Not on file  . Years of education: Not on file  . Highest education level: Not on file  Occupational History  . Not on file  Tobacco Use  . Smoking status: Never Smoker  . Smokeless tobacco: Never Used  Vaping Use  . Vaping Use: Never used  Substance and Sexual Activity  . Alcohol use: No    Alcohol/week: 0.0 standard drinks  . Drug use: No  . Sexual activity: Not on file  Other Topics Concern  . Not on file  Social History Narrative  . Not on file   Social Determinants of Health   Financial Resource Strain:   . Difficulty of Paying Living Expenses: Not on file  Food Insecurity:   . Worried About Programme researcher, broadcasting/film/videounning Out of Food in the Last Year: Not on file  . Ran Out of Food in the Last Year: Not on file  Transportation Needs:   . Lack of Transportation (Medical): Not on file  . Lack of Transportation (Non-Medical): Not on file  Physical Activity:   . Days of Exercise per Week: Not on file  . Minutes of Exercise per Session: Not on file  Stress:   . Feeling of Stress : Not on file  Social Connections:   . Frequency of Communication with Friends and Family: Not on file  . Frequency of Social Gatherings with Friends and Family: Not on file  . Attends Religious Services: Not on file  . Active Member of Clubs or Organizations: Not on file  . Attends BankerClub or Organization Meetings: Not on file  . Marital Status: Not on file    Her Allergies Are:  No Known Allergies:   Her Current Medications Are:  Outpatient Encounter Medications as of 09/06/2019  Medication Sig  . clonazePAM (KLONOPIN) 0.5 MG tablet Take 0.5 mg by mouth daily as needed.  . DULoxetine (CYMBALTA) 60 MG capsule Take 90 mg by mouth daily.   Marland Kitchen. gabapentin (NEURONTIN) 300 MG capsule Take 300 mg by mouth 4 (four) times daily.   . polyethylene glycol powder  (GLYCOLAX/MIRALAX) 17 GM/SCOOP powder Take by mouth.  Marland Kitchen. VYVANSE 20 MG capsule Take 20 mg by mouth every morning.   No facility-administered encounter medications on file as of 09/06/2019.  :   Review of Systems:  Out of a complete 14 point review of systems, all are reviewed and negative with the exception of these symptoms as listed below:  Review of Systems  Neurological:       Here for consult on worsening dizziness spells. Pt reports over the last few months severe vertigo and dizziness. Pt has trouble standing/ walking for long periods and will drop to her knees and random times. Pt is off balance and also reports random spasms throughout her body.     Objective:  Neurological Exam  Physical Exam Physical Examination:   There were no vitals filed for this visit.  General Examination: The patient is a very pleasant 29 y.o. female in no acute distress. She appears well-developed and well-nourished and well groomed.   HEENT: Normocephalic, atraumatic, pupils are equal, round and reactive to light and accommodation. Funduscopic exam is normal with sharp disc margins noted. Extraocular tracking is good without limitation to gaze excursion or nystagmus noted. Normal smooth pursuit is noted. Hearing is grossly intact. Tympanic membranes are clear bilaterally. Face is symmetric with normal facial animation and normal facial sensation. Speech is clear with no dysarthria noted. There is no hypophonia. There is no lip, neck/head, jaw or voice tremor. Neck is supple with full range of passive and active motion. There are no carotid bruits on auscultation. Oropharynx exam reveals: mild mouth dryness, adequate dental hygiene.  No orofacial dyskinesias.   Chest: Clear to auscultation without wheezing, rhonchi or crackles noted.  Heart: S1+S2+0, regular and normal without murmurs, rubs or gallops noted.   Abdomen: Soft, non-tender and non-distended with normal bowel sounds appreciated on  auscultation.  She has intermittent abdominal muscle spasms, no choreiform movements, no athetoid movements, no tremors.  These came in clusters and then subsided.  She has intermittent forward flexion of her entire body starting in the abdominal area.   Extremities: There is no pitting edema in the distal lower extremities bilaterally. Pedal pulses are intact.  Skin: Warm and dry without trophic changes noted.  Musculoskeletal: exam reveals no obvious joint deformities, tenderness or joint swelling or erythema.   Neurologically:  Mental status: The patient is awake, alert and oriented in all 4 spheres. Her immediate and remote memory, attention, language skills and fund of knowledge are appropriate. There is no evidence of aphasia, agnosia, apraxia or anomia. Speech is clear with normal prosody and enunciation. Thought process is linear. Mood is normal and affect is normal.  Cranial nerves II - XII are as described above under HEENT exam. In addition: shoulder shrug is normal with equal shoulder height noted. Motor exam: Thin muscle bulk, good strength.  Normal tone is noted. There is no drift, tremor or rebound.  No fasciculations, no focal atrophy, Reflexes are 3+ throughout. Babinski: Toes are flexor bilaterally. Fine motor skills and coordination: Intact grossly in the upper and lower extremities, no evidence of athetoid movements, no dystonic posturing.  Cerebellar testing: No dysmetria or intention tremor on finger to nose testing. Heel to shin is unremarkable bilaterally. There is no truncal or gait ataxia.  Sensory exam: intact to light touch in the upper and lower extremities.  Gait, station and balance: She stands easily. No veering to one side is noted. No leaning to one side is noted. Posture is age-appropriate and stance is narrow based. Gait shows normal stride length and normal pace. No problems turning are noted.  Assessment and Plan:  In summary, Eliz Nigg is a very  pleasant 29 y.o.-year old female with an underlying medical history of headaches, history of ankle injury, and prior history of eating disorder, who presents for evaluation of her dizziness of several weeks duration, she has had other symptoms as well including muscle spasms, difficulty with her balance.  Neurologic exam is largely nonfocal with the exception of a cluster of abdominal spasms which led her to have flexion suddenly in her upper body and abdominal area.  She has no focal neurological finding, in particular, no orthostatic hypotension, blood pressure and pulse were fairly  stable, pulse with appropriate increase, no significant drop in her blood pressure, no cerebellar dysfunction.  She has no evidence of dystonia, tremor, or asymmetry on exam.  Her history is not supportive of vertiginous migraines.  She does not have classic vertigo symptoms either.  She was evaluated by physical therapy for this.  I am not sure how to explain her abdominal spasms, generally speaking I do not see a primary neurological cause of her symptoms of dizziness, balance problems or muscle spasms.  She is largely reassured in that regard.  Nevertheless, I did have some suggestions for her today including evaluation with a GI specialist for abdominal spasms, history of nausea, history of diarrhea, history of intermittent severe constipation.  In addition, for her pelvic floor dysfunction, she is supposed to see a GYN, a referral is underway as I understand.  She has had ongoing physical therapy for this.  She is in the process of completely tapering off the gabapentin.  She is advised that gabapentin can cause balance issues, sedation, and dizziness.  I would favor tapering the gabapentin as well.  She is reminded to stay well-hydrated.  She is encouraged to seek a formal evaluation through an eye doctor, any optometrist or ophthalmologist of her choosing.  She has not had an eye exam in about 8 years.  From my end of things,  for diagnostic clarification and to rule out a structural cause of her symptoms I would like to proceed with a brain MRI with and without contrast.  She did have a recent noncontrasted head CT which was unremarkable which is reassuring as well.  We will call her to schedule her MRI and also with the results, in addition, we will do just a little bit of blood work today to look for electrolyte disturbance and we will check HTLV antibodies as well as CK levels.  We will keep her posted as to her results by phone call and follow-up in this clinic if needed.  I answered all their questions today and the patient and her father were in agreement. Thank you very much for allowing me to participate in the care of this nice patient. If I can be of any further assistance to you please do not hesitate to call me at [phone].  Sincerely,   Brittany Foley, MD, PhD

## 2019-09-06 NOTE — Patient Instructions (Signed)
I think you have had several different problems coming together.  There may not be one unifying cause of your symptoms.  Your dizziness and balance issues may tie in with taking gabapentin, I would favor that you continue to taper off of it completely and stay off of it for now.  Please also follow-up with your psychiatrist regarding alternative treatment options instead of gabapentin.   I do not believe your history is suggestive of vertiginous migraines.  Your symptoms are not classic for vertigo and you do not really have migraine headaches.  You may have had intermittent tension headaches.  Your neurological exam is nonfocal essentially, you do have abdominal muscle spasms.  I am not convinced that there is a primary neurological problem and different symptoms may may tie in with different underlying problems.  Your abdominal spasms may be related to chronic GI problems, it may be worth your while to see a GI specialist, since you also have had diarrhea and severe constipation.  Please talk to Jarold Motto about a referral to GI.  Please also consider further work-up for your pelvic floor issues which have been ongoing for quite some time, even years as I understand.  I would favor a referral to a GYN.  It sounds like [name] has offered a referral or has been working on referring you to a GYN.  Please try to hydrate well and try to rest well.  Ultimately, if you do have ongoing issues with swaying or spinning sensation, you may benefit from seeing an ear nose throat specialist physician.  Since you have seen some floaters and have not had an eye examination in years, please seek a formal eye examination through an optometrist or ophthalmologist of your choosing.  From my end of things I recommend a brain MRI with and without contrast to rule out a structural cause of your balance issues, this is more for my reassurance.  In addition, we will do some blood work today including liver function,  kidney function, electrolytes, muscle enzymes.  I would also like to add antibodies that can be seen in patients who have a condition that can cause muscle spasm and weakness particularly in the legs. We will call you with your test results to keep you posted and plan to follow you in this clinic if needed.

## 2019-09-07 LAB — HTLV I+II ANTIBODIES, (EIA), BLD: HTLV I/II Ab: NEGATIVE

## 2019-09-07 LAB — COMPREHENSIVE METABOLIC PANEL
ALT: 14 IU/L (ref 0–32)
AST: 14 IU/L (ref 0–40)
Albumin/Globulin Ratio: 1.8 (ref 1.2–2.2)
Albumin: 4.5 g/dL (ref 3.9–5.0)
Alkaline Phosphatase: 59 IU/L (ref 48–121)
BUN/Creatinine Ratio: 13 (ref 9–23)
BUN: 9 mg/dL (ref 6–20)
Bilirubin Total: 0.3 mg/dL (ref 0.0–1.2)
CO2: 24 mmol/L (ref 20–29)
Calcium: 9.3 mg/dL (ref 8.7–10.2)
Chloride: 102 mmol/L (ref 96–106)
Creatinine, Ser: 0.71 mg/dL (ref 0.57–1.00)
GFR calc Af Amer: 134 mL/min/{1.73_m2} (ref >59)
GFR calc non Af Amer: 116 mL/min/{1.73_m2} (ref >59)
Globulin, Total: 2.5 g/dL (ref 1.5–4.5)
Glucose: 82 mg/dL (ref 65–99)
Potassium: 4.1 mmol/L (ref 3.5–5.2)
Sodium: 136 mmol/L (ref 134–144)
Total Protein: 7 g/dL (ref 6.0–8.5)

## 2019-09-07 LAB — CK: Total CK: 77 U/L (ref 32–182)

## 2019-09-11 ENCOUNTER — Telehealth: Payer: Self-pay | Admitting: Neurology

## 2019-09-11 ENCOUNTER — Encounter: Payer: Self-pay | Admitting: Neurology

## 2019-09-11 ENCOUNTER — Ambulatory Visit: Payer: 59

## 2019-09-11 ENCOUNTER — Encounter: Payer: Self-pay | Admitting: Physician Assistant

## 2019-09-11 ENCOUNTER — Telehealth: Payer: Self-pay

## 2019-09-11 ENCOUNTER — Other Ambulatory Visit: Payer: Self-pay

## 2019-09-11 DIAGNOSIS — M6289 Other specified disorders of muscle: Secondary | ICD-10-CM

## 2019-09-11 DIAGNOSIS — R42 Dizziness and giddiness: Secondary | ICD-10-CM

## 2019-09-11 DIAGNOSIS — R259 Unspecified abnormal involuntary movements: Secondary | ICD-10-CM

## 2019-09-11 DIAGNOSIS — R109 Unspecified abdominal pain: Secondary | ICD-10-CM

## 2019-09-11 NOTE — Telephone Encounter (Signed)
Please advise patient that I reviewed her blood work she gave [place] and we also did some additional blood work.  We had to cancel today's MRI because of involuntary twitching -I spoke with Caryn Bee, MRI tech - and it was not completely safe to proceed.  Involuntary twitching would have affected the images as well.  While we can certainly consider doing the brain MRI under anesthesia at the hospital, I am not sure I want to go that route.  I would favor a second opinion from a neurologist at a specialty clinic for movement disorders just to make sure they do not recommend any other testing from the neurological standpoint. We can request evaluation at [facility] or [facility] or [facility].  That would allow [place] to seek an opinion from a specialist to see if they have any other diagnostic recommendations.  From my end of things, I am not sure what to suggest next, I am not sure that I would like to go directly to a brain MRI under general anesthesia.    I would still recommend that she seek evaluation through GI and GYN independently, as discussed during our visit.

## 2019-09-11 NOTE — Telephone Encounter (Signed)
I called the pt and we discussed lab results. She verbalized understanding.

## 2019-09-11 NOTE — Telephone Encounter (Signed)
-----   Message from [name], MD sent at 09/11/2019  6:59 AM EDT ----- Labs were good, pls update pt.

## 2019-09-11 NOTE — Telephone Encounter (Signed)
I called the pt and we reviewed Dr. Teofilo Pod recommendation. Pt is agreeable to the second opinion and would like for referral to be sent to [facility].

## 2019-09-11 NOTE — Telephone Encounter (Signed)
Referral placed. Brittany [name], please send to [facility].

## 2019-09-11 NOTE — Addendum Note (Signed)
Addended by: [name] on: 09/11/2019 03:55 PM   Modules accepted: Orders

## 2019-09-11 NOTE — Progress Notes (Signed)
Labs were good, pls update pt.

## 2019-09-12 ENCOUNTER — Encounter: Payer: Self-pay | Admitting: Physician Assistant

## 2019-09-12 ENCOUNTER — Other Ambulatory Visit: Payer: 59

## 2019-09-12 NOTE — Telephone Encounter (Signed)
Called [facility] to get patient scheduled and I had to fax notes for Dr. Review to see who be best fit for patient. Telephone [phone]- fax [phone] . I have also called talked to the patient she is aware of the process and she has telephone numbers .

## 2019-09-13 NOTE — Telephone Encounter (Signed)
Spoke to pt, explained to her how to go into her My Chart and access the letter. Pt verbalized understanding.

## 2019-09-17 ENCOUNTER — Other Ambulatory Visit: Payer: Self-pay | Admitting: Physician Assistant

## 2019-09-17 ENCOUNTER — Encounter: Payer: Self-pay | Admitting: Physician Assistant

## 2019-09-17 DIAGNOSIS — R109 Unspecified abdominal pain: Secondary | ICD-10-CM

## 2019-09-19 ENCOUNTER — Ambulatory Visit (INDEPENDENT_AMBULATORY_CARE_PROVIDER_SITE_OTHER): Payer: 59 | Admitting: Family Medicine

## 2019-09-19 ENCOUNTER — Other Ambulatory Visit: Payer: Self-pay

## 2019-09-19 ENCOUNTER — Encounter: Payer: Self-pay | Admitting: Family Medicine

## 2019-09-19 VITALS — BP 112/78 | HR 91 | Temp 98.1°F | Ht 61.0 in | Wt 114.4 lb

## 2019-09-19 DIAGNOSIS — M62838 Other muscle spasm: Secondary | ICD-10-CM

## 2019-09-19 DIAGNOSIS — R109 Unspecified abdominal pain: Secondary | ICD-10-CM

## 2019-09-19 DIAGNOSIS — M6289 Other specified disorders of muscle: Secondary | ICD-10-CM | POA: Diagnosis not present

## 2019-09-19 DIAGNOSIS — K623 Rectal prolapse: Secondary | ICD-10-CM | POA: Diagnosis not present

## 2019-09-19 MED ORDER — DICYCLOMINE HCL 10 MG PO CAPS: 10.0000 mg | ORAL_CAPSULE | Freq: Three times a day (TID) | ORAL | 0 refills | Status: DC

## 2019-09-19 NOTE — Progress Notes (Signed)
Patient: Brittany [name] MRN: [id number] DOB: 03/03/1990 PCP: Orland Mustard, MD     Subjective:  Chief Complaint  Patient presents with  . Spasms  . Referral    GI; OBGYN    HPI: The patient is a 29 y.o. female who presents today for spasms. She states all of her symptoms started after she was treated with flagyl and diflucan. After she took these she started to have dizziness. On 8/30 she started to have involuntary muscle spasms. Head CT was negative. They lowered her gabapentin and had her stop this. Dizziness resolved. Her spasms have continued. She saw neurology who could not explain her spasms. She states the spasms are in her abdominal area and are so violent that they move her whole body. Mri was attempted but could not be done due to spasms.  Referral placed for sedated MRI to be done, but has not been scheduled yet. She wonders if the magnesium she takes could be contributing. She can not drive, run. She nearly falls if spasms bad. Very life altering.   -she started magnesium to help with stool. As she can't get her stool around her prolapse. Wasn't sure if the magnesium was contributing to her spasms, so stopped a week ago. No change in spasms. No blood in stool. No correlation with food.   -she also thinks she has rectal prolapse and pelvic floor dysfunction. Needs referral to gyn for this.   Review of Systems  Constitutional: Negative for fever.  Respiratory: Negative for shortness of breath and wheezing.   Cardiovascular: Negative for chest pain and palpitations.  Gastrointestinal: Positive for abdominal pain and diarrhea. Negative for nausea.  Neurological: Positive for light-headedness. Negative for dizziness.    Allergies Patient has No Known Allergies.  Past Medical History Patient  has a past medical history of Ankle injury, Eating disorder, and Frequent headaches.  Surgical History Patient  has a past surgical history that includes Wisdom tooth extraction  and Ankle surgery (2018).  Family History Pateint's family history includes Alcohol abuse in her maternal grandfather; Depression in her brother, maternal grandfather, and mother; Diabetes in her maternal grandfather and maternal grandmother; Hearing loss in her maternal grandmother.  Social History Patient  reports that she has never smoked. She has never used smokeless tobacco. She reports that she does not drink alcohol and does not use drugs.    Objective: Vitals:   09/19/19 1312  BP: 112/78  Pulse: 91  Temp: 98.1 F (36.7 C)  TempSrc: Temporal  SpO2: 98%  Weight: 114 lb 6.4 oz (51.9 kg)  Height: 5\' 1"  (1.549 m)    Body mass index is 21.62 kg/m.  Physical Exam Vitals reviewed.  Constitutional:      Appearance: She is normal weight.     Comments: Involuntary moves with hard jerks/spasms.   HENT:     Head: Normocephalic and atraumatic.  Cardiovascular:     Rate and Rhythm: Normal rate and regular rhythm.     Heart sounds: Normal heart sounds.  Pulmonary:     Effort: Pulmonary effort is normal.     Breath sounds: Normal breath sounds.  Abdominal:     General: Abdomen is flat. Bowel sounds are normal.     Palpations: Abdomen is soft.     Tenderness: There is no abdominal tenderness.  Musculoskeletal:     Cervical back: Normal range of motion and neck supple. No rigidity.  Lymphadenopathy:     Cervical: No cervical adenopathy.  Neurological:  General: No focal deficit present.     Mental Status: She is alert and oriented to person, place, and time.     Cranial Nerves: No cranial nerve deficit.     Sensory: No sensory deficit.     Motor: No weakness.     Coordination: Coordination normal.     Gait: Gait abnormal.     Comments: Involuntary spasms, quite significant Gait abnormal due to the above and nearly falls  Border line hyperreflexia of lower DTR in bilateral knees  Psychiatric:        Mood and Affect: Mood normal.        Behavior: Behavior normal.     Labs reviewed from neuro.     Assessment/plan: 1. Muscle spasms  Spasms are in abdomen, but so violent her entire body jerks as witnessed today. Waiting on brain MRI, needs sedation. Referral done by neurology for this since needs sedation.  -checking more labs. CK normal at neuro -ANA -trial of bentyl. Thought about skeletal muscle relaxer, but appears to be more in her "intestines" so will do smooth muscle trial. Want her to email me in week and if not better will do regular muscle relaxer.  I do feel like it's more skeletal as her body jerks (back), but she is convinced it's her stomach.  -seeing GI, number given. Referral already placed.  -? Vaccine side effect, but typically see spasms right after mrna and she had this back in march.   - CBC with Differential/Platelet; Future - TSH; Future - ANA, IFA Comprehensive Panel; Future - COMPLETE METABOLIC PANEL WITH GFR; Future - Magnesium; Future  2. Rectal prolapse  - Ambulatory referral to Gynecology  3. Pelvic floor dysfunction  - Ambulatory referral to Physical Therapy    This visit occurred during the SARS-CoV-2 public health emergency.  Safety protocols were in place, including screening questions prior to the visit, additional usage of staff PPE, and extensive cleaning of exam room while observing appropriate contact time as indicated for disinfecting solutions.     Return if symptoms worsen or fail to improve.   Orland Mustard, MD [facility]   09/19/2019

## 2019-09-19 NOTE — Patient Instructions (Addendum)
[facility]  [phone]) [phone]  -referral to gyn placed.   -more labs (auto immune, etc)    -trial smooth muscle relaxer called bentyl. email me in a week if this doesn't work as your jerks are so violent I think there is more skeletal muscle component to it as well.   im sorry you are going through this....   Hang in there,  Dr. Artis Flock

## 2019-09-20 ENCOUNTER — Encounter: Payer: Self-pay | Admitting: Family Medicine

## 2019-09-21 LAB — COMPLETE METABOLIC PANEL WITH GFR
AG Ratio: 1.8 (calc) (ref 1.0–2.5)
ALT: 10 U/L (ref 6–29)
AST: 13 U/L (ref 10–30)
Albumin: 4.4 g/dL (ref 3.6–5.1)
Alkaline phosphatase (APISO): 48 U/L (ref 31–125)
BUN: 13 mg/dL (ref 7–25)
CO2: 26 mmol/L (ref 20–32)
Calcium: 9.4 mg/dL (ref 8.6–10.2)
Chloride: 106 mmol/L (ref 98–110)
Creat: 0.7 mg/dL (ref 0.50–1.10)
GFR, Est African American: 137 mL/min/{1.73_m2} (ref >=60)
GFR, Est Non African American: 118 mL/min/{1.73_m2} (ref >=60)
Globulin: 2.4 g/dL (calc) (ref 1.9–3.7)
Glucose, Bld: 85 mg/dL (ref 65–99)
Potassium: 4.1 mmol/L (ref 3.5–5.3)
Sodium: 138 mmol/L (ref 135–146)
Total Bilirubin: 0.5 mg/dL (ref 0.2–1.2)
Total Protein: 6.8 g/dL (ref 6.1–8.1)

## 2019-09-21 LAB — TSH: TSH: 2.34 mIU/L

## 2019-09-21 LAB — CBC WITH DIFFERENTIAL/PLATELET
Absolute Monocytes: 461 cells/uL (ref 200–950)
Basophils Absolute: 69 cells/uL (ref 0–200)
Basophils Relative: 1.3 %
Eosinophils Absolute: 472 cells/uL (ref 15–500)
Eosinophils Relative: 8.9 %
HCT: 39.7 % (ref 35.0–45.0)
Hemoglobin: 13.4 g/dL (ref 11.7–15.5)
Lymphs Abs: 1892 cells/uL (ref 850–3900)
MCH: 31.2 pg (ref 27.0–33.0)
MCHC: 33.8 g/dL (ref 32.0–36.0)
MCV: 92.5 fL (ref 80.0–100.0)
MPV: 12 fL (ref 7.5–12.5)
Monocytes Relative: 8.7 %
Neutro Abs: 2406 cells/uL (ref 1500–7800)
Neutrophils Relative %: 45.4 %
Platelets: 227 10*3/uL (ref 140–400)
RBC: 4.29 10*6/uL (ref 3.80–5.10)
RDW: 11.6 % (ref 11.0–15.0)
Total Lymphocyte: 35.7 %
WBC: 5.3 10*3/uL (ref 3.8–10.8)

## 2019-09-21 LAB — ANA, IFA COMPREHENSIVE PANEL
Anti Nuclear Antibody (ANA): POSITIVE — AB
ENA SM Ab Ser-aCnc: <1 AI
SM/RNP: <1 AI
SSA (Ro) (ENA) Antibody, IgG: <1 AI
SSB (La) (ENA) Antibody, IgG: <1 AI
Scleroderma (Scl-70) (ENA) Antibody, IgG: <1 AI
ds DNA Ab: <1 IU/mL

## 2019-09-21 LAB — MAGNESIUM: Magnesium: 2.1 mg/dL (ref 1.5–2.5)

## 2019-09-21 LAB — ANTI-NUCLEAR AB-TITER (ANA TITER): ANA Titer 1: 1:80 {titer} — ABNORMAL HIGH

## 2019-09-24 ENCOUNTER — Other Ambulatory Visit: Payer: Self-pay

## 2019-09-24 ENCOUNTER — Other Ambulatory Visit: Payer: Self-pay | Admitting: Family Medicine

## 2019-09-24 ENCOUNTER — Ambulatory Visit: Payer: 59 | Admitting: Obstetrics and Gynecology

## 2019-09-24 ENCOUNTER — Telehealth: Payer: Self-pay

## 2019-09-24 ENCOUNTER — Encounter: Payer: Self-pay | Admitting: Obstetrics and Gynecology

## 2019-09-24 ENCOUNTER — Encounter: Payer: Self-pay | Admitting: Family Medicine

## 2019-09-24 VITALS — BP 110/62 | HR 71 | Ht 64.5 in | Wt 113.0 lb

## 2019-09-24 DIAGNOSIS — Z01419 Encounter for gynecological examination (general) (routine) without abnormal findings: Secondary | ICD-10-CM | POA: Diagnosis not present

## 2019-09-24 DIAGNOSIS — M62838 Other muscle spasm: Secondary | ICD-10-CM

## 2019-09-24 DIAGNOSIS — M6289 Other specified disorders of muscle: Secondary | ICD-10-CM | POA: Diagnosis not present

## 2019-09-24 DIAGNOSIS — Z124 Encounter for screening for malignant neoplasm of cervix: Secondary | ICD-10-CM

## 2019-09-24 DIAGNOSIS — R198 Other specified symptoms and signs involving the digestive system and abdomen: Secondary | ICD-10-CM

## 2019-09-24 DIAGNOSIS — N6320 Unspecified lump in the left breast, unspecified quadrant: Secondary | ICD-10-CM

## 2019-09-24 DIAGNOSIS — N941 Unspecified dyspareunia: Secondary | ICD-10-CM | POA: Diagnosis not present

## 2019-09-24 DIAGNOSIS — F411 Generalized anxiety disorder: Secondary | ICD-10-CM

## 2019-09-24 DIAGNOSIS — N816 Rectocele: Secondary | ICD-10-CM

## 2019-09-24 DIAGNOSIS — N942 Vaginismus: Secondary | ICD-10-CM

## 2019-09-24 DIAGNOSIS — R768 Other specified abnormal immunological findings in serum: Secondary | ICD-10-CM

## 2019-09-24 NOTE — Telephone Encounter (Signed)
-----   Message from Romualdo Bolk, MD sent at 09/24/2019  4:12 PM EDT ----- Please set up left breast ultrasound, lump at 2 o'clock on the left. Thanks, Noreene Larsson

## 2019-09-24 NOTE — Telephone Encounter (Signed)
Patient placed in [facility] hold.  

## 2019-09-24 NOTE — Patient Instructions (Addendum)
EXERCISE AND DIET:  We recommended that you start or continue a regular exercise program for good health. Regular exercise means any activity that makes your heart beat faster and makes you sweat.  We recommend exercising at least 30 minutes per day at least 3 days a week, preferably 4 or 5.  We also recommend a diet low in fat and sugar.  Inactivity, poor dietary choices and obesity can cause diabetes, heart attack, stroke, and kidney damage, among others.    ALCOHOL AND SMOKING:  Women should limit their alcohol intake to no more than 7 drinks/beers/glasses of wine (combined, not each!) per week. Moderation of alcohol intake to this level decreases your risk of breast cancer and liver damage. And of course, no recreational drugs are part of a healthy lifestyle.  And absolutely no smoking or even second hand smoke. Most people know smoking can cause heart and lung diseases, but did you know it also contributes to weakening of your bones? Aging of your skin?  Yellowing of your teeth and nails?  CALCIUM AND VITAMIN D:  Adequate intake of calcium and Vitamin D are recommended.  The recommendations for exact amounts of these supplements seem to change often, but generally speaking 1,000 mg of calcium (between diet and supplement) and 800 units of Vitamin D per day seems prudent. Certain women may benefit from higher intake of Vitamin D.  If you are among these women, your doctor will have told you during your visit.    PAP SMEARS:  Pap smears, to check for cervical cancer or precancers,  have traditionally been done yearly, although recent scientific advances have shown that most women can have pap smears less often.  However, every woman still should have a physical exam from her gynecologist every year. It will include a breast check, inspection of the vulva and vagina to check for abnormal growths or skin changes, a visual exam of the cervix, and then an exam to evaluate the size and shape of the uterus and  ovaries.  And after 29 years of age, a rectal exam is indicated to check for rectal cancers. We will also provide age appropriate advice regarding health maintenance, like when you should have certain vaccines, screening for sexually transmitted diseases, bone density testing, colonoscopy, mammograms, etc.   MAMMOGRAMS:  All women over 40 years old should have a yearly mammogram. Many facilities now offer a "3D" mammogram, which may cost around $50 extra out of pocket. If possible,  we recommend you accept the option to have the 3D mammogram performed.  It both reduces the number of women who will be called back for extra views which then turn out to be normal, and it is better than the routine mammogram at detecting truly abnormal areas.    COLON CANCER SCREENING: Now recommend starting at age 45. At this time colonoscopy is not covered for routine screening until 50. There are take home tests that can be done between 45-49.   COLONOSCOPY:  Colonoscopy to screen for colon cancer is recommended for all women at age 50.  We know, you hate the idea of the prep.  We agree, BUT, having colon cancer and not knowing it is worse!!  Colon cancer so often starts as a polyp that can be seen and removed at colonscopy, which can quite literally save your life!  And if your first colonoscopy is normal and you have no family history of colon cancer, most women don't have to have it again for   10 years.  Once every ten years, you can do something that may end up saving your life, right?  We will be happy to help you get it scheduled when you are ready.  Be sure to check your insurance coverage so you understand how much it will cost.  It may be covered as a preventative service at no cost, but you should check your particular policy.      Breast Self-Awareness Breast self-awareness means being familiar with how your breasts look and feel. It involves checking your breasts regularly and reporting any changes to your  health care provider. Practicing breast self-awareness is important. A change in your breasts can be a sign of a serious medical problem. Being familiar with how your breasts look and feel allows you to find any problems early, when treatment is more likely to be successful. All women should practice breast self-awareness, including women who have had breast implants. How to do a breast self-exam One way to learn what is normal for your breasts and whether your breasts are changing is to do a breast self-exam. To do a breast self-exam: Look for Changes  1. Remove all the clothing above your waist. 2. Stand in front of a mirror in a room with good lighting. 3. Put your hands on your hips. 4. Push your hands firmly downward. 5. Compare your breasts in the mirror. Look for differences between them (asymmetry), such as: ? Differences in shape. ? Differences in size. ? Puckers, dips, and bumps in one breast and not the other. 6. Look at each breast for changes in your skin, such as: ? Redness. ? Scaly areas. 7. Look for changes in your nipples, such as: ? Discharge. ? Bleeding. ? Dimpling. ? Redness. ? A change in position. Feel for Changes Carefully feel your breasts for lumps and changes. It is best to do this while lying on your back on the floor and again while sitting or standing in the shower or tub with soapy water on your skin. Feel each breast in the following way:  Place the arm on the side of the breast you are examining above your head.  Feel your breast with the other hand.  Start in the nipple area and make  inch (2 cm) overlapping circles to feel your breast. Use the pads of your three middle fingers to do this. Apply light pressure, then medium pressure, then firm pressure. The light pressure will allow you to feel the tissue closest to the skin. The medium pressure will allow you to feel the tissue that is a little deeper. The firm pressure will allow you to feel the tissue  close to the ribs.  Continue the overlapping circles, moving downward over the breast until you feel your ribs below your breast.  Move one finger-width toward the center of the body. Continue to use the  inch (2 cm) overlapping circles to feel your breast as you move slowly up toward your collarbone.  Continue the up and down exam using all three pressures until you reach your armpit.  Write Down What You Find  Write down what is normal for each breast and any changes that you find. Keep a written record with breast changes or normal findings for each breast. By writing this information down, you do not need to depend only on memory for size, tenderness, or location. Write down where you are in your menstrual cycle, if you are still menstruating. If you are having trouble noticing differences   in your breasts, do not get discouraged. With time you will become more familiar with the variations in your breasts and more comfortable with the exam. How often should I examine my breasts? Examine your breasts every month. If you are breastfeeding, the best time to examine your breasts is after a feeding or after using a breast pump. If you menstruate, the best time to examine your breasts is 5-7 days after your period is over. During your period, your breasts are lumpier, and it may be more difficult to notice changes. When should I see my health care provider? See your health care provider if you notice:  A change in shape or size of your breasts or nipples.  A change in the skin of your breast or nipples, such as a reddened or scaly area.  Unusual discharge from your nipples.  A lump or thick area that was not there before.  Pain in your breasts.  Anything that concerns you. Toileting Techniques for Bowel Movements (Defecation)  Using your belly (abdomen) and pelvic floor muscles to have a bowel movement is usually instinctive.  Sometimes people can have problems with these muscles and have  to relearn proper defecation (emptying) techniques.  If you have weakness in your muscles, organs that are falling out, decreased sensation in your pelvis, or ignore your urge to go, you may find yourself straining to have a bowel movement.  You are straining if you are:  . holding your breath or taking in a huge gulp of air and holding it  . keeping your lips and jaw tensed and closed tightly . turning red in the face because of excessive pushing or forcing . developing or worsening your  hemorrhoids . getting faint while pushing . not emptying completely and have to defecate many times a day  If you are straining, you are actually making it harder for yourself to have a bowel movement.  Many people find they are pulling up with the pelvic floor muscles and closing off instead of opening the anus. Due to lack pelvic floor relaxation and coordination the abdominal muscles, one has to work harder to push the feces out.  Many people have never been taught how to defecate efficiently and effectively.  Notice what happens to your body when you are having a bowel movement.  While you are sitting on the toilet pay attention to the following areas: . Jaw and mouth position . Angle of your hips   . Whether your feet touch the ground or not . Arm placement . Spine position . Waist . Belly tension . Anus (opening of the anal canal)  An Evacuation/Defecation Plan   Here are the 4 basic points:  1. Lean forward enough for your elbows to rest on your knees 2. Support your feet on the floor or use a low stool if your feet don't touch the floor  3. Push out your belly as if you have swallowed a beach ball--you should feel a widening of your waist 4. Open and relax your pelvic floor muscles, rather than tightening around the anus   The following conditions my require modifications to your toileting posture:  . If you have had surgery in the past that limits your back, hip, pelvic, knee or ankle  flexibility . Constipation   Your healthcare practitioner may make the following additional suggestions and adjustments:  1) Sit on the toilet  a) Make sure your feet are supported. b) Notice your hip angle and spine position--most  people find it effective to lean forward or raise their knees, which can help the muscles around the anus to relax  c) When you lean forward, place your forearms on your thighs for support  2) Relax suggestions a) Breath deeply in through your nose and out slowly through your mouth as if you are smelling the flowers and blowing out the candles. b) To become aware of how to relax your muscles, contracting and releasing muscles can be helpful.  Pull your pelvic floor muscles in tightly by using the image of holding back gas, or closing around the anus (visualize making a circle smaller) and lifting the anus up and in.  Then release the muscles and your anus should drop down and feel open. Repeat 5 times ending with the feeling of relaxation. c) Keep your pelvic floor muscles relaxed; let your belly bulge out. d) The digestive tract starts at the mouth and ends at the anal opening, so be sure to relax both ends of the tube.  Place your tongue on the roof of your mouth with your teeth separated.  This helps relax your mouth and will help to relax the anus at the same time.  3) Empty (defecation) a) Keep your pelvic floor and sphincter relaxed, then bulge your anal muscles.  Make the anal opening wide.  b) Stick your belly out as if you have swallowed a beach ball. c) Make your belly wall hard using your belly muscles while continuing to breathe. Doing this makes it easier to open your anus. d) Breath out and give a grunt (or try using other sounds such as ahhhh, shhhhh, ohhhh or grrrrrrr).  4) Finish a) As you finish your bowel movement, pull the pelvic floor muscles up and in.  This will leave your anus in the proper place rather than remaining pushed out and down. If  you leave your anus pushed out and down, it will start to feel as though that is normal and give you incorrect signals about needing to have a bowel movement.   2007, [employer] Doc.23About Rectocele  Overview  A rectocele is a type of hernia which causes different degrees of bulging of the rectal tissues into the vaginal wall.  You may even notice that it presses against the vaginal wall so much that some vaginal tissues droop outside of the opening of your vagina.  Causes of Rectocele  The most common cause is childbirth.  The muscles and ligaments in the pelvis that hold up and support the female organs and vagina become stretched and weakened during labor and delivery.  The more babies you have, the more the support tissues are stretched and weakened.  Not everyone who has a baby will develop a rectocele.  Some women have stronger supporting tissue in the pelvis and may not have as much of a problem as others.  Women who have a Cesarean section usually do not get rectocele's unless they pushed a long time prior to the cesarean delivery.  Other conditions that can cause a rectocele include chronic constipation, a chronic cough, a lot of heavy lifting, and obesity.  Older women may have this problem because the loss of female hormones causes the vaginal tissue to become weaker.  Symptoms  There may not be any symptoms.  If you do have symptoms, they may include:  Pelvic pressure in the rectal area  Protrusion of the lower part of the vagina through the opening of the vagina  Constipation and trapping of  the stool, making it difficult to have a bowel movement.  In severe cases, you may have to press on the lower part of your vagina to help push the stool out of you rectum.  This is called splinting to empty.  Diagnosing Rectocele  Your health care provider will ask about your symptoms and perform a pelvic exam.  S/he will ask you to bear down, pushing like you are having a  bowel movement so as to see how far the lower part of the vagina protrudes into the vagina and possible outside of the vagina.  Your provider will also ask you to contract the muscles of your pelvis (like you are stopping the stream in the middle of urinating) to determine the strength of your pelvic muscles.  Your provider may also do a rectal exam.  Treatment Options  If you do not have any symptoms, no treatment may be necessary.  Other treatment options include:  Pelvic floor exercises: Contracting the muscles in your genital area may help strengthen your muscles and support the organs.  Be sure to get proper exercise instruction from you physical therapist.  A pessary (removealbe pelvic support device) sometimes helps rectocele symptoms.  Surgery: Surgical repair may be necessary. In some cases the uterus may need to be taken out ( a hysterectomy) as well.  There are many types of surgery for pelvic support problems.  Look for physicians who specialize in repair procedures.  You can take care of yourself by:  Treating and preventing constipation  Avoiding heavy lifting, and lifting correctly (with your legs, not with you waist or back)  Treating a chronic cough or bronchitis  Not smoking  avoiding too much weight gain  Doing pelvic floor exercises   2007, [employer] Doc.33

## 2019-09-24 NOTE — Progress Notes (Addendum)
29 y.o. G0P0000 Married White or Caucasian Not Hispanic or Latino female here as a new patient for rectal prolapse and pelvic floor tightness. Per patient has seen therapist for pelvic floor problems in the past. Patient has never seen GYN.   Period Cycle (Days): 28 Period Duration (Days): 5 Period Pattern: Regular Menstrual Flow: Moderate Menstrual Control: Maxi pad, Thin pad Menstrual Control Change Freq (Hours): 4 Dysmenorrhea: (!) Mild Dysmenorrhea Symptoms: Cramping   The patient is having severe spasms in her abdomen, being evaluated by Dr Artis Flock and Neurology. Symptoms started 3 weeks ago.  She is seeing a pelvic floor physical therapist   She is a runner, had to have surgery on her ankle. Felt an issue with her hip caused her to compensate which caused her ankle issue. Her hip, her back and shoulder were all hurting. PT felt it was all from tightness in the pelvic floor.  She has known about pelvic floor dysfunction since 2014.  She started have a gut problem since 2009. She has had trouble emptying her bowels and bladder. 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 She started PT in 2015, helped a little with BM. She has diarrhea/constipation, has severe pain if she has to go to the bathroom, can't empty. Some help with her relaxing her pelvic floor. Anxiety from all of this. For a long time she used miralax. Recently taking magnesium, 250 mg. She needs her stools to be liquid to have a BM. Has bloating.  This summer she was told she had a rectocele. She has pushed on her vagina to empty, helps a little.  She had ultrasound during attempted BM with the PT.   She can't have complete insertion with intercourse. Never been able to wear tampons. Sexually active without penetration.   She saw a NP with lifestyle medicine, did a special stool test.   She tried metamucil years ago, didn't help.   She feels she is able to void  mostly, if she is really bloated then she has trouble voiding .  No baseline vestibular pain.   H/O anxiety, can be bad. Prior h/o depression, better.   No h/o rape.   Patient's last menstrual period was 09/14/2019.          Sexually active: Yes.    The current method of family planning is none.    Exercising: Yes.    Smoker:  no  Health Maintenance: Pap:  never History of abnormal Pap:  no MMG:  n/a BMD:  Unsure -- possibly done at age 16 Colonoscopy: n/a TDaP:  08/17/19 Gardasil: never   reports that she has never smoked. She has never used smokeless tobacco. She reports that she does not drink alcohol and does not use drugs. Works at a [employer].   Past Medical History:  Diagnosis Date  . Ankle injury   . Anxiety   . Eating disorder   . Frequent headaches     Past Surgical History:  Procedure Laterality Date  . ANKLE SURGERY  2018  . HERNIA REPAIR    . WISDOM TOOTH EXTRACTION      Current Outpatient Medications  Medication Sig Dispense Refill  . DULoxetine (CYMBALTA) 60 MG capsule Take 90 mg by mouth daily.     Marland Kitchen MAGNESIUM CITRATE PO Take by mouth.    Marland Kitchen VYVANSE 20 MG capsule Take 20 mg by mouth every morning.    Marland Kitchen  clonazePAM (KLONOPIN) 0.5 MG tablet Take 0.5 mg by mouth daily as needed. (Patient not taking: Reported on 09/19/2019)    . dicyclomine (BENTYL) 10 MG capsule Take 1 capsule (10 mg total) by mouth 4 (four) times daily -  before meals and at bedtime. (Patient not taking: Reported on 09/24/2019) 30 capsule 0  . polyethylene glycol powder (GLYCOLAX/MIRALAX) 17 GM/SCOOP powder Take by mouth. (Patient not taking: Reported on 09/19/2019)     No current facility-administered medications for this visit.    Family History  Problem Relation Age of Onset  . Depression Mother   . Anxiety disorder Mother   . Depression Brother   . Anxiety disorder Brother   . Diabetes Maternal Grandmother   . Hearing loss Maternal Grandmother   . Alcohol abuse  Maternal Grandfather   . Depression Maternal Grandfather   . Diabetes Maternal Grandfather   . Dementia Paternal Grandfather   . Thyroid disease Brother     Review of Systems  Constitutional: Negative.   HENT: Negative.   Eyes: Negative.   Respiratory: Negative.   Cardiovascular: Negative.   Gastrointestinal: Negative.   Endocrine: Negative.   Genitourinary: Negative.   Musculoskeletal: Negative.   Skin: Negative.   Allergic/Immunologic: Negative.   Neurological: Negative.   Hematological: Negative.   Psychiatric/Behavioral: Negative.     Exam:   BP 110/62 (BP Location: Right Arm, Patient Position: Sitting, Cuff Size: Normal)   Pulse 71   Ht 5' 4.5" (1.638 m)   Wt 113 lb (51.3 kg)   LMP 09/14/2019   SpO2 100%   BMI 19.10 kg/m   Weight change: @WEIGHTCHANGE @ Height:   Height: 5' 4.5" (163.8 cm)  Ht Readings from Last 3 Encounters:  09/24/19 5' 4.5" (1.638 m)  09/19/19 5\' 1"  (1.549 m)  09/06/19 5\' 1"  (1.549 m)    General appearance: alert, cooperative and appears stated age Head: Normocephalic, without obvious abnormality, atraumatic Neck: no adenopathy, supple, symmetrical, trachea midline and thyroid normal to inspection and palpation Lungs: clear to auscultation bilaterally Cardiovascular: regular rate and rhythm Breasts: in the left breast at 2 o'clock, a few cm from the areolar region is a 1 cm mobile, smooth lump Abdomen: soft, non-tender; non distended,  no masses,  no organomegaly Extremities: extremities normal, atraumatic, no cyanosis or edema Skin: Skin color, texture, turgor normal. No rashes or lesions Lymph nodes: Cervical, supraclavicular, and axillary nodes normal. No abnormal inguinal nodes palpated Neurologic: Grossly normal   Pelvic: External genitalia:  no lesions              Urethra:  normal appearing urethra with no masses, tenderness or lesions              Bartholins and Skenes: normal                 Vagina: normal appearing vagina with  normal color and discharge, no lesions, grade one rectocele with strong valsalva              Cervix: no lesions               Bimanual Exam:  Uterus:  normal size, contour, position, consistency, mobility, non-tender and anteverted              Adnexa: no mass, fullness, tenderness               Rectovaginal: Confirms               Anus:  normal sphincter tone,  no lesions  Pelvic floor: not tender, doesn't feel tight today.   Zenovia Jordan chaperoned for the exam.  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   09/29/19 Addendum: Records from [facility] were reviewed and sent to be scanned into the chart. All office notes, no labs.

## 2019-09-24 NOTE — Telephone Encounter (Signed)
Call placed to [facility] and spoke with [facility]. Pt scheduled for 10/02/19 at 1240 pm.   Call placed to pt for appt at [facility]. Spoke with pt. Pt agreeable to date and time of appt.  Routing to Dr Oscar La for [facility] Cc: Noreene Larsson, RN for [facility] Hold Encounter closed.

## 2019-09-25 ENCOUNTER — Encounter: Payer: Self-pay | Admitting: Family Medicine

## 2019-09-25 ENCOUNTER — Other Ambulatory Visit: Payer: 59

## 2019-09-25 ENCOUNTER — Other Ambulatory Visit (HOSPITAL_COMMUNITY)
Admission: RE | Admit: 2019-09-25 | Discharge: 2019-09-25 | Disposition: A | Discharge status: Home / Self Care | Payer: 59 | Source: Ambulatory Visit | Attending: Obstetrics and Gynecology | Admitting: Obstetrics and Gynecology

## 2019-09-25 DIAGNOSIS — Z124 Encounter for screening for malignant neoplasm of cervix: Secondary | ICD-10-CM | POA: Insufficient documentation

## 2019-09-25 NOTE — Addendum Note (Signed)
Addended by: Teleshia Lemere E on: 09/25/2019 03:26 PM   Modules accepted: Orders  

## 2019-09-26 LAB — CYTOLOGY - PAP: Diagnosis: NEGATIVE

## 2019-09-27 ENCOUNTER — Encounter: Payer: Self-pay | Admitting: Family Medicine

## 2019-10-02 ENCOUNTER — Ambulatory Visit
Admission: RE | Admit: 2019-10-02 | Discharge: 2019-10-02 | Disposition: A | Discharge status: Home / Self Care | Payer: 59 | Source: Ambulatory Visit | Attending: Obstetrics and Gynecology | Admitting: Obstetrics and Gynecology

## 2019-10-02 ENCOUNTER — Other Ambulatory Visit: Payer: Self-pay | Admitting: Obstetrics and Gynecology

## 2019-10-02 ENCOUNTER — Other Ambulatory Visit: Payer: Self-pay

## 2019-10-02 DIAGNOSIS — N6320 Unspecified lump in the left breast, unspecified quadrant: Secondary | ICD-10-CM

## 2019-10-07 ENCOUNTER — Encounter: Payer: Self-pay | Admitting: Family Medicine

## 2019-10-18 ENCOUNTER — Encounter: Payer: Self-pay | Admitting: Family Medicine

## 2019-10-18 DIAGNOSIS — G259 Extrapyramidal and movement disorder, unspecified: Secondary | ICD-10-CM | POA: Insufficient documentation

## 2019-10-18 NOTE — Progress Notes (Signed)
Office Visit Note  Patient: Brittany [name]             Date of Birth: 1990/06/06           MRN: [id number]             PCP: Orland Mustard, MD Referring: Orland Mustard, MD Visit Date: 10/19/2019   Subjective:  New Patient (Initial Visit) Positive ANA, muscle spasms  History of Present Illness: Brittany [name] is a 29 y.o. female here for evaluation of positive ANA test checked in the setting of muscle spasms and chronic right hip and foot pain.  She has had pain in the hip and foot for years and previously saw sports medicine clinic and physical therapy is done exercises for hypermobility and joint protection but never with a significant benefit.  She has not generally noticed swelling or redness associate with this pain.  She has injured her right ankle multiple times including the surgery most often related to her extensive running.  In the past few months she is having a lot more trouble due to worsening of pelvic floor muscle problems and spasticity which was recently evaluated in movement disorder clinic at [facility].  She is not experiencing any specific numbness or weakness. She was previously a very active runner but has not been able to do so lately on account of her symptoms. She has abnormal gait walking but denies recent falls. She has had a workup at multiple sites including recent neurology evaluation here and with [facility] without any focal lesions identified. She denies generalized pain and allodynia. She denies alopecia, skin rash, pleurisy, lymphadneopathy, raynaud's, or history of blood clots. Rheumatology labs reviewed ANA 1:80 nucleolar pattern   Activities of Daily Living:  Patient reports morning stiffness for 0 minutes.   Patient Reports nocturnal pain.  Difficulty dressing/grooming: Denies Difficulty climbing stairs: Denies Difficulty getting out of chair: Denies Difficulty using hands for taps, buttons, cutlery, and/or  writing: Denies  Review of Systems  Constitutional: Positive for fatigue.  HENT: Negative for mouth sores, mouth dryness and nose dryness.   Eyes: Positive for visual disturbance. Negative for pain, itching and dryness.  Respiratory: Negative for cough, hemoptysis, shortness of breath and difficulty breathing.   Cardiovascular: Negative for chest pain, palpitations and swelling in legs/feet.  Gastrointestinal: Positive for abdominal pain, constipation and diarrhea. Negative for blood in stool.  Endocrine: Positive for increased urination.  Genitourinary: Negative for painful urination.  Musculoskeletal: Positive for myalgias, muscle weakness and myalgias. Negative for arthralgias, joint pain, joint swelling, morning stiffness and muscle tenderness.  Skin: Negative for color change, rash and redness.  Allergic/Immunologic: Negative for susceptible to infections.  Neurological: Positive for dizziness and headaches. Negative for numbness and weakness.  Hematological: Negative for swollen glands.  Psychiatric/Behavioral: Positive for depressed mood and sleep disturbance. The patient is nervous/anxious.     PMFS History:  Patient Active Problem List   Diagnosis Date Noted  . Positive ANA (antinuclear antibody) 10/19/2019  . 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 Anorexia nervosa, restricting type, in full remission, moderate 02/16/2018  . History of depression 02/16/2018  . GAD (generalized anxiety disorder) 12/16/2017  . Pelvic floor dysfunction 12/16/2017  . Right hip pain 11/02/2017  . Grade 2 ankle sprain, right, initial encounter 05/16/2016  . 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 Right ankle pain 03/03/2015  . IBS (irritable bowel syndrome) 05/25/2009    Past Medical  History:  Diagnosis Date  . Ankle injury   . Anxiety   . Eating disorder   . Frequent headaches     Family History  Problem Relation Age of Onset  . Depression  Mother   . Anxiety disorder Mother   . Depression Brother   . Anxiety disorder Brother   . Diabetes Maternal Grandmother   . Hearing loss Maternal Grandmother   . Alcohol abuse Maternal Grandfather   . Depression Maternal Grandfather   . Diabetes Maternal Grandfather   . Dementia Paternal Grandfather   . Thyroid disease Brother    Past Surgical History:  Procedure Laterality Date  . ANKLE SURGERY  2018  . HERNIA REPAIR    . WISDOM TOOTH EXTRACTION     Social History   Social History Narrative  . Not on file   Immunization History  Administered Date(s) Administered  . PFIZER SARS-COV-2 Vaccination 03/29/2019  . Tdap 08/17/2019     Objective: Vital Signs: BP 116/70 (BP Location: Right Arm, Patient Position: Sitting, Cuff Size: Small)   Pulse (!) 102   Ht 5' 4.75" (1.645 m)   Wt 115 lb (52.2 kg)   LMP 10/14/2019   BMI 19.29 kg/m    Physical Exam HENT:     Head: Normocephalic.     Right Ear: External ear normal.     Left Ear: External ear normal.     Mouth/Throat:     Mouth: Mucous membranes are moist.     Pharynx: Oropharynx is clear.  Eyes:     Conjunctiva/sclera: Conjunctivae normal.     Pupils: Pupils are equal, round, and reactive to light.  Cardiovascular:     Rate and Rhythm: Regular rhythm. Tachycardia present.  Pulmonary:     Effort: Pulmonary effort is normal.     Breath sounds: Normal breath sounds.  Skin:    General: Skin is warm and dry.     Findings: No rash.  Neurological:     Mental Status: She is alert.     Comments: Irregular jerking movements mostly truncal flexion and sometimes extension, reduced with distracting stimuli and increased while walking with unusual gait      Musculoskeletal Exam:  Neck full ROM no tenderness swelling or erythema Shoulder, elbow, and hand full ROM no swelling or erythema present No low back paraspinal muscle tenderness Symmetric hip internal and external rotation without pain Knees, ankles, MTPs nontender  and no swelling  CDAI Exam: CDAI Score: -- Patient Global: --; Provider Global: -- Swollen: --; Tender: -- Joint Exam 10/19/2019   No joint exam has been documented for this visit   There is currently no information documented on the homunculus. Go to the Rheumatology activity and complete the homunculus joint exam.  Investigation: No additional findings.  Imaging: US BREAST LTD UNI LEFT INC AXILLA  Result Date: 10/02/2019 CLINICAL DATA:  Palpable abnormality in the LEFT breast. Patient reports feeling this stable mass most of her adult life. She reports no changes to her exam. EXAM: ULTRASOUND OF THE LEFT BREAST COMPARISON:  None. FINDINGS: On physical exam, I palpate a superficial oval mobile mass in the 2 o'clock location of the LEFT breast 4 centimeters from the nipple. Targeted ultrasound is performed, showing a circumscribed oval hypoechoic parallel mass in the 2 o'clock location of the LEFT breast 4 centimeters from nipple measuring 1.3 x 0.7 x 1 point centimeters. Internal blood flow is documented Doppler evaluation. IMPRESSION: Probably benign mass in the LEFT breast with long term stability  per patient's exam. We discussed management options including excision, biopsy, and close follow-up. Imaging followup is recommended at 6, 12, and 24 months to assess stability. The patient concurs with this plan. RECOMMENDATION: Recommend LEFT breast ultrasound in 6 months. I have discussed the findings and recommendations with the patient. If applicable, a reminder letter will be sent to the patient regarding the next appointment. BI-RADS CATEGORY  3: Probably benign. Electronically Signed   By: Norva Pavlov M.D.   On: 10/02/2019 13:33    Recent Labs: Lab Results  Component Value Date   WBC 5.3 09/19/2019   HGB 13.4 09/19/2019   PLT 227 09/19/2019   NA 138 09/19/2019   K 4.1 09/19/2019   CL 106 09/19/2019   CO2 26 09/19/2019   GLUCOSE 85 09/19/2019   BUN 13 09/19/2019   CREATININE  0.70 09/19/2019   BILITOT 0.5 09/19/2019   ALKPHOS 59 09/06/2019   AST 13 09/19/2019   ALT 10 09/19/2019   PROT 6.8 09/19/2019   ALBUMIN 4.5 09/06/2019   CALCIUM 9.4 09/19/2019   GFRAA 137 09/19/2019    Speciality Comments: No specialty comments available.  Procedures:  No procedures performed Allergies: Patient has no known allergies.   Assessment / Plan:     Visit Diagnoses: Positive ANA (antinuclear antibody) - Plan: Anti-scleroderma antibody, RNP Antibody, Anti-Smith antibody, Sjogrens syndrome-A extractable nuclear antibody, Sjogrens syndrome-B extractable nuclear antibody, Anti-DNA antibody, double-stranded, C3 and C4  She does not demonstrate clinical criteria of systemic lupus at this time. We will check ENA panel and serologic criteria at this time. She has no motor weakness or focal neurologic deficit to suggest inflammatory myopathy or neuritis. Her involuntary movements are primarily proximal muscle groups not peripheral also unusual for an inflammatory neuropathy. Overall low suspicion for underlying systemic connective tissue disease so if serology is negative do not recommend any additional rheumatologic workup at this time.  Chronic pain of right ankle  She has pain in the right ankle and foot out of proportion to other areas. Joint is overall normal to physical exam but has remote history of repeated injury to the site. She does not describe color change or temperature change typical for CRPS but could have some focal neurologic pain from this site.  Orders: Orders Placed This Encounter  Procedures  . Anti-scleroderma antibody  . RNP Antibody  . Anti-Smith antibody  . Sjogrens syndrome-A extractable nuclear antibody  . Sjogrens syndrome-B extractable nuclear antibody  . Anti-DNA antibody, double-stranded  . C3 and C4   No orders of the defined types were placed in this encounter.    Follow-Up Instructions: No follow-ups on file.   Fuller Plan,  MD  Note - This record has been created using [employer].  Chart creation errors have been sought, but may not always  have been located. Such creation errors do not reflect on  the standard of medical care.

## 2019-10-19 ENCOUNTER — Other Ambulatory Visit: Payer: Self-pay

## 2019-10-19 ENCOUNTER — Ambulatory Visit: Payer: 59 | Admitting: Internal Medicine

## 2019-10-19 ENCOUNTER — Encounter: Payer: Self-pay | Admitting: Internal Medicine

## 2019-10-19 VITALS — BP 116/70 | HR 102 | Ht 64.75 in | Wt 115.0 lb

## 2019-10-19 DIAGNOSIS — M25571 Pain in right ankle and joints of right foot: Secondary | ICD-10-CM | POA: Diagnosis not present

## 2019-10-19 DIAGNOSIS — G8929 Other chronic pain: Secondary | ICD-10-CM | POA: Diagnosis not present

## 2019-10-19 DIAGNOSIS — R768 Other specified abnormal immunological findings in serum: Secondary | ICD-10-CM | POA: Insufficient documentation

## 2019-10-22 LAB — C3 AND C4
C3 Complement: 86 mg/dL (ref 83–193)
C4 Complement: 16 mg/dL (ref 15–57)

## 2019-10-22 LAB — SJOGRENS SYNDROME-B EXTRACTABLE NUCLEAR ANTIBODY: SSB (La) (ENA) Antibody, IgG: <1 AI

## 2019-10-22 LAB — SJOGRENS SYNDROME-A EXTRACTABLE NUCLEAR ANTIBODY: SSA (Ro) (ENA) Antibody, IgG: <1 AI

## 2019-10-22 LAB — ANTI-DNA ANTIBODY, DOUBLE-STRANDED: ds DNA Ab: <1 IU/mL

## 2019-10-22 LAB — RNP ANTIBODY: Ribonucleic Protein(ENA) Antibody, IgG: <1 AI

## 2019-10-22 LAB — ANTI-SCLERODERMA ANTIBODY: Scleroderma (Scl-70) (ENA) Antibody, IgG: <1 AI

## 2019-10-22 LAB — ANTI-SMITH ANTIBODY: ENA SM Ab Ser-aCnc: <1 AI

## 2019-10-24 ENCOUNTER — Encounter: Payer: Self-pay | Admitting: Obstetrics and Gynecology

## 2019-10-24 ENCOUNTER — Telehealth: Payer: Self-pay

## 2019-10-24 NOTE — Telephone Encounter (Signed)
Routing to Dr Jertson, please advise  

## 2019-10-24 NOTE — Telephone Encounter (Signed)
mychart message sent

## 2019-10-24 NOTE — Telephone Encounter (Signed)
Pt sent following mychart message:  [name], [name] Hello Dr. Oscar La,  I know this is a lot, but since it is so important, I hope you can bear with me:  Did you recieve info from [place] or end up getting to speak with her? If so, do you have any new ideas as to how we can further address both what I originally went to her for (pain in and inability to make my hip, back etc function correctly) since we noticed its apparent correlation with pelvic floor? And as this is related, do you have any ideas as to what else could or should be done to further investigate the recotocile (you said it wasn't a prolapse)? This I imagine might need to be investigated or discussed to discover what can be done to fix it and what role it might be playing in both incomplete emptying and my body's overall malfunctioning as a result. Or do you have any ideas already on what else can be done to move forward? I hope you were at least able to see records from the therapist Dr. Lanae Boast. I haven't been able to get the GI doctor to get any input into all this yet.  On a side note, I don't know if you could see any notes from other doctors, but I have been diagnosed by a neurologist with functional movement disorder (its likely the result of severe anxiety, mostly enhanced by my unsolved and life-limiting health issues and hopefully will be curable). The rheumatologist has cleared me as fine from having autoimmune issues.  If you haven't received information from Dr. Lanae Boast, contact me so I can ensure she gets records to you.  Thank you.

## 2019-10-26 NOTE — Telephone Encounter (Signed)
Left detailed message per Dr Oscar La for pt to have OV to discuss further options. Pt to return call to schedule.

## 2019-11-06 ENCOUNTER — Encounter: Payer: Self-pay | Admitting: Obstetrics and Gynecology

## 2019-11-06 ENCOUNTER — Ambulatory Visit: Payer: 59 | Admitting: Obstetrics and Gynecology

## 2019-11-06 ENCOUNTER — Ambulatory Visit: Payer: 59 | Admitting: Physical Therapy

## 2019-11-06 ENCOUNTER — Other Ambulatory Visit: Payer: Self-pay

## 2019-11-06 VITALS — BP 110/64 | HR 81 | Ht 65.0 in | Wt 113.8 lb

## 2019-11-06 DIAGNOSIS — N942 Vaginismus: Secondary | ICD-10-CM | POA: Diagnosis not present

## 2019-11-06 DIAGNOSIS — M6289 Other specified disorders of muscle: Secondary | ICD-10-CM

## 2019-11-06 DIAGNOSIS — N941 Unspecified dyspareunia: Secondary | ICD-10-CM | POA: Diagnosis not present

## 2019-11-06 DIAGNOSIS — R198 Other specified symptoms and signs involving the digestive system and abdomen: Secondary | ICD-10-CM | POA: Diagnosis not present

## 2019-11-06 NOTE — Progress Notes (Signed)
GYNECOLOGY  VISIT   HPI: 29 y.o.   Married White or Caucasian Not Hispanic or Latino  female   [id number] with Patient's last menstrual period was 10/14/2019.   here for discussion of rectocele and pelvic floor/ hip/back pain. Her gut issues and pain is so severe that it limits her from using her best coping mechanism (ie exercise). Anxiety is limiting. Pelvic floor PT takes a long time secondary to her tension, this creates more anxiety. She takes powdered magnesium to have a BM. Has gone from constipation to diarrhea in the past. Something has gotten worse in the last 1.5 years, maybe a combination of her gut and pelvic floor.  She has seen a team of Doctor's, including: GI, Neurology, Rheumatology, Primary and Psychiatry. She has always had hip issues.  Injured her ankle with running.  In 2019 she saw physical therapist for her hip and ankle.  She saw a different physical therapist.   She is going to f/u with GI for further evaluation, will take a tablet with X-Rays to see her bowel motility.   GYNECOLOGIC HISTORY: Patient's last menstrual period was 10/14/2019. Contraception:none  Menopausal hormone therapy: none         OB History    Gravida  0   Para  0   Term  0   Preterm  0   AB  0   Living  0     SAB  0   TAB  0   Ectopic  0   Multiple  0   Live Births  0              Patient Active Problem List   Diagnosis Date Noted  . Positive ANA (antinuclear antibody) 10/19/2019  . 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 Anorexia nervosa, restricting type, in full remission, moderate 02/16/2018  . History of depression 02/16/2018  . GAD (generalized anxiety disorder) 12/16/2017  . Pelvic floor dysfunction 12/16/2017  . Right hip pain 11/02/2017  . Grade 2 ankle sprain, right, initial encounter 05/16/2016  . 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 Right ankle pain 03/03/2015  . IBS (irritable bowel syndrome)  05/25/2009    Past Medical History:  Diagnosis Date  . Ankle injury   . Anxiety   . Eating disorder   . Frequent headaches     Past Surgical History:  Procedure Laterality Date  . ANKLE SURGERY  2018  . HERNIA REPAIR    . WISDOM TOOTH EXTRACTION      Current Outpatient Medications  Medication Sig Dispense Refill  . ALPRAZolam (XANAX) 0.25 MG tablet Take 0.25 mg by mouth at bedtime as needed for anxiety.    Marland Kitchen escitalopram (LEXAPRO) 5 MG tablet Take 1 tablet by mouth daily.    Marland Kitchen lisdexamfetamine (VYVANSE) 10 MG capsule Take 10 mg by mouth daily.    Marland Kitchen MAGNESIUM CITRATE PO Take by mouth.     No current facility-administered medications for this visit.     ALLERGIES: Patient has no known allergies.  Family History  Problem Relation Age of Onset  . Depression Mother   . Anxiety disorder Mother   . Depression Brother   . Anxiety disorder Brother   . Diabetes Maternal Grandmother   . Hearing loss Maternal Grandmother   . Alcohol abuse Maternal Grandfather   . Depression Maternal Grandfather   . Diabetes Maternal Grandfather   . Dementia Paternal Grandfather   . Thyroid disease Brother  Social History   Socioeconomic History  . Marital status: Married    Spouse name: Not on file  . Number of children: Not on file  . Years of education: Not on file  . Highest education level: Not on file  Occupational History  . Not on file  Tobacco Use  . Smoking status: Never Smoker  . Smokeless tobacco: Never Used  Vaping Use  . Vaping Use: Never used  Substance and Sexual Activity  . Alcohol use: No    Alcohol/week: 0.0 standard drinks  . Drug use: No  . Sexual activity: Yes    Birth control/protection: None  Other Topics Concern  . Not on file  Social History Narrative  . Not on file   Social Determinants of Health   Financial Resource Strain:   . Difficulty of Paying Living Expenses: Not on file  Food Insecurity:   . Worried About Programme researcher, broadcasting/film/video in the  Last Year: Not on file  . Ran Out of Food in the Last Year: Not on file  Transportation Needs:   . Lack of Transportation (Medical): Not on file  . Lack of Transportation (Non-Medical): Not on file  Physical Activity:   . Days of Exercise per Week: Not on file  . Minutes of Exercise per Session: Not on file  Stress:   . Feeling of Stress : Not on file  Social Connections:   . Frequency of Communication with Friends and Family: Not on file  . Frequency of Social Gatherings with Friends and Family: Not on file  . Attends Religious Services: Not on file  . Active Member of Clubs or Organizations: Not on file  . Attends Banker Meetings: Not on file  . Marital Status: Not on file  Intimate Partner Violence:   . Fear of Current or Ex-Partner: Not on file  . Emotionally Abused: Not on file  . Physically Abused: Not on file  . Sexually Abused: Not on file    Review of Systems  Psychiatric/Behavioral: The patient is nervous/anxious.   All other systems reviewed and are negative.   PHYSICAL EXAMINATION:    BP 110/64   Pulse 81   Ht 5\' 5"  (1.651 m)   Wt 113 lb 12.8 oz (51.6 kg)   LMP 10/14/2019   SpO2 98%   BMI 18.94 kg/m     General appearance: alert, cooperative and appears stated age  ASSESSMENT Patient with multiple c/o. She has pain limiting her activity, anxiety, difficulty with defecation, functional movement disorder. I reviewed with her the findings of her pelvic exam from 09/24/19. I reviewed that she was not tender on pelvic exam, her pelvic exam at that visit was not tight or tender. She did have a small grade one rectocele with valsalva. Sexually active without penetration secondary to pain. Suspect vaginismus.    PLAN I reviewed rectocele with her, gave her the ACOG handout on prolapse. I told her that by my prior exam her rectocele was small and I didn't think it was preventing defecation. I told her that chronic straining will likely worsen the  rectocele over time.  I offered to refer her for a second opinion about her rectocele I also reviewed that her pelvic floor was not tender or tight at her last visit. My only recommendations for her pelvic floor pain and difficulty with intercourse are pelvic floor PT and use of her vaginal dilators. We could try vaginal valium if she wanted (off label treatment), not sure  if it would help We talked about muscle relaxants as well, Dr Artis Flock has discussed this with her.    Over 30 minutes was spent in total patient care.

## 2019-11-12 ENCOUNTER — Other Ambulatory Visit: Payer: Self-pay | Admitting: Gastroenterology

## 2019-11-12 ENCOUNTER — Ambulatory Visit
Admission: RE | Admit: 2019-11-12 | Discharge: 2019-11-12 | Disposition: A | Discharge status: Home / Self Care | Payer: 59 | Source: Ambulatory Visit | Attending: Gastroenterology | Admitting: Gastroenterology

## 2019-11-12 DIAGNOSIS — K5900 Constipation, unspecified: Secondary | ICD-10-CM

## 2019-11-13 ENCOUNTER — Ambulatory Visit: Payer: 59 | Attending: Family Medicine | Admitting: Physical Therapy

## 2019-11-13 ENCOUNTER — Encounter: Payer: Self-pay | Admitting: Physical Therapy

## 2019-11-13 ENCOUNTER — Other Ambulatory Visit: Payer: Self-pay

## 2019-11-13 DIAGNOSIS — R278 Other lack of coordination: Secondary | ICD-10-CM | POA: Diagnosis present

## 2019-11-13 DIAGNOSIS — G8929 Other chronic pain: Secondary | ICD-10-CM | POA: Insufficient documentation

## 2019-11-13 DIAGNOSIS — M545 Low back pain, unspecified: Secondary | ICD-10-CM | POA: Insufficient documentation

## 2019-11-13 DIAGNOSIS — R252 Cramp and spasm: Secondary | ICD-10-CM | POA: Insufficient documentation

## 2019-11-13 NOTE — Therapy (Signed)
[facility] [address] [place], [place], [address] Phone: [phone]   Fax:  [phone]  Physical Therapy Evaluation  Patient Details  Name: Brittany [name] MRN: [id number] Date of Birth: 15-May-1990 Referring Provider (PT): Orland Mustard, MD   Encounter Date: 11/13/2019   PT End of Session - 11/13/19 1450    Visit Number 1    Date for PT Re-Evaluation 02/05/20    Authorization Type [employer]    Authorization - Visit Number 1    Authorization - Number of Visits 60    PT Start Time 1240   Pt late   PT Stop Time 1325    PT Time Calculation (min) 45 min    Activity Tolerance Patient tolerated treatment well;Patient limited by pain    Behavior During Therapy [facility] for tasks assessed/performed;Agitated;Anxious           Past Medical History:  Diagnosis Date  . Ankle injury   . Anxiety   . Eating disorder   . Frequent headaches     Past Surgical History:  Procedure Laterality Date  . ANKLE SURGERY  2018  . HERNIA REPAIR    . WISDOM TOOTH EXTRACTION      There were no vitals filed for this visit.    Subjective Assessment - 11/13/19 1238    Subjective Pt referred to OPPT for PF dysfunction.  Pt has been working with Dr. Lanae Boast for back/hip pain.  Symptoms have been present for years and have worsened.  Was a previous Pt here at this facility approx 6 years ago.  Symptoms include PF tightness, difficulty with defecation.  Pt is using pelvic wand both vaginally and rectally for self release.    Pertinent History Anorectal manometry: PF dysenergia, IBS, + ANA, Benign Hypermobility Syndrome, Functional Movement Disorder, Hx of anorexia, depression, Rt ankle injury    Patient Stated Goals improve defecation and time/place to defecation, understand rectocele and how to address, be able to be more intimate with my husband (insertion)    Currently in Pain? Yes    Pain Score 10-Worst pain ever   gets up  to a 10, can be <5 with PT and defecation success   Pain Location Pelvis    Pain Descriptors / Indicators Pressure;Sharp;Aching    Pain Type Chronic pain    Pain Onset More than a month ago    Pain Frequency Constant    Aggravating Factors  sitting on toilet (too much time)    Pain Relieving Factors PT, defecation success, sometimes intercourse can help me relax              [facility] PT Assessment - 11/13/19 0001      Assessment   Medical Diagnosis M62.89 (ICD-10-CM) - Pelvic floor dysfunction    Referring Provider (PT) Orland Mustard, MD    Prior Therapy pelvic PT in past      Precautions   Precautions None      Restrictions   Weight Bearing Restrictions No      Balance Screen   Has the patient fallen in the past 6 months No      Home Environment   Living Environment Private residence    Living Arrangements Spouse/significant other    Type of Home House      Prior Function   Level of Independence Independent    Vocation Unemployed      Cognition   Overall Cognitive Status Within Functional Limits for tasks assessed  Observation/Other Assessments   Observations intermittent proximal involuntary trunk and proximal UE movements (related to Movement Disorder)      Functional Tests   Functional tests Single leg stance;Squat      Squat   Comments WNL      Single Leg Stance   Comments WNL, good balance bil      Posture/Postural Control   Posture/Postural Control Postural limitations    Postural Limitations Rounded Shoulders;Forward head;Increased thoracic kyphosis;Decreased lumbar lordosis      ROM / Strength   AROM / PROM / Strength AROM;PROM;Strength      AROM   AROM Assessment Site Lumbar    Lumbar Flexion palms to ground    Lumbar Extension 10, limited on Rt    Lumbar - Right Side Bend 20    Lumbar - Left Side Bend 20    Lumbar - Right Rotation WFL    Lumbar - Left Rotation WFL      PROM   Overall PROM  Deficits    Overall PROM Comments Rt hip ER  and IR limited      Strength   Overall Strength Comments LEs 5/5 bil      Flexibility   Soft Tissue Assessment /Muscle Length yes   bil adductors limited 30%   Piriformis limited on Rt 25%      Palpation   Spinal mobility limited lumbar PAs and UPAs on Rt    SI assessment  Rt SI joint compressed, limited glide    Palpation comment signif tenderness PF layer 1 and vaginal introitus, prevented internal vaginal exam                      Objective measurements completed on examination: See above findings.     Pelvic Floor Special Questions - 11/13/19 0001    Prior Pelvic/Prostate Exam Yes    Result Pelvic/Prostate Exam  normal    Prior Urinalysis No    Diagnostic Test/Procedure Performed Yes    Type Diagnostic/Procedure Anorectal Manometry    Date Diagnostic/Procedure 2014    Are you Pregnant or attempting pregnancy? No    Prior Pregnancies No    Currently Sexually Active Yes    Is this Painful Yes    Marinoff Scale pain interrupts completion    Urinary Leakage Yes    How often daily    Pad use no    Activities that cause leaking Other    Other activities that cause leaking post-void    Urinary urgency No    Fecal incontinence Yes    Fluid intake water, coffee, tea    Caffeine beverages 1-2 units/day    Falling out feeling (prolapse) Yes    Activities that cause feeling of prolapse when I need to poop but I can't    External Perineal Exam PT explained exam and Pt gave verbal consent    Perineal Body/Introitus  Elevated    External Palpation tender vaginal introitus and layer 1 bil    Prolapse Anterior Wall;Posterior Wall   mild, Gr 1   Strength weak squeeze, no lift   observed externally only, fair/good bulge           [facility] Adult PT Treatment/Exercise - 11/13/19 0001      Self-Care   Self-Care Other Self-Care Comments    Other Self-Care Comments  layer 1 self-massage instruction, breathing to drop PF  PT Short Term Goals  - 11/13/19 1505      PT SHORT TERM GOAL #1   Title Pt will be ind with intial HEP for PF stretching, self-massage and release techniques through positional breathing and visualization.    Time 4    Period Weeks    Status New    Target Date 12/11/19      PT SHORT TERM GOAL #2   Title Pt will achieve Rt hip ROM WNL symmetrical to Lt hip    Time 6    Period Weeks    Status New    Target Date 12/25/19      PT SHORT TERM GOAL #3   Title Pt will demo proper toileting posture, breathwork and bulge ability for improved defecation.    Time 6    Period Weeks    Status New    Target Date 12/25/19             PT Long Term Goals - 11/13/19 1508      PT LONG TERM GOAL #1   Title Pt will be ind with self-care techniques and HEP for optimal PF and trunk mobility and function    Time 12    Period Weeks    Status New    Target Date 02/05/20      PT LONG TERM GOAL #2   Title Pt will report improved ease with defecation at least 3 times a week, taking < 1 hour for evacuation of bowels.    Time 12    Period Weeks    Status New    Target Date 02/05/20      PT LONG TERM GOAL #3   Title Pt will be able to tolerate use of pelvic wand or up to size 4 dilator to improve tolerance of speculum exam or penetrative intercourse.    Time 12    Period Weeks    Status New    Target Date 02/05/20      PT LONG TERM GOAL #4   Title Pt will report improved low back and pelvic pain by at least 50% with ADLs and toileting.    Time 12    Period Weeks    Status New    Target Date 02/05/20      PT LONG TERM GOAL #5   Title Pt will be able to demo improved excursion of PF with contract and bulge for continence and void/defecation success.    Time 12    Period Weeks    Status New    Target Date 02/05/20                  Plan - 11/13/19 1451    Clinical Impression Statement Pt is referred to OPPT with pelvic floor dysfunction which has been present for years.  Pt has been received  cash-based PT x 1.5 years.  She feels that she is using many tricks, tools and knowledge and with as much time as she is committing to HEP, feels she should be more improved.  Symptoms include inability to have penetrative intercourse due to pain, constipation and defecation difficulty, and low back/hip/pelvic pain.  History of anxiety plays a role for her.  She has postural dysfunction, limited lumbar extension mobility, limited Rt hemipelvic mobility with SI joint dysfunction and hip rotation deficits on Rt.  Trigger points are present in Rt gluts and piriformis.  Pt has bil adductor flexibility restrictions.  External pelvic exam revealed elevated perineal body with  limited excursion of movement for PF contraction.  Pt did demo a good diaphragmatic breath and bulge today in supine.  Palpation revealed signif tenderness and tension surrounding vaginal introitus preventing internal exam due to pain.  Pt reported some relief with external massage to layer 1 bil.  PT instructed Pt to start with this and trial introitus massage as tol without pressure to use pelvic wand and/or dilators.  PT observed minor Gr 1 rectocele and cystocele with bulge.  PT discussed how the PF will absorb and reflect Pt's level of stress and anxiety over progress with release and encouraged her to meet herself where she is.  Progress will likely be slow but Pt is a great candidate for PF therapy to address pain, tension and function to improve overall function of PF.    Personal Factors and Comorbidities Comorbidity 1;Comorbidity 2;Comorbidity 3+;Past/Current Experience;Time since onset of injury/illness/exacerbation    Comorbidities anxiety, depression, movement disorder    Examination-Activity Limitations Continence;Toileting    Examination-Participation Restrictions Community Activity;Shop;Interpersonal Relationship;Occupation   can't work currently due to pain/dysfunction   Stability/Clinical Decision Making Evolving/Moderate  complexity    Clinical Decision Making Moderate    Rehab Potential Fair    PT Frequency 1x / week    PT Duration 12 weeks    PT Treatment/Interventions ADLs/Self Care Home Management;Biofeedback;Electrical Stimulation;Cryotherapy;Moist Heat;Traction;Neuromuscular re-education;Therapeutic exercise;Therapeutic activities;Functional mobility training;Patient/family education;Manual techniques;Dry needling;Joint Manipulations;Spinal Manipulations;Passive range of motion    PT Next Visit Plan f/u on layer 1 self-massage, butterfly stretch, happy baby, quadruped on forearms breathing and bulge, is Pt comfortable with anorectal exam to access PF for release    PT Home Exercise Plan layer 1 self-massage    Consulted and Agree with Plan of Care Patient           Patient will benefit from skilled therapeutic intervention in order to improve the following deficits and impairments:  Abnormal gait, Pain, Increased fascial restricitons, Decreased range of motion, Increased muscle spasms, Postural dysfunction, Decreased strength, Impaired flexibility, Hypomobility, Impaired tone, Improper body mechanics  Visit Diagnosis: Other lack of coordination - Plan: PT plan of care cert/re-cert  Cramp and spasm - Plan: PT plan of care cert/re-cert  Chronic midline low back pain without sciatica - Plan: PT plan of care cert/re-cert     Problem List Patient Active Problem List   Diagnosis Date Noted  . Positive ANA (antinuclear antibody) 10/19/2019  . 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 Anorexia nervosa, restricting type, in full remission, moderate 02/16/2018  . History of depression 02/16/2018  . GAD (generalized anxiety disorder) 12/16/2017  . Pelvic floor dysfunction 12/16/2017  . Right hip pain 11/02/2017  . Grade 2 ankle sprain, right, initial encounter 05/16/2016  . 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 Right ankle pain 03/03/2015  . IBS  (irritable bowel syndrome) 05/25/2009    Morton Seth, PT 11/13/19 3:14 PM   [facility] [address] [place], [place], [address] Phone: [phone]   Fax:  [phone]  Name: Brittany [name] MRN: [id number] Date of Birth: 15-Sep-1990

## 2019-11-14 ENCOUNTER — Other Ambulatory Visit: Payer: Self-pay | Admitting: Gastroenterology

## 2019-11-14 ENCOUNTER — Ambulatory Visit
Admission: RE | Admit: 2019-11-14 | Discharge: 2019-11-14 | Disposition: A | Discharge status: Home / Self Care | Payer: 59 | Source: Ambulatory Visit | Attending: Gastroenterology | Admitting: Gastroenterology

## 2019-11-14 DIAGNOSIS — K5900 Constipation, unspecified: Secondary | ICD-10-CM

## 2019-11-16 ENCOUNTER — Ambulatory Visit
Admission: RE | Admit: 2019-11-16 | Discharge: 2019-11-16 | Disposition: A | Discharge status: Home / Self Care | Payer: 59 | Source: Ambulatory Visit | Attending: Gastroenterology | Admitting: Gastroenterology

## 2019-11-16 ENCOUNTER — Other Ambulatory Visit: Payer: Self-pay | Admitting: Gastroenterology

## 2019-11-16 DIAGNOSIS — K5900 Constipation, unspecified: Secondary | ICD-10-CM

## 2019-11-20 ENCOUNTER — Other Ambulatory Visit: Payer: Self-pay

## 2019-11-20 ENCOUNTER — Encounter: Payer: Self-pay | Admitting: Physical Therapy

## 2019-11-20 ENCOUNTER — Ambulatory Visit: Payer: 59 | Admitting: Physical Therapy

## 2019-11-20 DIAGNOSIS — M545 Low back pain, unspecified: Secondary | ICD-10-CM

## 2019-11-20 DIAGNOSIS — R278 Other lack of coordination: Secondary | ICD-10-CM | POA: Diagnosis not present

## 2019-11-20 DIAGNOSIS — R252 Cramp and spasm: Secondary | ICD-10-CM

## 2019-11-20 DIAGNOSIS — G8929 Other chronic pain: Secondary | ICD-10-CM

## 2019-11-20 NOTE — Therapy (Signed)
[facility] [address] [place], [place], [address] Phone: [phone]   Fax:  [phone]  Physical Therapy Treatment  Patient Details  Name: Brittany [name] MRN: [id number] Date of Birth: May 16, 1990 Referring Provider (PT): Orland Mustard, MD   Encounter Date: 11/20/2019   PT End of Session - 11/20/19 1101    Visit Number 2    Date for PT Re-Evaluation 02/05/20    Authorization Type [employer]    Authorization - Visit Number 2    Authorization - Number of Visits 60    PT Start Time 1100    PT Stop Time 1145    PT Time Calculation (min) 45 min    Activity Tolerance Patient tolerated treatment well;Patient limited by pain    Behavior During Therapy [facility] for tasks assessed/performed;Agitated;Anxious           Past Medical History:  Diagnosis Date  . Ankle injury   . Anxiety   . Eating disorder   . Frequent headaches     Past Surgical History:  Procedure Laterality Date  . ANKLE SURGERY  2018  . HERNIA REPAIR    . WISDOM TOOTH EXTRACTION      There were no vitals filed for this visit.   Subjective Assessment - 11/20/19 1101    Subjective I have done external massage and am still using the wand.  If I do the rectal wand first, I tend to get more relief and progress with vaginal work, but the rectal work takes forever and hurts.    Pertinent History Anorectal manometry: PF dysenergia, IBS, + ANA, Benign Hypermobility Syndrome, Functional Movement Disorder, Hx of anorexia, depression, Rt ankle injury    Patient Stated Goals improve defecation and time/place to defecation, understand rectocele and how to address, be able to be more intimate with my husband (insertion)    Currently in Pain? No/denies    Pain Descriptors / Indicators Tightness                          Pelvic Floor Special Questions - 11/20/19 0001    Pelvic Floor Internal Exam PT explained assessment and Pt gave  verbal consent    Exam Type Rectal             [facility] Adult PT Treatment/Exercise - 11/20/19 0001      Self-Care   Self-Care Other Self-Care Comments    Other Self-Care Comments  toileting using external backdrop for defecation, vagus nerve stimulation      Neuro Re-ed    Neuro Re-ed Details  diaphragmatic breathing in SL with bulge "push my finger out" of rectal canal, quadruped rocking avoiding pelvic tilt, let SITS bones go wide x 5 x 5sec      Exercises   Exercises Lumbar;Knee/Hip      Lumbar Exercises: Stretches   Other Lumbar Stretch Exercise 3-way happy baby: knees bent, adductor stretch bil, hamstring stretch bil in supine      Manual Therapy   Manual Therapy Soft tissue mobilization    Soft tissue mobilization coccygeus and levator ani release bil with Pt consent                    PT Short Term Goals - 11/13/19 1505      PT SHORT TERM GOAL #1   Title Pt will be ind with intial HEP for PF stretching, self-massage and release techniques through positional breathing and  visualization.    Time 4    Period Weeks    Status New    Target Date 12/11/19      PT SHORT TERM GOAL #2   Title Pt will achieve Rt hip ROM WNL symmetrical to Lt hip    Time 6    Period Weeks    Status New    Target Date 12/25/19      PT SHORT TERM GOAL #3   Title Pt will demo proper toileting posture, breathwork and bulge ability for improved defecation.    Time 6    Period Weeks    Status New    Target Date 12/25/19             PT Long Term Goals - 11/13/19 1508      PT LONG TERM GOAL #1   Title Pt will be ind with self-care techniques and HEP for optimal PF and trunk mobility and function    Time 12    Period Weeks    Status New    Target Date 02/05/20      PT LONG TERM GOAL #2   Title Pt will report improved ease with defecation at least 3 times a week, taking < 1 hour for evacuation of bowels.    Time 12    Period Weeks    Status New    Target Date 02/05/20       PT LONG TERM GOAL #3   Title Pt will be able to tolerate use of pelvic wand or up to size 4 dilator to improve tolerance of speculum exam or penetrative intercourse.    Time 12    Period Weeks    Status New    Target Date 02/05/20      PT LONG TERM GOAL #4   Title Pt will report improved low back and pelvic pain by at least 50% with ADLs and toileting.    Time 12    Period Weeks    Status New    Target Date 02/05/20      PT LONG TERM GOAL #5   Title Pt will be able to demo improved excursion of PF with contract and bulge for continence and void/defecation success.    Time 12    Period Weeks    Status New    Target Date 02/05/20                 Plan - 11/20/19 1633    Clinical Impression Statement Pt returns for first follow up visit since initial eval.  She continues to use pelvic wand for vaginal and anorectal internal releases.  PT introduced pelvic floor stretching.  Pt consented for anorectal assessment today which revealed good bulge, poor contract, and tight/tender PF muscles bil at coccygeus and levator ani.  PT discussed connection of high anxiety and PF tension.  Vagus nerve stim was discussed to see if Pt can work on calming her nervous system given she has a good bulge but signif difficulty with defecation.  Given degree of pelvic distension with bulge, PT recommended external splinting along with release tool to direct bulge pressure toward anus.  Continue along POC with ongoing assessment.    Comorbidities anxiety, depression, movement disorder    PT Frequency 1x / week    PT Duration 12 weeks    PT Treatment/Interventions ADLs/Self Care Home Management;Biofeedback;Electrical Stimulation;Cryotherapy;Moist Heat;Traction;Neuromuscular re-education;Therapeutic exercise;Therapeutic activities;Functional mobility training;Patient/family education;Manual techniques;Dry needling;Joint Manipulations;Spinal Manipulations;Passive range of motion    PT Next  Visit Plan did  Pt try external splinting along with release tool to direct pressure, continue anorectal release and layer 1 massage, give vagus nerve stim handout    PT Home Exercise Plan layer 1 self-massage, PF stretches    Consulted and Agree with Plan of Care Patient           Patient will benefit from skilled therapeutic intervention in order to improve the following deficits and impairments:     Visit Diagnosis: Other lack of coordination  Cramp and spasm  Chronic midline low back pain without sciatica     Problem List Patient Active Problem List   Diagnosis Date Noted  . Positive ANA (antinuclear antibody) 10/19/2019  . 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 Anorexia nervosa, restricting type, in full remission, moderate 02/16/2018  . History of depression 02/16/2018  . GAD (generalized anxiety disorder) 12/16/2017  . Pelvic floor dysfunction 12/16/2017  . Right hip pain 11/02/2017  . Grade 2 ankle sprain, right, initial encounter 05/16/2016  . 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 Right ankle pain 03/03/2015  . IBS (irritable bowel syndrome) 05/25/2009    Morton Geoghegan, PT 11/20/19 4:39 PM   [facility] [address], [address] [place], [place], [address] Phone: [phone]   Fax:  [phone]  Name: Brittany [name] MRN: [id number] Date of Birth: January 22, 1990

## 2019-11-26 ENCOUNTER — Encounter: Payer: Self-pay | Admitting: Family Medicine

## 2019-11-26 ENCOUNTER — Encounter: Payer: 59 | Admitting: Physical Therapy

## 2019-12-03 ENCOUNTER — Other Ambulatory Visit: Payer: Self-pay | Admitting: Family Medicine

## 2019-12-03 MED ORDER — BACLOFEN 20 MG PO TABS: ORAL_TABLET | ORAL | 1 refills | Status: DC

## 2019-12-04 ENCOUNTER — Ambulatory Visit: Payer: 59 | Admitting: Physical Therapy

## 2019-12-04 ENCOUNTER — Other Ambulatory Visit: Payer: Self-pay

## 2019-12-04 ENCOUNTER — Encounter: Payer: Self-pay | Admitting: Physical Therapy

## 2019-12-04 DIAGNOSIS — G8929 Other chronic pain: Secondary | ICD-10-CM

## 2019-12-04 DIAGNOSIS — M545 Low back pain, unspecified: Secondary | ICD-10-CM

## 2019-12-04 DIAGNOSIS — R278 Other lack of coordination: Secondary | ICD-10-CM | POA: Diagnosis not present

## 2019-12-04 DIAGNOSIS — R252 Cramp and spasm: Secondary | ICD-10-CM

## 2019-12-04 NOTE — Therapy (Signed)
[facility] [address] [place], [place], [address] Phone: [phone]   Fax:  [phone]  Physical Therapy Treatment  Patient Details  Name: Brittany [name] MRN: [id number] Date of Birth: 1990-04-27 Referring Provider (PT): Orland Mustard, MD   Encounter Date: 12/04/2019   PT End of Session - 12/04/19 1703    Visit Number 3    Date for PT Re-Evaluation 02/05/20    Authorization Type [employer]    Authorization - Visit Number 3    Authorization - Number of Visits 60    PT Start Time 1535    PT Stop Time 1613    PT Time Calculation (min) 38 min    Activity Tolerance Patient tolerated treatment well    Behavior During Therapy [facility] for tasks assessed/performed;Agitated;Anxious           Past Medical History:  Diagnosis Date  . Ankle injury   . Anxiety   . Eating disorder   . Frequent headaches     Past Surgical History:  Procedure Laterality Date  . ANKLE SURGERY  2018  . HERNIA REPAIR    . WISDOM TOOTH EXTRACTION      There were no vitals filed for this visit.   Subjective Assessment - 12/04/19 1537    Subjective I have pulled my back and didn't even do anything.  I messaged my doctor to see about a muscle relaxer to see if it would help my PF.    Pertinent History Anorectal manometry: PF dysenergia, IBS, + ANA, Benign Hypermobility Syndrome, Functional Movement Disorder, Hx of anorexia, depression, Rt ankle injury    Patient Stated Goals improve defecation and time/place to defecation, understand rectocele and how to address, be able to be more intimate with my husband (insertion)                             [facility] Adult PT Treatment/Exercise - 12/04/19 0001      Self-Care   Self-Care Other Self-Care Comments    Other Self-Care Comments  meet your body where it is for self-care/internal self-release      Manual Therapy   Manual Therapy Soft tissue mobilization;Joint  mobilization;Myofascial release;Passive ROM    Joint Mobilization gentle thoracic PAs and rib springing, Rt SI joint glides Gr I/II    Soft tissue mobilization abdominal massage, Rt adductor, Rt diaphragm release, Rt paraspinal broadening and stripping thoracic and lumbar    Myofascial Release surrounding ribcage obliques and thoracodorsal fascia    Passive ROM passive Rt quad and hip flexor stretching                    PT Short Term Goals - 12/04/19 1710      PT SHORT TERM GOAL #1   Title Pt will be ind with intial HEP for PF stretching, self-massage and release techniques through positional breathing and visualization.    Status On-going      PT SHORT TERM GOAL #2   Title Pt will achieve Rt hip ROM WNL symmetrical to Lt hip    Status Achieved      PT SHORT TERM GOAL #3   Title Pt will demo proper toileting posture, breathwork and bulge ability for improved defecation.    Status On-going             PT Long Term Goals - 11/13/19 1508      PT LONG TERM  GOAL #1   Title Pt will be ind with self-care techniques and HEP for optimal PF and trunk mobility and function    Time 12    Period Weeks    Status New    Target Date 02/05/20      PT LONG TERM GOAL #2   Title Pt will report improved ease with defecation at least 3 times a week, taking < 1 hour for evacuation of bowels.    Time 12    Period Weeks    Status New    Target Date 02/05/20      PT LONG TERM GOAL #3   Title Pt will be able to tolerate use of pelvic wand or up to size 4 dilator to improve tolerance of speculum exam or penetrative intercourse.    Time 12    Period Weeks    Status New    Target Date 02/05/20      PT LONG TERM GOAL #4   Title Pt will report improved low back and pelvic pain by at least 50% with ADLs and toileting.    Time 12    Period Weeks    Status New    Target Date 02/05/20      PT LONG TERM GOAL #5   Title Pt will be able to demo improved excursion of PF with contract and  bulge for continence and void/defecation success.    Time 12    Period Weeks    Status New    Target Date 02/05/20                 Plan - 12/04/19 1704    Clinical Impression Statement Pt continues to experience high levels of anxiety and experience of movement disorder with report of "no outlet options for stress" as her pain limits her from running and singing.  PT focused on Rt hip and SI joint mobiity and release of Rt paraspinals and fascial restrictions of trunk today.  Reduced paraspinal tone and torsion of ribcage end of session.  Pt had improved gut sounds with abdominal massage today and improved symmetry of ribcage resting position following manual therapy techniques.  Pt and PT discussed how she may benefit from finding other outlets for anxiety management and consider finding a therapist to help her understand other tools to reduce nervous system impact on physical state.  PT encouaged Pt to try internal release work after PT session today as she had pain relief and was more relaxed which likely will allow for improved success with home strategies.    Comorbidities anxiety, depression, movement disorder    PT Frequency 1x / week    PT Duration 12 weeks    PT Treatment/Interventions ADLs/Self Care Home Management;Biofeedback;Electrical Stimulation;Cryotherapy;Moist Heat;Traction;Neuromuscular re-education;Therapeutic exercise;Therapeutic activities;Functional mobility training;Patient/family education;Manual techniques;Dry needling;Joint Manipulations;Spinal Manipulations;Passive range of motion    PT Next Visit Plan continue manual techniques to abdominal wall and trunk, layer 1 and/or internal release as tol, movement with respiration cueing    PT Home Exercise Plan layer 1 self-massage, PF stretches    Consulted and Agree with Plan of Care Patient           Patient will benefit from skilled therapeutic intervention in order to improve the following deficits and  impairments:     Visit Diagnosis: Other lack of coordination  Cramp and spasm  Chronic midline low back pain without sciatica     Problem List Patient Active Problem List   Diagnosis Date Noted  .  Positive ANA (antinuclear antibody) 10/19/2019  . 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 Anorexia nervosa, restricting type, in full remission, moderate 02/16/2018  . History of depression 02/16/2018  . GAD (generalized anxiety disorder) 12/16/2017  . Pelvic floor dysfunction 12/16/2017  . Right hip pain 11/02/2017  . Grade 2 ankle sprain, right, initial encounter 05/16/2016  . 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 Right ankle pain 03/03/2015  . IBS (irritable bowel syndrome) 05/25/2009    Morton Couse, PT 12/04/19 5:11 PM   [facility] [address] [place], [place], [address] Phone: [phone]   Fax:  [phone]  Name: Brittany [name] MRN: [id number] Date of Birth: October 17, 1990

## 2019-12-05 ENCOUNTER — Encounter: Payer: Self-pay | Admitting: Family Medicine

## 2019-12-11 ENCOUNTER — Other Ambulatory Visit: Payer: Self-pay

## 2019-12-11 ENCOUNTER — Ambulatory Visit: Payer: 59 | Attending: Family Medicine | Admitting: Physical Therapy

## 2019-12-11 ENCOUNTER — Encounter: Payer: Self-pay | Admitting: Physical Therapy

## 2019-12-11 DIAGNOSIS — R278 Other lack of coordination: Secondary | ICD-10-CM | POA: Diagnosis present

## 2019-12-11 DIAGNOSIS — G8929 Other chronic pain: Secondary | ICD-10-CM | POA: Diagnosis present

## 2019-12-11 DIAGNOSIS — R252 Cramp and spasm: Secondary | ICD-10-CM | POA: Diagnosis present

## 2019-12-11 DIAGNOSIS — M545 Low back pain, unspecified: Secondary | ICD-10-CM | POA: Insufficient documentation

## 2019-12-11 NOTE — Therapy (Signed)
[facility] [address] [place], [place], [address] Phone: [phone]   Fax:  [phone]  Physical Therapy Treatment  Patient Details  Name: Brittany [name] MRN: [id number] Date of Birth: February 06, 1990 Referring Provider (PT): Orland Mustard, MD   Encounter Date: 12/11/2019   PT End of Session - 12/11/19 1100    Visit Number 4    Date for PT Re-Evaluation 02/05/20    Authorization Type [employer]    Authorization - Visit Number 4    Authorization - Number of Visits 60    PT Start Time 1100    PT Stop Time 1142    PT Time Calculation (min) 42 min    Activity Tolerance Patient tolerated treatment well    Behavior During Therapy [facility] for tasks assessed/performed;Agitated;Anxious           Past Medical History:  Diagnosis Date  . Ankle injury   . Anxiety   . Eating disorder   . Frequent headaches     Past Surgical History:  Procedure Laterality Date  . ANKLE SURGERY  2018  . HERNIA REPAIR    . WISDOM TOOTH EXTRACTION      There were no vitals filed for this visit.   Subjective Assessment - 12/11/19 1100    Subjective My back is doing better but feels twisted again.  Rt hip continues to bother me.  Pelvic floor self-release is still hit or miss.  My gut is active today so I am not sure if I can do pelvic floor treatment today.    Pertinent History Anorectal manometry: PF dysenergia, IBS, + ANA, Benign Hypermobility Syndrome, Functional Movement Disorder, Hx of anorexia, depression, Rt ankle injury    Patient Stated Goals improve defecation and time/place to defecation, understand rectocele and how to address, be able to be more intimate with my husband (insertion)    Currently in Pain? No/denies    Pain Descriptors / Indicators Tightness                             [facility] Adult PT Treatment/Exercise - 12/11/19 0001      Self-Care   Self-Care Other Self-Care Comments     Other Self-Care Comments  ongoing conversations about slow re-introduction of activity, finding outlets for stress/anxiety management      Manual Therapy   Manual Therapy Passive ROM;Soft tissue mobilization    Soft tissue mobilization Rt adductors    Myofascial Release lumbar and thoracic paraspinals and thoracodorsal fascia    Passive ROM Rt hip flexion, ER                    PT Short Term Goals - 12/04/19 1710      PT SHORT TERM GOAL #1   Title Pt will be ind with intial HEP for PF stretching, self-massage and release techniques through positional breathing and visualization.    Status On-going      PT SHORT TERM GOAL #2   Title Pt will achieve Rt hip ROM WNL symmetrical to Lt hip    Status Achieved      PT SHORT TERM GOAL #3   Title Pt will demo proper toileting posture, breathwork and bulge ability for improved defecation.    Status On-going             PT Long Term Goals - 11/13/19 1508      PT LONG TERM GOAL #  1   Title Pt will be ind with self-care techniques and HEP for optimal PF and trunk mobility and function    Time 12    Period Weeks    Status New    Target Date 02/05/20      PT LONG TERM GOAL #2   Title Pt will report improved ease with defecation at least 3 times a week, taking < 1 hour for evacuation of bowels.    Time 12    Period Weeks    Status New    Target Date 02/05/20      PT LONG TERM GOAL #3   Title Pt will be able to tolerate use of pelvic wand or up to size 4 dilator to improve tolerance of speculum exam or penetrative intercourse.    Time 12    Period Weeks    Status New    Target Date 02/05/20      PT LONG TERM GOAL #4   Title Pt will report improved low back and pelvic pain by at least 50% with ADLs and toileting.    Time 12    Period Weeks    Status New    Target Date 02/05/20      PT LONG TERM GOAL #5   Title Pt will be able to demo improved excursion of PF with contract and bulge for continence and void/defecation  success.    Time 12    Period Weeks    Status New    Target Date 02/05/20                 Plan - 12/11/19 1127    Clinical Impression Statement Pt with ongoing flexibility restrictions in Rt adductors, medial hamstrings and quads compared to Lt.  Rt hip ER is limited with firm end feel.  Pelvic alignment was symmetrical today and some return of thoracic torsion was present but to less degree than last visit.  Pt declined trying short duration recumbent bike today secondary to fear of flare up of pain.  She had some increased gut activity/bowel activity today which prevented direct pelvic floor treatment. Manual techniques for thoracic and lumbar mobility used today along Rt soft tissues and lacrosse ball used for lateral hip TP releaes on Rt.  Pt felt great relief with TP release.  Continue along POC with ongoing assessment of response to treatment.    Comorbidities anxiety, depression, movement disorder    PT Frequency 1x / week    PT Duration 12 weeks    PT Treatment/Interventions ADLs/Self Care Home Management;Biofeedback;Electrical Stimulation;Cryotherapy;Moist Heat;Traction;Neuromuscular re-education;Therapeutic exercise;Therapeutic activities;Functional mobility training;Patient/family education;Manual techniques;Dry needling;Joint Manipulations;Spinal Manipulations;Passive range of motion    PT Next Visit Plan lax ball and foam roller release, roller or Adaday to adductors and ham on Rt, continue manual techniques to abdominal wall and trunk, layer 1 and/or internal release as tol, movement with respiration cueing    PT Home Exercise Plan layer 1 self-massage, PF stretches    Consulted and Agree with Plan of Care Patient           Patient will benefit from skilled therapeutic intervention in order to improve the following deficits and impairments:     Visit Diagnosis: Other lack of coordination  Cramp and spasm  Chronic midline low back pain without  sciatica     Problem List Patient Active Problem List   Diagnosis Date Noted  . Positive ANA (antinuclear antibody) 10/19/2019  . 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 Anorexia nervosa,  restricting type, in full remission, moderate 02/16/2018  . History of depression 02/16/2018  . GAD (generalized anxiety disorder) 12/16/2017  . Pelvic floor dysfunction 12/16/2017  . Right hip pain 11/02/2017  . Grade 2 ankle sprain, right, initial encounter 05/16/2016  . 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 Right ankle pain 03/03/2015  . IBS (irritable bowel syndrome) 05/25/2009    Morton Einspahr, PT 12/11/19 11:47 AM   [facility] [address], [address] [place], [place], [address] Phone: [phone]   Fax:  [phone]  Name: Jeraldean Wechter MRN: [id number] Date of Birth: 08/09/1990

## 2019-12-18 ENCOUNTER — Ambulatory Visit: Payer: 59 | Admitting: Physical Therapy

## 2019-12-18 ENCOUNTER — Other Ambulatory Visit: Payer: Self-pay

## 2019-12-18 ENCOUNTER — Encounter: Payer: Self-pay | Admitting: Physical Therapy

## 2019-12-18 DIAGNOSIS — R252 Cramp and spasm: Secondary | ICD-10-CM

## 2019-12-18 DIAGNOSIS — G8929 Other chronic pain: Secondary | ICD-10-CM

## 2019-12-18 DIAGNOSIS — R278 Other lack of coordination: Secondary | ICD-10-CM

## 2019-12-18 DIAGNOSIS — M545 Low back pain, unspecified: Secondary | ICD-10-CM

## 2019-12-18 NOTE — Patient Instructions (Signed)
Access Code: [email or web address] URL: [email or web address] Date: 12/18/2019 Prepared by: Morton Hessler  Exercises Figure 4 Bridge - 1 x daily - 7 x weekly - 2 sets - 5 reps Sidelying Reverse Clamshell - 1 x daily - 7 x weekly - 2 sets - 5 reps

## 2019-12-18 NOTE — Therapy (Signed)
[facility] [address] [place], [place], [address] Phone: [phone]   Fax:  [phone]  Physical Therapy Treatment  Patient Details  Name: Brittany [name] MRN: [id number] Date of Birth: 1990/09/12 Referring Provider (PT): Orma Flaming, MD   Encounter Date: 12/18/2019   PT End of Session - 12/18/19 1100    Visit Number 5    Date for PT Re-Evaluation 02/05/20    Authorization Type [employer]    Authorization - Visit Number 5    Authorization - Number of Visits 60    PT Start Time 1100    PT Stop Time 4656    PT Time Calculation (min) 45 min    Activity Tolerance Patient tolerated treatment well    Behavior During Therapy [facility] for tasks assessed/performed;Agitated;Anxious           Past Medical History:  Diagnosis Date  . Ankle injury   . Anxiety   . Eating disorder   . Frequent headaches     Past Surgical History:  Procedure Laterality Date  . ANKLE SURGERY  2018  . HERNIA REPAIR    . WISDOM TOOTH EXTRACTION      There were no vitals filed for this visit.   Subjective Assessment - 12/18/19 1103    Subjective I am feeling more aligned and less twisted in my mid-back. I tried to jog a little bit but got twisted badly again and had to take ibuprofen due to back pain.  I want to learn how to manage this on my own better.    Pertinent History Anorectal manometry: PF dysenergia, IBS, + ANA, Benign Hypermobility Syndrome, Functional Movement Disorder, Hx of anorexia, depression, Rt ankle injury    Patient Stated Goals improve defecation and time/place to defecation, understand rectocele and how to address, be able to be more intimate with my husband (insertion)                             [place] Adult PT Treatment/Exercise - 12/18/19 0001      Self-Care   Self-Care Other Self-Care Comments    Other Self-Care Comments  PT used Pt's phone to video soft tissue techniques and  MET for home care/management per Pt's request      Exercises   Exercises Lumbar;Knee/Hip;Shoulder      Lumbar Exercises: Sidelying   Other Sidelying Lumbar Exercises reverse clam on right with pelvic stack cue 2x5      Knee/Hip Exercises: Stretches   Other Knee/Hip Stretches standing side lunge stretch bil 1x10 for adductors      Knee/Hip Exercises: Supine   Single Leg Bridge Strengthening;Right;2 sets;5 reps      Manual Therapy   Manual Therapy Muscle Energy Technique;Myofascial release    Myofascial Release to address Rt lumbar torsion, diaphragm release on Lt    Muscle Energy Technique Rt innominant anteriorly rotated, self-instruction and video with Pt's phone for self-MET at home                    PT Short Term Goals - 12/18/19 1101      PT SHORT TERM GOAL #1   Title Pt will be ind with intial HEP for PF stretching, self-massage and release techniques through positional breathing and visualization.    Status Achieved      PT SHORT TERM GOAL #2   Title Pt will achieve Rt hip ROM WNL symmetrical to  Lt hip    Status Achieved      PT SHORT TERM GOAL #3   Title Pt will demo proper toileting posture, breathwork and bulge ability for improved defecation.    Status Achieved             PT Long Term Goals - 12/18/19 1101      PT LONG TERM GOAL #1   Title Pt will be ind with self-care techniques and HEP for optimal PF and trunk mobility and function    Status On-going      PT LONG TERM GOAL #2   Title Pt will report improved ease with defecation at least 3 times a week, taking < 1 hour for evacuation of bowels.    Status On-going      PT LONG TERM GOAL #3   Title Pt will be able to tolerate use of pelvic wand or up to size 4 dilator to improve tolerance of speculum exam or penetrative intercourse.    Baseline varies in pain/ability to relax, no pentrative intercourse due to pain    Status On-going      PT LONG TERM GOAL #4   Title Pt will report improved  low back and pelvic pain by at least 50% with ADLs and toileting.    Status On-going      PT LONG TERM GOAL #5   Title Pt will be able to demo improved excursion of PF with contract and bulge for continence and void/defecation success.    Baseline Pt is able to bulge well    Status Achieved                 Plan - 12/18/19 1150    Clinical Impression Statement Session spent today addressing lumbar and thoracic torsion with MET and myofascial release techniques to aim for improved alignment.  PT instructed Pt on self-MET and took video using her phone per her request while instructing.  She had anterior rotation and outflare of Rt innominant today which improved with MET.  PT added Rt sided fig 4 bridge and reverse clam to address weakness areas to counter tendency of hip flexors and adductors to pull her out of alignment.  She has myofascial tension and Lt>Rt diaphragm restriction contributing to thoracic torsion.  She continues to be limited in all activites secondary to back and pelvic pain.  PT and Pt discussed that working from the outside inward is likely best due to Pt's high stress/anxiety which will likely prevent success with pelvic floor release until lumbar and pelvic pain is more improved.    Comorbidities anxiety, depression, movement disorder    PT Next Visit Plan ERO? recheck pelvic alignment, MET as needed, review Rt fig 4 bridge and Rt reverse clam, Lt diaphragm release, seated thoracic sideglides and rotation    PT Home Exercise Plan layer 1 self-massage, PF stretches    Consulted and Agree with Plan of Care Patient           Patient will benefit from skilled therapeutic intervention in order to improve the following deficits and impairments:     Visit Diagnosis: Other lack of coordination  Cramp and spasm  Chronic midline low back pain without sciatica     Problem List Patient Active Problem List   Diagnosis Date Noted  . Positive ANA (antinuclear  antibody) 10/19/2019  . 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 Anorexia nervosa, restricting type, in full remission, moderate 02/16/2018  . History of depression 02/16/2018  .  GAD (generalized anxiety disorder) 12/16/2017  . Pelvic floor dysfunction 12/16/2017  . Right hip pain 11/02/2017  . Grade 2 ankle sprain, right, initial encounter 05/16/2016  . 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 Right ankle pain 03/03/2015  . IBS (irritable bowel syndrome) 05/25/2009    Baruch Merl, PT 12/18/19 11:57 AM   [facility] [address] [place], [place], [address] Phone: [phone]   Fax:  [phone]  Name: Brittany [name] MRN: [id number] Date of Birth: 1990/11/02

## 2019-12-25 ENCOUNTER — Other Ambulatory Visit: Payer: Self-pay

## 2019-12-25 ENCOUNTER — Encounter: Payer: Self-pay | Admitting: Physical Therapy

## 2019-12-25 ENCOUNTER — Ambulatory Visit: Payer: 59 | Admitting: Physical Therapy

## 2019-12-25 DIAGNOSIS — G8929 Other chronic pain: Secondary | ICD-10-CM

## 2019-12-25 DIAGNOSIS — R278 Other lack of coordination: Secondary | ICD-10-CM

## 2019-12-25 DIAGNOSIS — M545 Low back pain, unspecified: Secondary | ICD-10-CM

## 2019-12-25 DIAGNOSIS — R252 Cramp and spasm: Secondary | ICD-10-CM

## 2019-12-25 NOTE — Therapy (Signed)
[facility] [address] [place], [place], [address] Phone: [phone]   Fax:  [phone]  Physical Therapy Treatment  Patient Details  Name: Brittany [name] MRN: [id number] Date of Birth: Nov 17, 1990 Referring Provider (PT): Orma Flaming, MD   Encounter Date: 12/25/2019   PT End of Session - 12/25/19 1150    Visit Number 6    Date for PT Re-Evaluation 02/05/20    Authorization Type [employer]    Authorization - Visit Number 6    Authorization - Number of Visits 60    PT Start Time 1751   Pt late   PT Stop Time 1230    PT Time Calculation (min) 39 min    Activity Tolerance Patient tolerated treatment well    Behavior During Therapy [facility] for tasks assessed/performed;Agitated;Anxious           Past Medical History:  Diagnosis Date  . Ankle injury   . Anxiety   . Eating disorder   . Frequent headaches     Past Surgical History:  Procedure Laterality Date  . ANKLE SURGERY  2018  . HERNIA REPAIR    . WISDOM TOOTH EXTRACTION      There were no vitals filed for this visit.   Subjective Assessment - 12/25/19 1151    Subjective I have been working on my pelvic alignment and I think it's better.    Pertinent History Anorectal manometry: PF dysenergia, IBS, + ANA, Benign Hypermobility Syndrome, Functional Movement Disorder, Hx of anorexia, depression, Rt ankle injury    Patient Stated Goals improve defecation and time/place to defecation, understand rectocele and how to address, be able to be more intimate with my husband (insertion)    Currently in Pain? Yes    Pain Score 5     Pain Location Back    Pain Orientation Right    Pain Descriptors / Indicators Tightness    Pain Type Chronic pain    Pain Onset More than a month ago              [facility] PT Assessment - 12/25/19 0001      AROM   AROM Assessment Site Thoracic    Lumbar Flexion palms to ground    Lumbar Extension 25, symmetrical     Lumbar - Right Side Bend 30, pain    Lumbar - Left Side Bend 30 no pain    Thoracic - Right Rotation 50% limited and painful    Thoracic - Left Rotation 30% limited, no pain      Strength   Overall Strength Comments Rt hip flexion 3+/5      Palpation   Spinal mobility less thoracic torsion after open books    SI assessment  symmetrical alignment today                         [facility] Adult PT Treatment/Exercise - 12/25/19 0001      Neuro Re-ed    Neuro Re-ed Details  TCs and VCs for breathing in open book, Rt pelvic PNF active, AAROM and resisted in Lt SL      Lumbar Exercises: Supine   Single Leg Bridge 5 reps   Rt only   Other Supine Lumbar Exercises thoracic ext over soft foam roller 5x at various segments      Lumbar Exercises: Sidelying   Clam Limitations Rt reverse clam x 5 reps    Other Sidelying Lumbar Exercises open  books x 5 with deep ribcage breathe and melt bil    Other Sidelying Lumbar Exercises Rt hip flexion knee at 90 2x5      Shoulder Exercises: Supine   Horizontal ABduction Strengthening;Both;10 reps;Theraband    Theraband Level (Shoulder Horizontal ABduction) Level 2 (Red)                    PT Short Term Goals - 12/18/19 1101      PT SHORT TERM GOAL #1   Title Pt will be ind with intial HEP for PF stretching, self-massage and release techniques through positional breathing and visualization.    Status Achieved      PT SHORT TERM GOAL #2   Title Pt will achieve Rt hip ROM WNL symmetrical to Lt hip    Status Achieved      PT SHORT TERM GOAL #3   Title Pt will demo proper toileting posture, breathwork and bulge ability for improved defecation.    Status Achieved             PT Long Term Goals - 12/18/19 1101      PT LONG TERM GOAL #1   Title Pt will be ind with self-care techniques and HEP for optimal PF and trunk mobility and function    Status On-going      PT LONG TERM GOAL #2   Title Pt will report improved ease  with defecation at least 3 times a week, taking < 1 hour for evacuation of bowels.    Status On-going      PT LONG TERM GOAL #3   Title Pt will be able to tolerate use of pelvic wand or up to size 4 dilator to improve tolerance of speculum exam or penetrative intercourse.    Baseline varies in pain/ability to relax, no pentrative intercourse due to pain    Status On-going      PT LONG TERM GOAL #4   Title Pt will report improved low back and pelvic pain by at least 50% with ADLs and toileting.    Status On-going      PT LONG TERM GOAL #5   Title Pt will be able to demo improved excursion of PF with contract and bulge for continence and void/defecation success.    Baseline Pt is able to bulge well    Status Achieved                 Plan - 12/25/19 1343    Clinical Impression Statement Pt arrived with improved SI symmetry using her MET home strategy taught last visit.  Her trunk ROM for lumbar is significantly improved with less pain.  She has limited Rt thoracic rotation with pain.  PT initiated open books and costal expansion breathwork today with improved ribcage symmetry and torsion afterwards.  Pt has some weakness surrounding Rt hemipelvis with improved recruitment of Rt hip flexor following pelvic PNF passive, active assisted and resisted.  Pt will need some hip flexor strengthening added to HEP and PT expressed hope with Pt that we can zone in on more pelvic floor treatment in the next visit or two.    Comorbidities anxiety, depression, movement disorder    PT Frequency 1x / week    PT Duration 12 weeks    PT Treatment/Interventions ADLs/Self Care Home Management;Biofeedback;Electrical Stimulation;Cryotherapy;Moist Heat;Traction;Neuromuscular re-education;Therapeutic exercise;Therapeutic activities;Functional mobility training;Patient/family education;Manual techniques;Dry needling;Joint Manipulations;Spinal Manipulations;Passive range of motion    PT Next Visit Plan f/u on Rt  open book,  check pelvic alignment, Rt pelvic PNF, give Rt hip flexor strength, thoracic mobility, pelvic floor treatment if ready    PT Home Exercise Plan [id number]    Consulted and Agree with Plan of Care Patient           Patient will benefit from skilled therapeutic intervention in order to improve the following deficits and impairments:     Visit Diagnosis: Other lack of coordination  Cramp and spasm  Chronic midline low back pain without sciatica     Problem List Patient Active Problem List   Diagnosis Date Noted  . Positive ANA (antinuclear antibody) 10/19/2019  . 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 Anorexia nervosa, restricting type, in full remission, moderate 02/16/2018  . History of depression 02/16/2018  . GAD (generalized anxiety disorder) 12/16/2017  . Pelvic floor dysfunction 12/16/2017  . Right hip pain 11/02/2017  . Grade 2 ankle sprain, right, initial encounter 05/16/2016  . 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 Right ankle pain 03/03/2015  . IBS (irritable bowel syndrome) 05/25/2009    Baruch Merl, PT 12/25/19 1:50 PM   [facility] [address], [place], [place], [address] Phone: [phone]   Fax:  [phone]  Name: Tearra Ouk MRN: [id number] Date of Birth: March 25, 1990

## 2020-01-09 ENCOUNTER — Ambulatory Visit: Payer: 59 | Attending: Family Medicine | Admitting: Physical Therapy

## 2020-01-09 ENCOUNTER — Other Ambulatory Visit: Payer: Self-pay

## 2020-01-09 ENCOUNTER — Encounter: Payer: Self-pay | Admitting: Physical Therapy

## 2020-01-09 DIAGNOSIS — R278 Other lack of coordination: Secondary | ICD-10-CM | POA: Diagnosis present

## 2020-01-09 DIAGNOSIS — R252 Cramp and spasm: Secondary | ICD-10-CM | POA: Insufficient documentation

## 2020-01-09 DIAGNOSIS — M545 Low back pain, unspecified: Secondary | ICD-10-CM | POA: Diagnosis present

## 2020-01-09 DIAGNOSIS — G8929 Other chronic pain: Secondary | ICD-10-CM | POA: Diagnosis present

## 2020-01-09 NOTE — Therapy (Signed)
[facility] [address], [address] [place], [place], [address] Phone: [phone]   Fax:  [phone]  Physical Therapy Treatment  Patient Details  Name: Brittany [name] MRN: [id number] Date of Birth: 09/29/1990 Referring Provider (PT): Orland Mustard, MD   Encounter Date: 01/09/2020   PT End of Session - 01/09/20 1305    Visit Number 7    Date for PT Re-Evaluation 02/05/20    Authorization Type [employer]    Authorization - Visit Number 7    Authorization - Number of Visits 60    PT Start Time [phone]    PT Stop Time 1016    PT Time Calculation (min) 39 min    Activity Tolerance Patient tolerated treatment well    Behavior During Therapy [facility] for tasks assessed/performed;Agitated;Anxious           Past Medical History:  Diagnosis Date  . Ankle injury   . Anxiety   . Eating disorder   . Frequent headaches     Past Surgical History:  Procedure Laterality Date  . ANKLE SURGERY  2018  . HERNIA REPAIR    . WISDOM TOOTH EXTRACTION      There were no vitals filed for this visit.   Subjective Assessment - 01/09/20 0934    Subjective Things are all over the place.  I feel like my back is slightly less twisted.  My pelvic alignment has already changed 3 times today.  I did meet with a psychiatrist yesterday who gave me some recommendations for someone to see.    Pertinent History Anorectal manometry: PF dysenergia, IBS, + ANA, Benign Hypermobility Syndrome, Functional Movement Disorder, Hx of anorexia, depression, Rt ankle injury    Patient Stated Goals improve defecation and time/place to defecation, understand rectocele and how to address, be able to be more intimate with my husband (insertion)    Currently in Pain? Yes    Pain Score 3     Pain Location Back    Pain Orientation Right    Pain Descriptors / Indicators Tightness    Pain Type Chronic pain    Pain Onset More than a month ago    Pain Frequency  Constant              [facility] PT Assessment - 01/09/20 0001      AROM   Thoracic - Right Rotation limited 30% compared to Lt rotation, no pain but tight      Palpation   SI assessment  symmetrical alignment today                         [facility] Adult PT Treatment/Exercise - 01/09/20 0001      Self-Care   Self-Care Other Self-Care Comments    Other Self-Care Comments  signif amount of time spent on discussion of need for multi-practioner treatment to address mental and emotional health to reduce barrier to physical wellness, discussion of goals with self-assessment for symmetry and use of HEP tools, limit focus on seeking perfect symmetry and find time to do activities that bring joy      Exercises   Exercises Lumbar      Lumbar Exercises: Standing   Other Standing Lumbar Exercises deep frog squat and prayer with deep breathing for PF drop    Other Standing Lumbar Exercises thoracic rotation Rt/Lt with dowel held across upper back x 20 reps      Lumbar Exercises: Seated  Other Seated Lumbar Exercises thoracic rotation with overpressure into Rt rotation    Other Seated Lumbar Exercises sidebending stretch with overhead reach bil 3x5 sec holds      Lumbar Exercises: Sidelying   Other Sidelying Lumbar Exercises open books x 3 with deep ribcage breathe and melt into Rt rotation only                    PT Short Term Goals - 12/18/19 1101      PT SHORT TERM GOAL #1   Title Pt will be ind with intial HEP for PF stretching, self-massage and release techniques through positional breathing and visualization.    Status Achieved      PT SHORT TERM GOAL #2   Title Pt will achieve Rt hip ROM WNL symmetrical to Lt hip    Status Achieved      PT SHORT TERM GOAL #3   Title Pt will demo proper toileting posture, breathwork and bulge ability for improved defecation.    Status Achieved             PT Long Term Goals - 12/18/19 1101      PT LONG TERM GOAL #1    Title Pt will be ind with self-care techniques and HEP for optimal PF and trunk mobility and function    Status On-going      PT LONG TERM GOAL #2   Title Pt will report improved ease with defecation at least 3 times a week, taking < 1 hour for evacuation of bowels.    Status On-going      PT LONG TERM GOAL #3   Title Pt will be able to tolerate use of pelvic wand or up to size 4 dilator to improve tolerance of speculum exam or penetrative intercourse.    Baseline varies in pain/ability to relax, no pentrative intercourse due to pain    Status On-going      PT LONG TERM GOAL #4   Title Pt will report improved low back and pelvic pain by at least 50% with ADLs and toileting.    Status On-going      PT LONG TERM GOAL #5   Title Pt will be able to demo improved excursion of PF with contract and bulge for continence and void/defecation success.    Baseline Pt is able to bulge well    Status Achieved                 Plan - 01/09/20 1309    Clinical Impression Statement Pt arrived with report of "hips, pelvis and back being all over the place."  She stated her pelvic alignment had changed 3 times already this morning and she had spent all morning trying to fix her torsions.  PT and Pt had a long discussion with PT encouraging Pt to discover multi-disciplinary practitioners to help her with her mental health conditions of OCD and anxiety as these are likely feeding into her hyperfocus on her body, its state, and her physical experience of pain.  PT feels these aspects are creating barriers to her progress on physical progress.  PT found good alignment of pelvis today and Pt said that was because she worked on it all morning.  She continues to have some stiffness into Rt trunk rotation which improves with stretching.  PT has not been able to address pelvic floor dysfunction directly secondary to heightened state of nervous system and overriding pain in trunk and pelvis.  Continue  targeting  streamlined mobility routine and strategies for pain managment.    Comorbidities anxiety, depression, movement disorder    Stability/Clinical Decision Making Evolving/Moderate complexity    Rehab Potential Fair    PT Frequency 1x / week    PT Duration 12 weeks    PT Treatment/Interventions ADLs/Self Care Home Management;Biofeedback;Electrical Stimulation;Cryotherapy;Moist Heat;Traction;Neuromuscular re-education;Therapeutic exercise;Therapeutic activities;Functional mobility training;Patient/family education;Manual techniques;Dry needling;Joint Manipulations;Spinal Manipulations;Passive range of motion    PT Next Visit Plan continue manual techniques, pelvic floor treatment if ready, stress/anxiety management strategies    PT Home Exercise Plan [email or web address]    Consulted and Agree with Plan of Care Patient           Patient will benefit from skilled therapeutic intervention in order to improve the following deficits and impairments:     Visit Diagnosis: Other lack of coordination  Chronic midline low back pain without sciatica  Cramp and spasm     Problem List Patient Active Problem List   Diagnosis Date Noted  . Positive ANA (antinuclear antibody) 10/19/2019  . 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 Anorexia nervosa, restricting type, in full remission, moderate 02/16/2018  . History of depression 02/16/2018  . GAD (generalized anxiety disorder) 12/16/2017  . Pelvic floor dysfunction 12/16/2017  . Right hip pain 11/02/2017  . Grade 2 ankle sprain, right, initial encounter 05/16/2016  . 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 Right ankle pain 03/03/2015  . IBS (irritable bowel syndrome) 05/25/2009    Baruch Merl, PT 01/09/20 2:13 PM   [facility] [address] [place], [place], [address] Phone: [phone]   Fax:  [phone]  Name: Brittany [name] MRN: [id number] Date of Birth: 11-Aug-1990

## 2020-01-15 ENCOUNTER — Encounter: Payer: Self-pay | Admitting: Physical Therapy

## 2020-01-15 ENCOUNTER — Other Ambulatory Visit: Payer: Self-pay

## 2020-01-15 ENCOUNTER — Ambulatory Visit: Payer: 59 | Admitting: Physical Therapy

## 2020-01-15 DIAGNOSIS — R278 Other lack of coordination: Secondary | ICD-10-CM

## 2020-01-15 DIAGNOSIS — M545 Low back pain, unspecified: Secondary | ICD-10-CM

## 2020-01-15 DIAGNOSIS — R252 Cramp and spasm: Secondary | ICD-10-CM

## 2020-01-15 DIAGNOSIS — G8929 Other chronic pain: Secondary | ICD-10-CM

## 2020-01-15 NOTE — Therapy (Signed)
[facility] [address] [place], [place], [address] Phone: [phone]   Fax:  [phone]  Physical Therapy Treatment  Patient Details  Name: Brittany [name] MRN: [id number] Date of Birth: July 28, 1990 Referring Provider (PT): Orland Mustard, MD   Encounter Date: 01/15/2020   PT End of Session - 01/15/20 1142    Visit Number 8    Date for PT Re-Evaluation 02/05/20    Authorization Type [employer]    Authorization - Number of Visits 60    PT Start Time 1145    PT Stop Time 1229    PT Time Calculation (min) 44 min    Activity Tolerance Patient tolerated treatment well    Behavior During Therapy [facility] for tasks assessed/performed;Agitated;Anxious           Past Medical History:  Diagnosis Date   Ankle injury    Anxiety    Eating disorder    Frequent headaches     Past Surgical History:  Procedure Laterality Date   ANKLE SURGERY  2018   HERNIA REPAIR     WISDOM TOOTH EXTRACTION      There were no vitals filed for this visit.   Subjective Assessment - 01/15/20 1143    Subjective This week has been better than usual.  My guy patterns have been more manageable.  I have tried some more physical things since we talked and did not feel like I wrecked my body.  I am less hectically checking my alignment and torsions.  I do still have to work on my twisted back.    Pertinent History Anorectal manometry: PF dysenergia, IBS, + ANA, Benign Hypermobility Syndrome, Functional Movement Disorder, Hx of anorexia, depression, Rt ankle injury    Patient Stated Goals improve defecation and time/place to defecation, understand rectocele and how to address, be able to be more intimate with my husband (insertion)    Currently in Pain? Yes    Pain Score 3     Pain Location Back    Pain Orientation Right    Pain Descriptors / Indicators Tightness    Pain Type Chronic pain    Pain Onset More than a month ago     Pain Frequency Constant                             [facility] Adult PT Treatment/Exercise - 01/15/20 0001      Exercises   Exercises Lumbar;Shoulder;Knee/Hip      Lumbar Exercises: Stretches   Other Lumbar Stretch Exercise manual guided by PT: standing thoracic flexion with lower thoracic guided SB reach like prayer with SB x 2 rounds each, standing thoracic sideglides with contralateral rotation bil x 3 rounds each way    Other Lumbar Stretch Exercise adductor Rt/Lt 1x30 each, then butterfly stretch x 60 sec      Manual Therapy   Manual Therapy Myofascial release    Soft tissue mobilization through clothing in bil SL: adductors, strumming layer 1 PF, bil coccygeus and levator ani deep pressure release    Myofascial Release with movement: thoracodorsal fascia elongation with standing thoracic prayer with SB bil                    PT Short Term Goals - 12/18/19 1101      PT SHORT TERM GOAL #1   Title Pt will be ind with intial HEP for PF stretching, self-massage and release  techniques through positional breathing and visualization.    Status Achieved      PT SHORT TERM GOAL #2   Title Pt will achieve Rt hip ROM WNL symmetrical to Lt hip    Status Achieved      PT SHORT TERM GOAL #3   Title Pt will demo proper toileting posture, breathwork and bulge ability for improved defecation.    Status Achieved             PT Long Term Goals - 12/18/19 1101      PT LONG TERM GOAL #1   Title Pt will be ind with self-care techniques and HEP for optimal PF and trunk mobility and function    Status On-going      PT LONG TERM GOAL #2   Title Pt will report improved ease with defecation at least 3 times a week, taking < 1 hour for evacuation of bowels.    Status On-going      PT LONG TERM GOAL #3   Title Pt will be able to tolerate use of pelvic wand or up to size 4 dilator to improve tolerance of speculum exam or penetrative intercourse.    Baseline varies in  pain/ability to relax, no pentrative intercourse due to pain    Status On-going      PT LONG TERM GOAL #4   Title Pt will report improved low back and pelvic pain by at least 50% with ADLs and toileting.    Status On-going      PT LONG TERM GOAL #5   Title Pt will be able to demo improved excursion of PF with contract and bulge for continence and void/defecation success.    Baseline Pt is able to bulge well    Status Achieved                 Plan - 01/15/20 1202    Clinical Impression Statement Pt arrives with most optimistic report since starting PT, stating she is making progress on slow re-introduction of physical activities without "wrecking her back."  She also has reduced how much she is paying attention to constant self-assessment of alignment of her back and pelvis.  She was able to tolerate for the first time since evaluation some release techniques through clothing for adductors and PF muscles bil.  She has tension bil in coccygues and levator ani which released well with focused stretching, breathing and manual techniques.  Continue along POC as her nervous system allows for.    Comorbidities anxiety, depression, movement disorder    PT Frequency 1x / week    PT Duration 12 weeks    PT Treatment/Interventions ADLs/Self Care Home Management;Biofeedback;Electrical Stimulation;Cryotherapy;Moist Heat;Traction;Neuromuscular re-education;Therapeutic exercise;Therapeutic activities;Functional mobility training;Patient/family education;Manual techniques;Dry needling;Joint Manipulations;Spinal Manipulations;Passive range of motion    PT Next Visit Plan continue manual techniques, pelvic floor treatment if ready, stress/anxiety management strategies    PT Home Exercise Plan [phone]           Patient will benefit from skilled therapeutic intervention in order to improve the following deficits and impairments:     Visit Diagnosis: Other lack of coordination  Chronic midline low  back pain without sciatica  Cramp and spasm     Problem List Patient Active Problem List   Diagnosis Date Noted   Positive ANA (antinuclear antibody) 10/19/2019   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 [place], [place], [address] Phone: [phone]   Fax:  [phone]  Name: Ameli Sangiovanni MRN: [id number] Date of Birth: 04-22-1990

## 2020-01-22 ENCOUNTER — Ambulatory Visit: Payer: 59 | Admitting: Physical Therapy

## 2020-01-22 ENCOUNTER — Other Ambulatory Visit: Payer: Self-pay

## 2020-01-22 ENCOUNTER — Encounter: Payer: Self-pay | Admitting: Physical Therapy

## 2020-01-22 DIAGNOSIS — R252 Cramp and spasm: Secondary | ICD-10-CM

## 2020-01-22 DIAGNOSIS — R278 Other lack of coordination: Secondary | ICD-10-CM

## 2020-01-22 DIAGNOSIS — M545 Low back pain, unspecified: Secondary | ICD-10-CM

## 2020-01-22 DIAGNOSIS — G8929 Other chronic pain: Secondary | ICD-10-CM

## 2020-01-22 NOTE — Patient Instructions (Signed)
Seated thoracic ROM: sideglide ribcage Lt and Rt x 10 reps total Add opposite rotation to sideglide (sideglide Rt, Rotate Lt, then repeat going other way) x 10 reps total Thoracic rotation stretch 3x10 sec holds to each side (focus on Rt rotation if more stiff)  On all 4s: Cat/Cow x 10 focusing on sagging spine between shoulder blades vs low back Walk hands side to side for all 4s sidebending 3x10 sec holds Thread needle 3x10 sec to each side  On sides:  Open books with deep slow breath (focus on Rt rotation if more stiff)

## 2020-01-22 NOTE — Therapy (Signed)
[facility] [address], [address] [place], [place], [address] Phone: [phone]   Fax:  [phone]  Physical Therapy Treatment  Patient Details  Name: Brittany [name] MRN: [id number] Date of Birth: 09-01-1990 Referring Provider (PT): Orland Mustard, MD   Encounter Date: 01/22/2020   PT End of Session - 01/22/20 1140    Visit Number 9    Date for PT Re-Evaluation 02/05/20    Authorization Type [employer]    Authorization - Number of Visits 60    PT Start Time 1145    PT Stop Time 1230    PT Time Calculation (min) 45 min    Activity Tolerance Patient tolerated treatment well    Behavior During Therapy [facility] for tasks assessed/performed;Agitated;Anxious           Past Medical History:  Diagnosis Date  . Ankle injury   . Anxiety   . Eating disorder   . Frequent headaches     Past Surgical History:  Procedure Laterality Date  . ANKLE SURGERY  2018  . HERNIA REPAIR    . WISDOM TOOTH EXTRACTION      There were no vitals filed for this visit.   Subjective Assessment - 01/22/20 1140    Subjective I'm ok.  Some parts are better some are still bad.  I am managing my back twist with more ease.  Bloating hasn't been as bad.  I continue to have mixed consistency of stool - liquid and pellets, never full relief.  I feel tight in my ribcage and pelvic floor overall.  I may need to stop trying to run b/c it is making me tighter.    Pertinent History Anorectal manometry: PF dysenergia, IBS, + ANA, Benign Hypermobility Syndrome, Functional Movement Disorder, Hx of anorexia, depression, Rt ankle injury    Patient Stated Goals improve defecation and time/place to defecation, understand rectocele and how to address, be able to be more intimate with my husband (insertion)    Currently in Pain? Yes    Pain Score --   Pt unable to give a number   Pain Type Chronic pain    Pain Onset More than a month ago                              [facility] Adult PT Treatment/Exercise - 01/22/20 0001      Exercises   Exercises Lumbar      Lumbar Exercises: Stretches   Other Lumbar Stretch Exercise seated thoracic sideglides x 10, then sideglides with opp rotation x 10, seated thoracic rotation 3x10 sec      Lumbar Exercises: Quadruped   Madcat/Old Horse 5 reps    Madcat/Old Horse Limitations sag thoracic for extension focus in upper and mid-back    Other Quadruped Lumbar Exercises thread the needle 1x15 sec    Other Quadruped Lumbar Exercises thoracic rotation hand on back of head x 5 each way      Manual Therapy   Manual Therapy Joint mobilization;Myofascial release    Joint Mobilization costovertebral and costotransverse joint mobs Rt T4-T6 Gr II/III for improved ring mobility into Lt sideglide    Myofascial Release Rt obliques and intercostals T4-T8                  PT Education - 01/22/20 1229    Education Details thoracic ROM in variety of positions    Person(s) Educated Patient  Methods Explanation;Handout;Demonstration;Verbal cues    Comprehension Verbalized understanding;Returned demonstration            PT Short Term Goals - 12/18/19 1101      PT SHORT TERM GOAL #1   Title Pt will be ind with intial HEP for PF stretching, self-massage and release techniques through positional breathing and visualization.    Status Achieved      PT SHORT TERM GOAL #2   Title Pt will achieve Rt hip ROM WNL symmetrical to Lt hip    Status Achieved      PT SHORT TERM GOAL #3   Title Pt will demo proper toileting posture, breathwork and bulge ability for improved defecation.    Status Achieved             PT Long Term Goals - 12/18/19 1101      PT LONG TERM GOAL #1   Title Pt will be ind with self-care techniques and HEP for optimal PF and trunk mobility and function    Status On-going      PT LONG TERM GOAL #2   Title Pt will report improved ease with defecation at  least 3 times a week, taking < 1 hour for evacuation of bowels.    Status On-going      PT LONG TERM GOAL #3   Title Pt will be able to tolerate use of pelvic wand or up to size 4 dilator to improve tolerance of speculum exam or penetrative intercourse.    Baseline varies in pain/ability to relax, no pentrative intercourse due to pain    Status On-going      PT LONG TERM GOAL #4   Title Pt will report improved low back and pelvic pain by at least 50% with ADLs and toileting.    Status On-going      PT LONG TERM GOAL #5   Title Pt will be able to demo improved excursion of PF with contract and bulge for continence and void/defecation success.    Baseline Pt is able to bulge well    Status Achieved                 Plan - 01/22/20 1325    Clinical Impression Statement Pt with mixed success since last visit.  Has felt varying levels of pain and tightness in back, gut and pelvic floor.  She says "she has been much worse so this isn't awful."  She declined the idea of PF treatment today due to overactive gut/bowels.  Session focused on thoracic torsion and mobility with manual techniques, stretching, and active motion.  PT gave Pt a checklist routine streamline for thoracic mobility to work on in varying positions as she felt improved following the session.  Pt with slow progress toward goals due to complex medical history and ongoing impact of heightened anxiety and mental health impacts on her physical presentation and experience of pain.    Comorbidities anxiety, depression, movement disorder    PT Frequency 1x / week    PT Duration 12 weeks    PT Treatment/Interventions ADLs/Self Care Home Management;Biofeedback;Electrical Stimulation;Cryotherapy;Moist Heat;Traction;Neuromuscular re-education;Therapeutic exercise;Therapeutic activities;Functional mobility training;Patient/family education;Manual techniques;Dry needling;Joint Manipulations;Spinal Manipulations;Passive range of motion     PT Next Visit Plan f/u on thoracic mobility checklist from last visit, PF treatment if ready, manual techniques to thoracic and lumbar spine, bil adductor STM and Rt hip mobility    PT Home Exercise Plan [email or web address]    Consulted and Agree with Plan of Care  Patient           Patient will benefit from skilled therapeutic intervention in order to improve the following deficits and impairments:     Visit Diagnosis: Other lack of coordination  Chronic midline low back pain without sciatica  Cramp and spasm     Problem List Patient Active Problem List   Diagnosis Date Noted  . Positive ANA (antinuclear antibody) 10/19/2019  . 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 Anorexia nervosa, restricting type, in full remission, moderate 02/16/2018  . History of depression 02/16/2018  . GAD (generalized anxiety disorder) 12/16/2017  . Pelvic floor dysfunction 12/16/2017  . Right hip pain 11/02/2017  . Grade 2 ankle sprain, right, initial encounter 05/16/2016  . 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 Right ankle pain 03/03/2015  . IBS (irritable bowel syndrome) 05/25/2009    Morton Cerami, PT 01/22/20 1:30 PM   [facility] [address], [address] [place], [place], [address] Phone: [phone]   Fax:  [phone]  Name: Brittany [name] MRN: [id number] Date of Birth: 1990-10-01

## 2020-01-23 ENCOUNTER — Encounter: Payer: Self-pay | Admitting: Family Medicine

## 2020-01-29 ENCOUNTER — Other Ambulatory Visit: Payer: Self-pay

## 2020-01-29 ENCOUNTER — Ambulatory Visit: Payer: 59 | Admitting: Physical Therapy

## 2020-01-29 ENCOUNTER — Encounter: Payer: Self-pay | Admitting: Physical Therapy

## 2020-01-29 DIAGNOSIS — M545 Low back pain, unspecified: Secondary | ICD-10-CM

## 2020-01-29 DIAGNOSIS — R278 Other lack of coordination: Secondary | ICD-10-CM | POA: Diagnosis not present

## 2020-01-29 DIAGNOSIS — R252 Cramp and spasm: Secondary | ICD-10-CM

## 2020-01-29 DIAGNOSIS — G8929 Other chronic pain: Secondary | ICD-10-CM

## 2020-01-29 NOTE — Patient Instructions (Signed)
Access Code: [email or web address] URL: [email or web address] Date: 01/29/2020 Prepared by: Morton Hewitt  Exercises Figure 4 Bridge - 1 x daily - 7 x weekly - 2 sets - 5 reps Sidelying Reverse Clamshell - 1 x daily - 7 x weekly - 2 sets - 5 reps Sidelying Thoracic Rotation with Open Book - 1 x daily - 7 x weekly - 1 sets - 10 reps Supine Bilateral Hip Internal Rotation Stretch - 1 x daily - 7 x weekly - 1 sets - 2 reps - 30 hold Supine Figure 4 Piriformis Stretch - 1 x daily - 7 x weekly - 1 sets - 2 reps - 30 hold Supine Butterfly Groin Stretch - 1 x daily - 7 x weekly - 1 sets - 2 reps - 30 hold Standing Hip Flexor Stretch - 1 x daily - 7 x weekly - 1 sets - 2 reps - 30 hold

## 2020-01-29 NOTE — Therapy (Signed)
[facility] [address] [place], [place], [address] Phone: [phone]   Fax:  [phone]  Physical Therapy Treatment  Patient Details  Name: Brittany [name] MRN: [id number] Date of Birth: 03-01-1990 Referring Provider (PT): Orland Mustard, MD   Encounter Date: 01/29/2020   PT End of Session - 01/29/20 1150    Visit Number 10    Date for PT Re-Evaluation 02/05/20    Authorization Type [employer]    Authorization - Number of Visits 60    PT Start Time 1150   Pt late   PT Stop Time 1230    PT Time Calculation (min) 40 min    Activity Tolerance Patient tolerated treatment well    Behavior During Therapy [facility] for tasks assessed/performed;Agitated;Anxious           Past Medical History:  Diagnosis Date  . Ankle injury   . Anxiety   . Eating disorder   . Frequent headaches     Past Surgical History:  Procedure Laterality Date  . ANKLE SURGERY  2018  . HERNIA REPAIR    . WISDOM TOOTH EXTRACTION      There were no vitals filed for this visit.   Subjective Assessment - 01/29/20 1151    Subjective I think the thoracic ROM routine you gave me last time has allowed for me to be a lot straighter than I used to be.  I am seeing MD to see about a return to work and to discuss my ongoing tightness.  I didn't have time to do my routine this morning for my gut so it's not great but better than it could be.  I need more strategies to help address pelvic twisting.    Pertinent History Anorectal manometry: PF dysenergia, IBS, + ANA, Benign Hypermobility Syndrome, Functional Movement Disorder, Hx of anorexia, depression, Rt ankle injury    Patient Stated Goals improve defecation and time/place to defecation, understand rectocele and how to address, be able to be more intimate with my husband (insertion)                             [facility] Adult PT Treatment/Exercise - 01/29/20 0001       Knee/Hip Exercises: Stretches   Hip Flexor Stretch Right;30 seconds    Hip Flexor Stretch Limitations standing    Piriformis Stretch Limitations fig 4 2x30 bil    Other Knee/Hip Stretches hooklying hip IR stretch using contralateral leg cross for overpressure 2x30 sec    Other Knee/Hip Stretches butterfly stretch bil x 30 sec      Manual Therapy   Manual Therapy Joint mobilization;Soft tissue mobilization    Joint Mobilization sacral rocking on Rt Gr II/III    Soft tissue mobilization deep hip rotators, glut min/med, piriformis                    PT Short Term Goals - 12/18/19 1101      PT SHORT TERM GOAL #1   Title Pt will be ind with intial HEP for PF stretching, self-massage and release techniques through positional breathing and visualization.    Status Achieved      PT SHORT TERM GOAL #2   Title Pt will achieve Rt hip ROM WNL symmetrical to Lt hip    Status Achieved      PT SHORT TERM GOAL #3   Title Pt will demo proper toileting posture, breathwork  and bulge ability for improved defecation.    Status Achieved             PT Long Term Goals - 12/18/19 1101      PT LONG TERM GOAL #1   Title Pt will be ind with self-care techniques and HEP for optimal PF and trunk mobility and function    Status On-going      PT LONG TERM GOAL #2   Title Pt will report improved ease with defecation at least 3 times a week, taking < 1 hour for evacuation of bowels.    Status On-going      PT LONG TERM GOAL #3   Title Pt will be able to tolerate use of pelvic wand or up to size 4 dilator to improve tolerance of speculum exam or penetrative intercourse.    Baseline varies in pain/ability to relax, no pentrative intercourse due to pain    Status On-going      PT LONG TERM GOAL #4   Title Pt will report improved low back and pelvic pain by at least 50% with ADLs and toileting.    Status On-going      PT LONG TERM GOAL #5   Title Pt will be able to demo improved excursion  of PF with contract and bulge for continence and void/defecation success.    Baseline Pt is able to bulge well    Status Achieved                 Plan - 01/29/20 1329    Clinical Impression Statement Pt arrived with much improved symmetry in alignment, tone and mobility of thoracic spine having been compliant with HEP routine for this body region from last visit.  She reported her spine has never been this straight.  She wanted more techniques to manage torsions in hips and pelvis today.  She really liked the hip IR stretch after PT performed mobs and STM to Rt hip.  PT reviewed contract/relax of glut gripping in standing to demo femoral head centering with good release of buttocks.  Pt with one more visit with anticipated d/c to continue working with tools through HEP to manage alignment, mobility and tone.  She has not been able to tolerate or consent to pelvic floor work and has benefitted greatly from working from the outside in with use of her PF release techniques previously learned in PT at home.    PT Frequency 1x / week    PT Duration 12 weeks    PT Treatment/Interventions ADLs/Self Care Home Management;Biofeedback;Electrical Stimulation;Cryotherapy;Moist Heat;Traction;Neuromuscular re-education;Therapeutic exercise;Therapeutic activities;Functional mobility training;Patient/family education;Manual techniques;Dry needling;Joint Manipulations;Spinal Manipulations;Passive range of motion    PT Next Visit Plan ERO and d/c to HEP    PT Home Exercise Plan [id number]    Consulted and Agree with Plan of Care Patient           Patient will benefit from skilled therapeutic intervention in order to improve the following deficits and impairments:     Visit Diagnosis: Other lack of coordination  Chronic midline low back pain without sciatica  Cramp and spasm     Problem List Patient Active Problem List   Diagnosis Date Noted  . Positive ANA (antinuclear antibody) 10/19/2019  .  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 Anorexia nervosa, restricting type, in full remission, moderate 02/16/2018  . History of depression 02/16/2018  . GAD (generalized anxiety disorder) 12/16/2017  . Pelvic floor dysfunction 12/16/2017  . Right hip pain 11/02/2017  .  Grade 2 ankle sprain, right, initial encounter 05/16/2016  . 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 Right ankle pain 03/03/2015  . IBS (irritable bowel syndrome) 05/25/2009    Brittany [name] E Brittany [name] 01/29/2020, 1:35 PM  [facility] [address] [place], [place], [address] Phone: [phone]   Fax:  [phone]  Name: Brittany [name] MRN: [id number] Date of Birth: 1990/06/05

## 2020-01-30 ENCOUNTER — Ambulatory Visit: Payer: 59 | Admitting: Family Medicine

## 2020-02-04 ENCOUNTER — Ambulatory Visit: Payer: 59 | Admitting: Family Medicine

## 2020-02-04 ENCOUNTER — Encounter: Payer: Self-pay | Admitting: Family Medicine

## 2020-02-04 ENCOUNTER — Other Ambulatory Visit: Payer: Self-pay

## 2020-02-04 VITALS — BP 98/62 | HR 88 | Temp 98.7°F | Ht 65.0 in | Wt 116.6 lb

## 2020-02-04 DIAGNOSIS — G259 Extrapyramidal and movement disorder, unspecified: Secondary | ICD-10-CM

## 2020-02-04 NOTE — Patient Instructions (Signed)
Physical medicine and rehab may be a good option for an opinion on getting better..... Looks this up and I can refer you if you'd like.   aw

## 2020-02-04 NOTE — Progress Notes (Signed)
Patient: Brittany [name] MRN: [id number] DOB: 07-Nov-1990 PCP: Orland Mustard, MD     Subjective:  Chief Complaint  Patient presents with  . Letter for School/Work    Pt would like to return to work. She has been out since Aug/2021.    HPI: The patient is a 30 y.o. female who presents today for work note. She has been out of work since Aug 2021 due to spasms. She is doing much better from the spasm standpoint. She is able to drive now and is ready to return to work. 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 The spasms have seemed to resolved and are very minor now. She has no problems driving and feels like she can drive a motorboat with no issues.   She has continued to do therapy for her pelvic floor issues. She has continued to see psychiatry. She is looking for counseling for her OCD/anxiety.     Review of Systems  Constitutional: Negative for fatigue and fever.  Respiratory: Negative for cough and shortness of breath.   Cardiovascular: Negative for chest pain, palpitations and leg swelling.  Gastrointestinal: Positive for abdominal pain. Negative for nausea and vomiting.  Musculoskeletal: Positive for myalgias.    Allergies Patient has No Known Allergies.  Past Medical History Patient  has a past medical history of Ankle injury, Anxiety, Eating disorder, and Frequent headaches.  Surgical History Patient  has a past surgical history that includes Wisdom tooth extraction; Ankle surgery (2018); and Hernia repair.  Family History Pateint's family history includes Alcohol abuse in her maternal grandfather; Anxiety disorder in her brother and mother; Dementia in her paternal grandfather; Depression in her brother, maternal grandfather, and mother; Diabetes in her maternal grandfather and maternal grandmother; Hearing loss in her maternal grandmother; Thyroid disease in her brother.  Social History Patient  reports that she has never smoked. She  has never used smokeless tobacco. She reports that she does not drink alcohol and does not use drugs.    Objective: Vitals:   02/04/20 1046  BP: 98/62  Pulse: 88  Temp: 98.7 F (37.1 C)  TempSrc: Temporal  SpO2: 100%  Weight: 116 lb 9.6 oz (52.9 kg)  Height: 5\' 5"  (1.651 m)    Body mass index is 19.4 kg/m.  Physical Exam Vitals reviewed.  Constitutional:      Appearance: Normal appearance. She is normal weight.  HENT:     Head: Normocephalic and atraumatic.  Cardiovascular:     Rate and Rhythm: Normal rate and regular rhythm.     Heart sounds: Normal heart sounds.  Pulmonary:     Effort: Pulmonary effort is normal.     Breath sounds: Normal breath sounds.  Abdominal:     General: Abdomen is flat. Bowel sounds are normal.     Palpations: Abdomen is soft.  Neurological:     General: No focal deficit present.     Mental Status: She is alert and oriented to person, place, and time.  Psychiatric:        Mood and Affect: Mood normal.        Behavior: Behavior normal.        Assessment/plan: 1. Functional movement disorder Is doing much much better and can physically drive and do things again. I'll release her to go to work. Note given.   For her tight pelvic floor discussed possibility of seeing a PMR doc. She will look into this and let me  know.   This visit occurred during the SARS-CoV-2 public health emergency.  Safety protocols were in place, including screening questions prior to the visit, additional usage of staff PPE, and extensive cleaning of exam room while observing appropriate contact time as indicated for disinfecting solutions.     Return if symptoms worsen or fail to improve.   Orland Mustard, MD [facility]   02/04/2020

## 2020-02-05 ENCOUNTER — Ambulatory Visit: Payer: 59 | Attending: Family Medicine | Admitting: Physical Therapy

## 2020-02-05 ENCOUNTER — Encounter: Payer: Self-pay | Admitting: Physical Therapy

## 2020-02-05 DIAGNOSIS — G8929 Other chronic pain: Secondary | ICD-10-CM | POA: Diagnosis present

## 2020-02-05 DIAGNOSIS — R278 Other lack of coordination: Secondary | ICD-10-CM | POA: Diagnosis present

## 2020-02-05 DIAGNOSIS — R252 Cramp and spasm: Secondary | ICD-10-CM | POA: Diagnosis present

## 2020-02-05 DIAGNOSIS — M545 Low back pain, unspecified: Secondary | ICD-10-CM | POA: Insufficient documentation

## 2020-02-05 NOTE — Therapy (Signed)
[facility] [address] [place], [place], [address] Phone: [phone]   Fax:  [phone]  Physical Therapy Treatment  Patient Details  Name: Brittany [name] MRN: [id number] Date of Birth: [birth year] Referring Provider (PT): Orma Flaming, MD   Encounter Date: 02/05/2020   PT End of Session - 02/05/20 1153    Visit Number 11    Date for PT Re-Evaluation 02/05/20    Authorization Type [employer]    Authorization - Visit Number 8    Authorization - Number of Visits 60    PT Start Time 2446   Pt late   PT Stop Time 1233    PT Time Calculation (min) 38 min    Activity Tolerance Patient tolerated treatment well    Behavior During Therapy [facility] for tasks assessed/performed;Agitated;Anxious           Past Medical History:  Diagnosis Date  . Ankle injury   . Anxiety   . Eating disorder   . Frequent headaches     Past Surgical History:  Procedure Laterality Date  . ANKLE SURGERY  2018  . HERNIA REPAIR    . WISDOM TOOTH EXTRACTION      There were no vitals filed for this visit.   Subjective Assessment - 02/05/20 1154    Subjective I saw my MD yesterday and got a return to work note.  I worry about not having enough time to do all my therapy routines at home when I start working again.  If I don't do all my therapy at home each day I get into more pain.  My tightness builds up if I don't do all of my routines.  Then my gut gets bad.    Pertinent History Anorectal manometry: PF dysenergia, IBS, + ANA, Benign Hypermobility Syndrome, Functional Movement Disorder, Hx of anorexia, depression, Rt ankle injury    Patient Stated Goals improve defecation and time/place to defecation, understand rectocele and how to address, be able to be more intimate with my husband (insertion)              [facility] PT Assessment - 02/05/20 0001      Assessment   Medical Diagnosis M62.89 (ICD-10-CM) - Pelvic floor  dysfunction    Referring Provider (PT) Orma Flaming, MD    Prior Therapy pelvic PT in past                         [facility] Adult PT Treatment/Exercise - 02/05/20 0001      Lumbar Exercises: Stretches   Hip Flexor Stretch Left;Right;1 rep;30 seconds    Hip Flexor Stretch Limitations add arms overhead with SB, signif stretch on Rt compared to [employer] Right;1 rep;30 seconds    Quad Stretch Limitations prone    Figure 4 Stretch 1 rep;30 seconds;With overpressure;Supine    Figure 4 Stretch Limitations bil    Other Lumbar Stretch Exercise thoracic sideglides x 6 reps, thoracic rotation, thoracic SB x 3 each way x 10 sec    Other Lumbar Stretch Exercise ITB stretch with strap bil x 30 sec      Manual Therapy   Joint Mobilization sacral rocking on Rt Gr II/III    Soft tissue mobilization prone STM Rt hip: deep hip rotators, coccygeus, piriformis                    PT Short Term Goals - 12/18/19 1101  PT SHORT TERM GOAL #1   Title Pt will be ind with intial HEP for PF stretching, self-massage and release techniques through positional breathing and visualization.    Status Achieved      PT SHORT TERM GOAL #2   Title Pt will achieve Rt hip ROM WNL symmetrical to Lt hip    Status Achieved      PT SHORT TERM GOAL #3   Title Pt will demo proper toileting posture, breathwork and bulge ability for improved defecation.    Status Achieved             PT Long Term Goals - 02/05/20 1154      PT LONG TERM GOAL #1   Title Pt will be ind with self-care techniques and HEP for optimal PF and trunk mobility and function    Status Achieved      PT LONG TERM GOAL #2   Title Pt will report improved ease with defecation at least 3 times a week, taking < 1 hour for evacuation of bowels.    Status Not Met      PT LONG TERM GOAL #3   Title Pt will be able to tolerate use of pelvic wand or up to size 4 dilator to improve tolerance of speculum exam or  penetrative intercourse.    Status Not Met      PT LONG TERM GOAL #4   Title Pt will report improved low back and pelvic pain by at least 50% with ADLs and toileting.    Status Partially Met      PT LONG TERM GOAL #5   Title Pt will be able to demo improved excursion of PF with contract and bulge for continence and void/defecation success.    Status Achieved                 Plan - 02/05/20 1235    Clinical Impression Statement Pt met all STGs and has learned stretches and ROM techniques for improved mobility, flexibility and alignment of spine and pelvis within this episode of PT.  Initial LTGs were focused on specific pelvic floor goals but Pt had ongoing gut upset and pain that prevented progress on direct internal pelvic floor work. She has been able to improve mobility and pain enough such that she obtained a return to work letter from her MD yesterday and will look to gain employment again which is a big positive.  Pt continues to use many tools from several practitioners she has worked with to help with pelvic floor release, toileting, and pain related to signif tension of soft tissues of pelvis.  She understands that her heightened nervous system state along with other mental health diagnoses including OCD likely contribute to her overall body tension and focus.  PT re-iterated that symmetry is not the goal but improved mobility and pain can be gained within asymmetry.  PT and Pt also discussed importance of seeking mental health provider to help manage other diagnoses that are likely feeding into her physical state.  Pt will be d/c'd at this time due to lack of progress with PF specific treatment and given Pt ind with HEP tools for trunk, pelvis and hips to target mobility and reduced pain for improved tolerance of daily activities.    Comorbidities anxiety, depression, movement disorder    PT Frequency 1x / week    PT Duration 12 weeks    PT Treatment/Interventions ADLs/Self Care  Home Management;Biofeedback;Electrical Stimulation;Cryotherapy;Moist Heat;Traction;Neuromuscular re-education;Therapeutic exercise;Therapeutic activities;Functional mobility  training;Patient/family education;Manual techniques;Dry needling;Joint Manipulations;Spinal Manipulations;Passive range of motion    PT Next Visit Plan d/c to HEP    PT Home Exercise Plan [id number]    Consulted and Agree with Plan of Care Patient           Patient will benefit from skilled therapeutic intervention in order to improve the following deficits and impairments:     Visit Diagnosis: Other lack of coordination  Chronic midline low back pain without sciatica  Cramp and spasm     Problem List Patient Active Problem List   Diagnosis Date Noted  . Positive ANA (antinuclear antibody) 10/19/2019  . 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 Anorexia nervosa, restricting type, in full remission, moderate 02/16/2018  . History of depression 02/16/2018  . GAD (generalized anxiety disorder) 12/16/2017  . Pelvic floor dysfunction 12/16/2017  . Right hip pain 11/02/2017  . Grade 2 ankle sprain, right, initial encounter 05/16/2016  . 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 Right ankle pain 03/03/2015  . IBS (irritable bowel syndrome) 05/25/2009    PHYSICAL THERAPY DISCHARGE SUMMARY  Visits from Start of Care: 11  Current functional level related to goals / functional outcomes: See above   Remaining deficits: See above   Education / Equipment: HEP Plan: Patient agrees to discharge.  Patient goals were partially met. Patient is being discharged due to lack of progress.  ?????         Baruch Merl, PT 02/05/20 1:23 PM   [facility] [address] [place], [place], [address] Phone: [phone]   Fax:  [phone]  Name: Numa Heatwole MRN: [id number] Date of Birth:  Nov 09, 1990

## 2020-02-14 ENCOUNTER — Encounter: Payer: Self-pay | Admitting: Family Medicine

## 2020-02-14 DIAGNOSIS — M6289 Other specified disorders of muscle: Secondary | ICD-10-CM

## 2020-02-14 DIAGNOSIS — G259 Extrapyramidal and movement disorder, unspecified: Secondary | ICD-10-CM

## 2020-02-20 ENCOUNTER — Telehealth: Payer: Self-pay | Admitting: Family Medicine

## 2020-02-20 DIAGNOSIS — M6289 Other specified disorders of muscle: Secondary | ICD-10-CM

## 2020-02-20 NOTE — Telephone Encounter (Signed)
I did a referral to physical medical and rehab PHYSICIAN. This is certain type of doctor and she was sent to a physical therapist. Please make sure we get referrals right and if any questions please ask. I have put in another referral for PMR.   Thanks! Dr. Artis Flock

## 2020-02-22 ENCOUNTER — Encounter: Payer: Self-pay | Admitting: Physical Medicine and Rehabilitation

## 2020-04-01 ENCOUNTER — Other Ambulatory Visit: Payer: Self-pay | Admitting: Obstetrics and Gynecology

## 2020-04-01 ENCOUNTER — Ambulatory Visit
Admission: RE | Admit: 2020-04-01 | Discharge: 2020-04-01 | Disposition: A | Discharge status: Home / Self Care | Payer: 59 | Source: Ambulatory Visit | Attending: Obstetrics and Gynecology | Admitting: Obstetrics and Gynecology

## 2020-04-01 ENCOUNTER — Other Ambulatory Visit: Payer: Self-pay

## 2020-04-01 DIAGNOSIS — N6320 Unspecified lump in the left breast, unspecified quadrant: Secondary | ICD-10-CM

## 2020-04-07 ENCOUNTER — Encounter: Payer: 59 | Attending: Physical Medicine and Rehabilitation | Admitting: Physical Medicine and Rehabilitation

## 2020-04-07 ENCOUNTER — Other Ambulatory Visit: Payer: Self-pay

## 2020-04-07 VITALS — BP 109/72 | HR 76 | Temp 98.5°F | Ht 64.5 in | Wt 117.4 lb

## 2020-04-07 DIAGNOSIS — F411 Generalized anxiety disorder: Secondary | ICD-10-CM | POA: Diagnosis present

## 2020-04-07 DIAGNOSIS — M357 Hypermobility syndrome: Secondary | ICD-10-CM | POA: Diagnosis present

## 2020-04-07 DIAGNOSIS — R0989 Other specified symptoms and signs involving the circulatory and respiratory systems: Secondary | ICD-10-CM | POA: Diagnosis present

## 2020-04-07 DIAGNOSIS — M6289 Other specified disorders of muscle: Secondary | ICD-10-CM

## 2020-04-07 MED ORDER — TIZANIDINE HCL 2 MG PO CAPS: 2.0000 mg | ORAL_CAPSULE | Freq: Every day | ORAL | 1 refills | Status: DC

## 2020-04-07 NOTE — Progress Notes (Signed)
Subjective:    Patient ID: Brittany [name], female    DOB: [birth year], 30 y.o.   MRN: [id number]  HPI  Brittany [name] is a 30 year old woman who presents to establish care for chronic pelvic pain.  1) Pelvic flood dysfunction: -She has done PT which she feels is a temporary band aid.  -Average pain is 7/10 -She feels that some, but not all of the pain is from anxiety. 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   2) Hypermobility: - she has chronic ankle injuries as a result -she fees that a lot of her tightness is related to her OCD  3) Throat tightness: -she loves to sing and this has inhibited her ability to do so.   Pain Inventory Average Pain 7 Pain Right Now 3 My pain is constant, sharp, dull and stabbing  In the last 24 hours, has pain interfered with the following? General activity 6 Relation with others 6 Enjoyment of life 9 What TIME of day is your pain at its worst? varies Sleep (in general) Fair  Pain is worse with: some activites Pain improves with: therapy/exercise Relief from Meds: 0  walk without assistance how many minutes can you walk? depends ability to climb steps?  yes do you drive?  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 Depression Mother   . Anxiety disorder Mother   . Depression Brother   . Anxiety disorder Brother   . Diabetes Maternal Grandmother   . Hearing loss Maternal Grandmother   . Alcohol abuse Maternal Grandfather   . Depression Maternal Grandfather   . Diabetes Maternal Grandfather   . Dementia Paternal Grandfather   . Thyroid disease Brother    Social History   Socioeconomic History  . Marital status: Married     Spouse name: Not on file  . Number of children: Not on file  . Years of education: Not on file  . Highest education level: Not on file  Occupational History  . Not on file  Tobacco Use  . Smoking status: Never Smoker  . Smokeless tobacco: Never Used  Vaping Use  . Vaping Use: Never used  Substance and Sexual Activity  . Alcohol use: No    Alcohol/week: 0.0 standard drinks  . Drug use: No  . Sexual activity: Yes    Birth control/protection: None  Other Topics Concern  . Not on file  Social History Narrative  . Not on file   Social Determinants of Health   Financial Resource Strain: Not on file  Food Insecurity: Not on file  Transportation Needs: Not on file  Physical Activity: Not on file  Stress: Not on file  Social Connections: Not on file   Past Surgical History:  Procedure Laterality Date  . ANKLE SURGERY  2018  . HERNIA REPAIR    . WISDOM TOOTH EXTRACTION     Past Medical History:  Diagnosis Date  . Ankle injury   . Anxiety   . Eating disorder   . Frequent headaches    BP 109/72   Pulse 76   Temp 98.5 F (36.9 C)   Ht 5' 4.5" (1.638 m)  Wt 117 lb 6.4 oz (53.3 kg)   SpO2 98%   BMI 19.84 kg/m   Opioid Risk Score:   Fall Risk Score:  `1  Depression screen PHQ 2/9  Depression screen [facility] 2/9 04/02/2019 02/09/2016 09/04/2015 07/24/2015  Decreased Interest 0 0 0 0  Down, Depressed, Hopeless 1 0 0 0  PHQ - 2 Score 1 0 0 0  Altered sleeping 0 - - -  Tired, decreased energy 1 - - -  Change in appetite 0 - - -  Feeling bad or failure about yourself  3 - - -  Trouble concentrating 2 - - -  Moving slowly or fidgety/restless 0 - - -  Suicidal thoughts 2 - - -  PHQ-9 Score 9 - - -  Difficult doing work/chores Somewhat difficult - - -   Review of Systems  Musculoskeletal: Positive for back pain and neck pain.       Stomach and pelvic area  All other systems reviewed and are negative.      Objective:   Physical Exam Gen: no distress, normal  appearing HEENT: oral mucosa pink and moist, NCAT Cardio: Reg rate Chest: normal effort, normal rate of breathing Abd: soft, non-distended Ext: no edema Psych: pleasant, normal affect Skin: intact Neuro: Alert and oriented x3 Musculoskeletal: diffuse tightness, active spasms during exam    Assessment & Plan:  Brittany [name] is a 30 year old woman who presents to establish care for pelvic floor dysfunction.  1) Pelvic floor dysfunction: -she takes magnesium citrate and even then she cannot have complete evacuation. -once in a while she has bowel incontinence -She has pelvic floor pain and has had great benefit from pelvic floor therapists.  -discussed pudendal nerve entrapment, prolotherapy. Provided link for further education.  -discussed TCA. -continue magnesium citrate.  -this has a huge impact on her quality of life.  -discussed anti-spasticity medications and Botox.   2) Anxiety: -continue Lexapro -she is seeing a mental health therapist.   3) Sweating profusely at times: -could be autonomic dysfunction.   60 minutes spent in discussion of pelvic floor dysfunction, anxiety, diffuse sweating, her therapy, her OCD, anxiety, pudendal nerve blocks, Botox injections, and prolotherapy, constipation.

## 2020-04-18 IMAGING — MR MR HIP*R* W/CM
4 of 5 series · 22 of 40 positions shown · IV contrast (agent unspecified)
Comparison: Injection images same date. Right hip radiographs
10/05/2017.

CLINICAL DATA: Avid runner with right hip pain and stiffness for
several months. No acute injury or prior relevant surgery.

EXAM:
MRI OF THE RIGHT HIP WITH CONTRAST (MR Arthrogram)
TECHNIQUE: Multiplanar, multisequence MR imaging of the hip was performed
immediately following contrast injection into the hip joint under
fluoroscopic guidance. No intravenous contrast was administered.

[Series 8: T2 fat-sat · coronal · right · 3.0mm · 0.78mm/px · 9 of 30 slices shown]
[im 1/30]
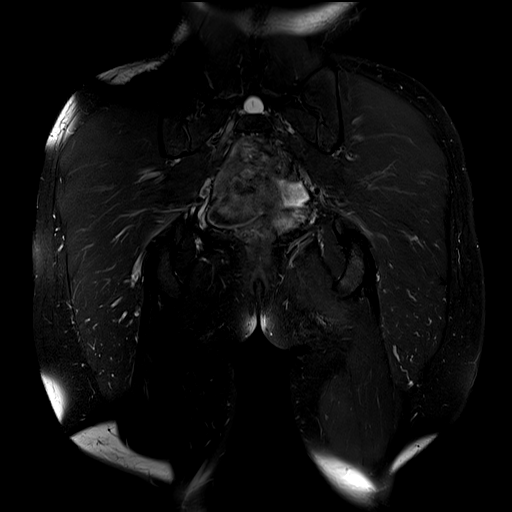
[im 4/30]
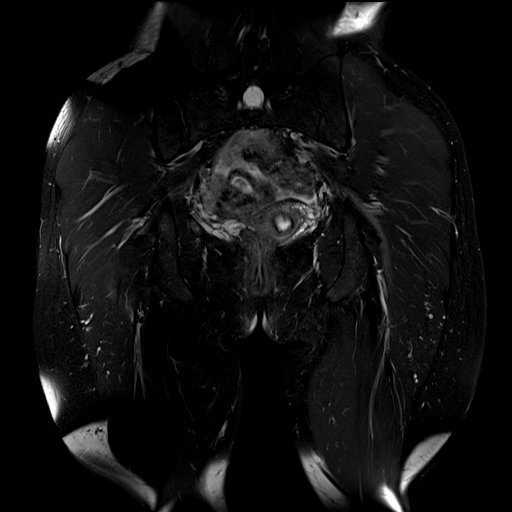
[im 8/30]
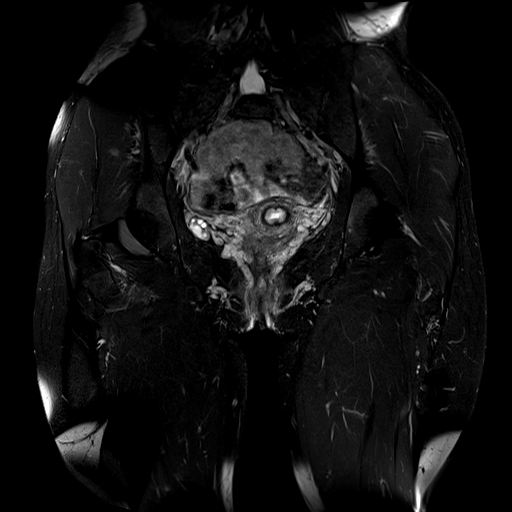
[im 11/30]
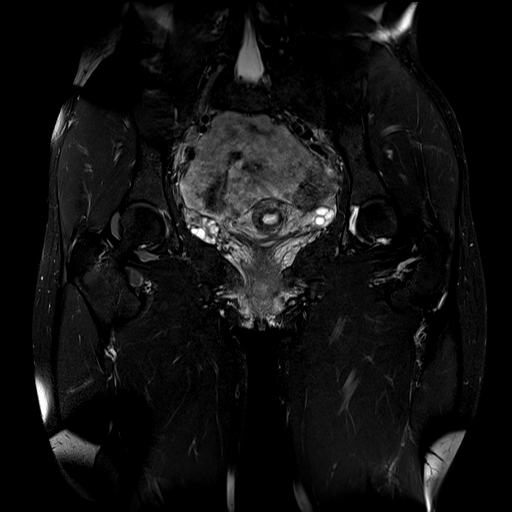
[im 15/30]
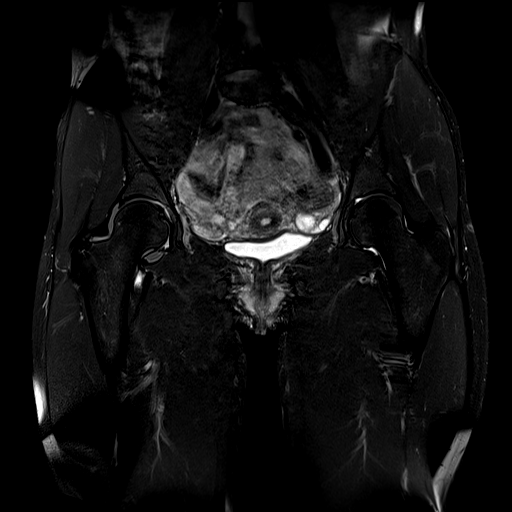
[im 19/30]
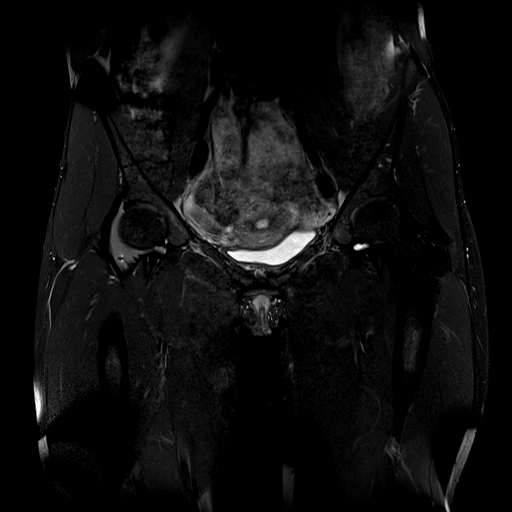
[im 22/30]
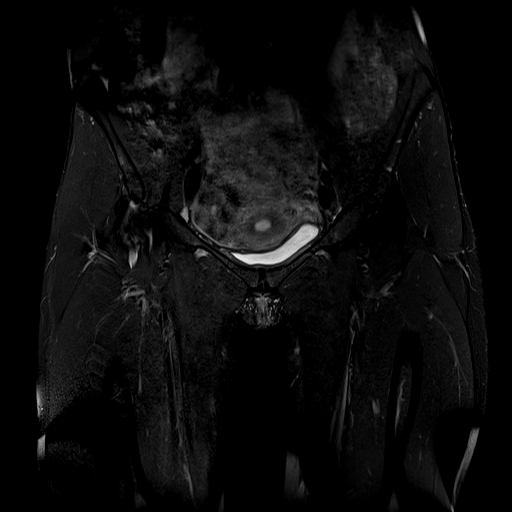
[im 26/30]
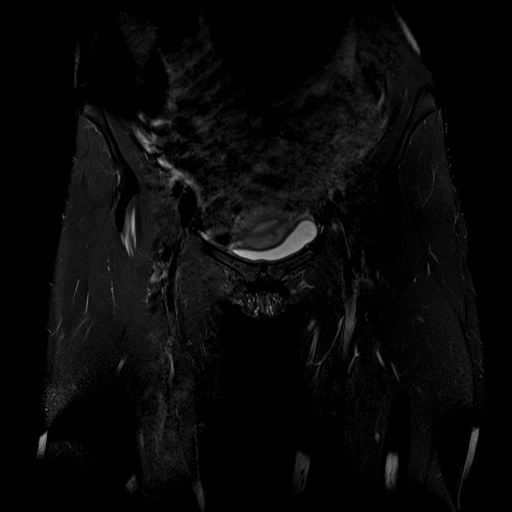
[im 30/30]
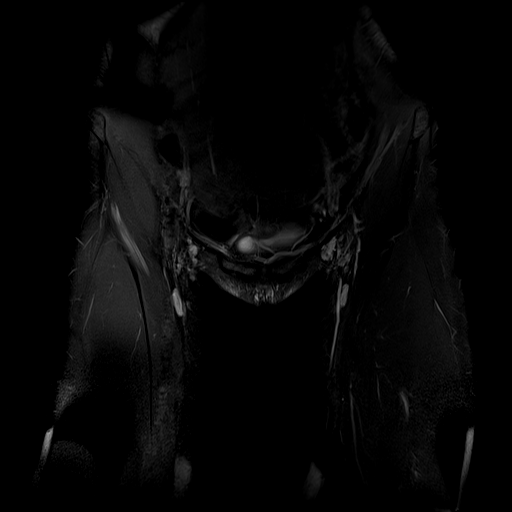

[Series 9: T1 · coronal · right · 3.0mm · 0.78mm/px · 7 of 30 slices shown]
[im 1/30]
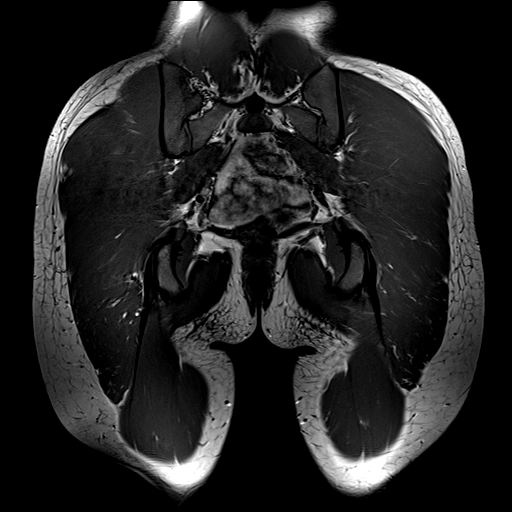
[im 5/30]
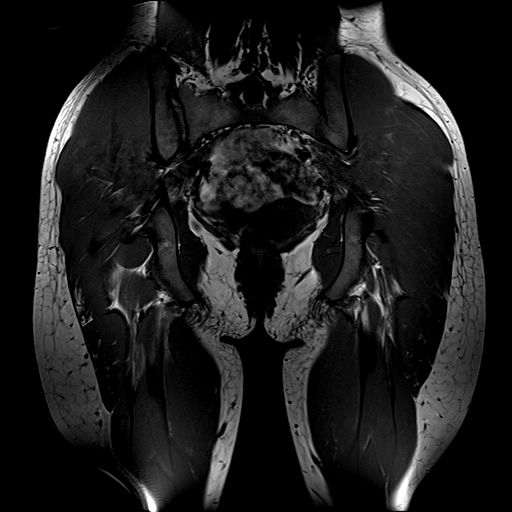
[im 9/30]
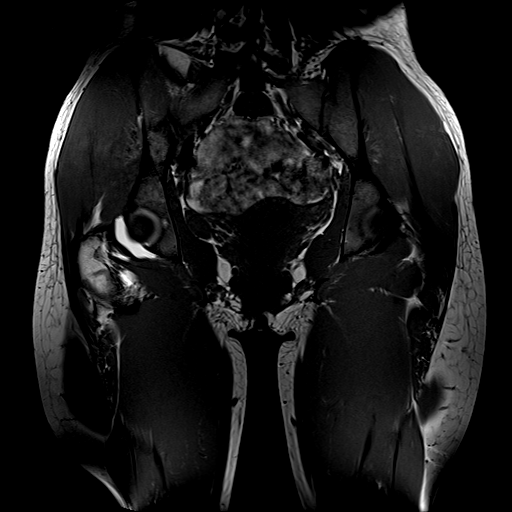
[im 13/30]
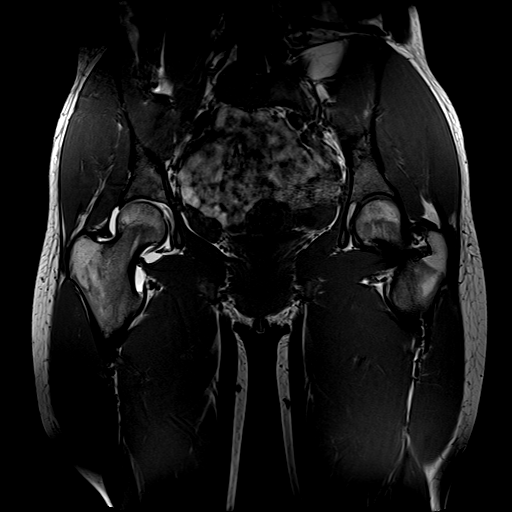
[im 17/30]
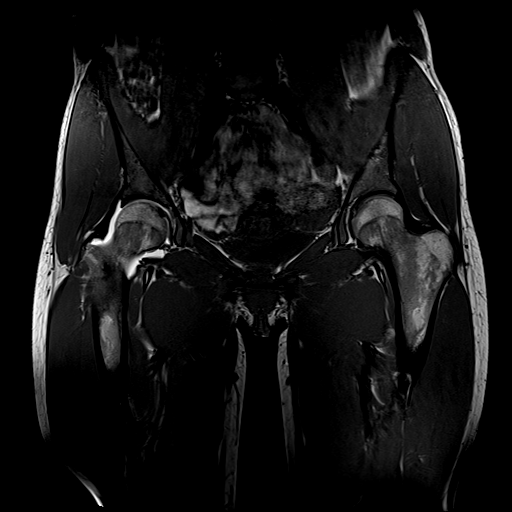
[im 21/30]
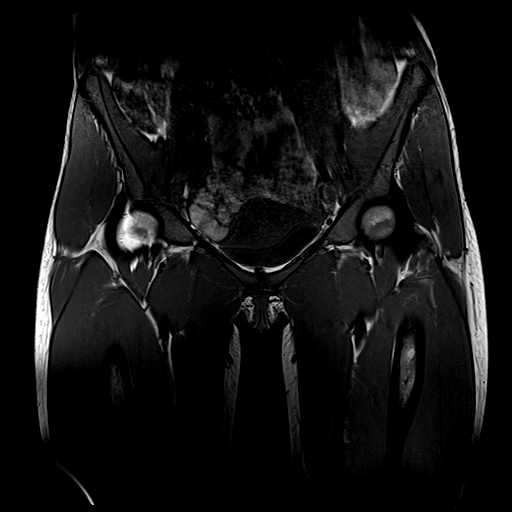
[im 25/30]
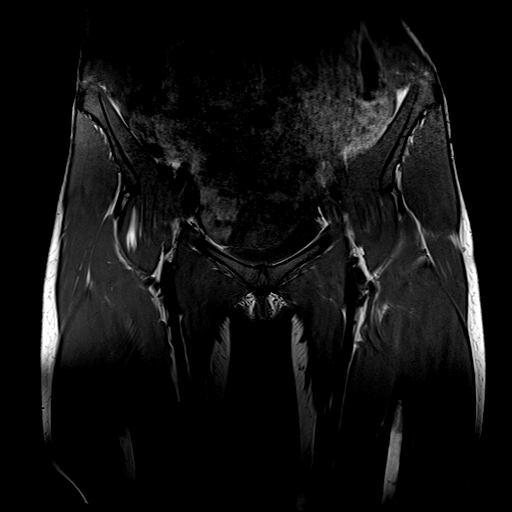

[Series 10: T1 fat-sat · axial · right · 3.0mm · 0.56mm/px · z∈[-69,-1]mm · 3 of 31 slices shown (1 of 2)]
[im 5/31]
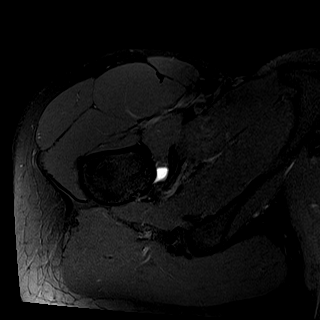
[im 18/31]
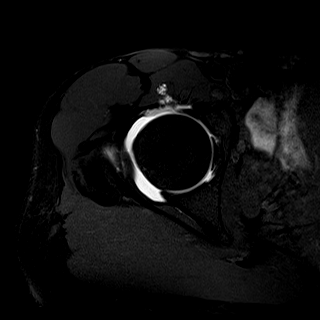
[im 26/31]
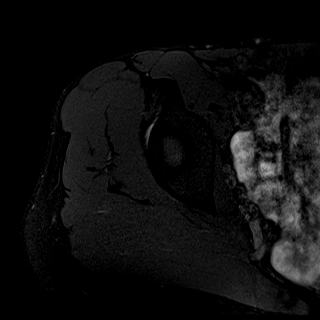

[Series 11: T1 fat-sat · coronal · right · 3.0mm · 0.56mm/px · 3 of 25 slices shown (2 of 2)]
[im 5/25]
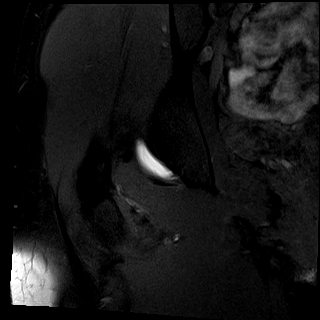
[im 13/25]
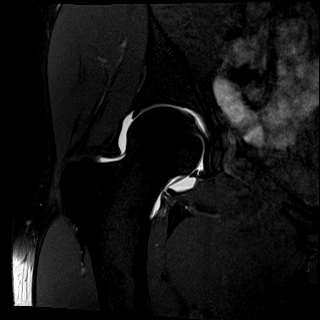
[im 21/25]
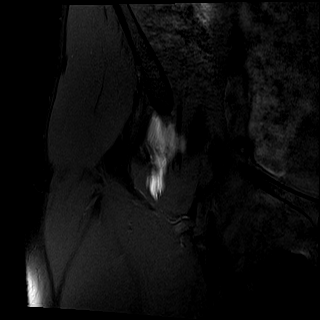

[22 of 40 positions shown; findings below may reference images not displayed]

FINDINGS: Bones: There is no evidence of acute fracture, dislocation or
avascular necrosis. The visualized bony pelvis appears normal. The
visualized sacroiliac joints and symphysis pubis appear normal.

Articular cartilage and labrum

Articular cartilage: No focal chondral defect or subchondral signal
abnormality identified.

Labrum: There is a possible small nondisplaced tear of the anterior
right labrum, best seen on sagittal images 20-22 of series 12. There
is no paralabral cyst.

Joint or bursal effusion

Joint effusion: The right hip joint is adequately distended with
contrast. No evidence of loose body. No significant left hip joint
effusion.

Bursae: No focal periarticular fluid collection.

Muscles and tendons

Muscles and tendons: The visualized gluteus, hamstring and iliopsoas
tendons appear normal. The piriformis muscles appear symmetric.
There is mild T2 hyperintensity and contrast in the right iliopsoas
muscle attributed to the injection.

Other findings

Miscellaneous: The visualized internal pelvic contents appear
unremarkable.
IMPRESSION: 1. Possible small nondisplaced tear of the right acetabular labrum
anteriorly.
2. No acute osseous findings or significant arthropathic changes.
3. The hip muscles and tendons appear normal. No evidence of
bursitis.

## 2020-05-09 ENCOUNTER — Encounter: Payer: Self-pay | Admitting: Family Medicine

## 2020-05-09 DIAGNOSIS — M6289 Other specified disorders of muscle: Secondary | ICD-10-CM

## 2020-05-21 ENCOUNTER — Other Ambulatory Visit: Payer: Self-pay

## 2020-05-21 ENCOUNTER — Encounter: Payer: 59 | Attending: Physical Medicine and Rehabilitation | Admitting: Physical Medicine and Rehabilitation

## 2020-05-21 ENCOUNTER — Encounter: Payer: Self-pay | Admitting: Physical Medicine and Rehabilitation

## 2020-05-21 VITALS — BP 114/71 | HR 80 | Temp 98.8°F | Ht 64.5 in | Wt 118.2 lb

## 2020-05-21 DIAGNOSIS — M791 Myalgia, unspecified site: Secondary | ICD-10-CM | POA: Insufficient documentation

## 2020-05-21 DIAGNOSIS — M6289 Other specified disorders of muscle: Secondary | ICD-10-CM | POA: Diagnosis present

## 2020-05-21 NOTE — Patient Instructions (Signed)
bacopa 500mg   N-acetyl cysteine 600mg  twice per day

## 2020-05-21 NOTE — Progress Notes (Signed)
Trigger Point Injection  Indication: Lumbosacral myofascial pain not relieved by medication management and other conservative care.  Informed consent was obtained after describing risk and benefits of the procedure with the patient, this includes bleeding, bruising, infection and medication side effects.  The patient wishes to proceed and has given written consent.  The patient was placed in a seated position.  The cervical area was marked and prepped with Betadine.  It was entered with a 25-gauge 1/2 inch needle and a total of 5 mL of 1% lidocaine and dextrose was injected into a total of 6 trigger points, after negative draw back for blood.  The patient tolerated the procedure well.  Post procedure instructions were given.

## 2020-05-26 ENCOUNTER — Encounter: Payer: Self-pay | Admitting: Family Medicine

## 2020-05-26 DIAGNOSIS — K623 Rectal prolapse: Secondary | ICD-10-CM

## 2020-06-20 ENCOUNTER — Other Ambulatory Visit: Payer: Self-pay

## 2020-06-20 ENCOUNTER — Telehealth: Payer: Self-pay

## 2020-06-20 ENCOUNTER — Encounter: Payer: 59 | Attending: Physical Medicine and Rehabilitation | Admitting: Physical Medicine and Rehabilitation

## 2020-06-20 ENCOUNTER — Encounter: Payer: Self-pay | Admitting: Physical Medicine and Rehabilitation

## 2020-06-20 VITALS — BP 108/71 | HR 71 | Temp 97.8°F | Ht 64.0 in | Wt 116.8 lb

## 2020-06-20 DIAGNOSIS — M791 Myalgia, unspecified site: Secondary | ICD-10-CM | POA: Diagnosis present

## 2020-06-20 NOTE — Telephone Encounter (Signed)
Patient states that she was referred to a [facility] Urogynoecologist.  States that this office is booked out right now due to the only provider being on maternity leave.   Is requesting referral to be sent to Dr. Dessa Phi w/ [facility].  Urogynecology.    Please follow up with patient in regard.

## 2020-06-20 NOTE — Progress Notes (Signed)
Trigger Point Injection  Indication: Myofascial pain not relieved by medication management and other conservative care.  Informed consent was obtained after describing risk and benefits of the procedure with the patient, this includes bleeding, bruising, infection and medication side effects.  The patient wishes to proceed and has given written consent.  The patient was placed in a seated position.  The thoracic, lumbar, and sacral areas were marked and prepped with Betadine.  It was entered with a 25-gauge 1/2 inch needle and a total of 5 mL of dextrose 25% was injected into a total of 10 trigger points, after negative draw back for blood.  The patient tolerated the procedure well.  Post procedure instructions were given.

## 2020-06-27 ENCOUNTER — Telehealth: Payer: Self-pay

## 2020-06-27 NOTE — Telephone Encounter (Signed)
error 

## 2020-06-27 NOTE — Telephone Encounter (Signed)
Pt is calling back about a referral being sent to an urogynecologist, Sung Amabile. She stated that it is an urgent request. She stated she needs to be seen as soon as possible due to her conditions and disabilities.

## 2020-06-30 ENCOUNTER — Telehealth: Payer: Self-pay

## 2020-06-30 DIAGNOSIS — M6289 Other specified disorders of muscle: Secondary | ICD-10-CM

## 2020-06-30 DIAGNOSIS — K623 Rectal prolapse: Secondary | ICD-10-CM

## 2020-06-30 NOTE — Addendum Note (Signed)
Addended by: Asencion Partridge on: 06/30/2020 04:06 PM   Modules accepted: Orders

## 2020-06-30 NOTE — Telephone Encounter (Signed)
Patient called today requesting the referral to Dr. Ashley Royalty with Urogynecology for rectal prolapse-patient is not able to get in to see the MD until October if we do not upgrade this referral to urgent

## 2020-07-11 ENCOUNTER — Ambulatory Visit: Payer: 59 | Admitting: Physical Medicine and Rehabilitation

## 2020-07-16 ENCOUNTER — Encounter: Payer: Self-pay | Admitting: Physical Therapy

## 2020-07-16 ENCOUNTER — Other Ambulatory Visit: Payer: Self-pay

## 2020-07-16 ENCOUNTER — Ambulatory Visit: Payer: 59 | Attending: Family Medicine | Admitting: Physical Therapy

## 2020-07-16 DIAGNOSIS — R278 Other lack of coordination: Secondary | ICD-10-CM | POA: Diagnosis present

## 2020-07-16 NOTE — Therapy (Signed)
[facility] [address], [address] [place], [place], [address] Phone: [phone]   Fax:  [phone]  Physical Therapy Evaluation  Patient Details  Name: Brittany [name] MRN: [id number] Date of Birth: [birth year] Referring Provider (PT): Orland Mustard, MD   Encounter Date: 07/16/2020   PT End of Session - 07/16/20 1412     Visit Number 1    Authorization Type UHC    PT Start Time 1412    PT Stop Time 1450    PT Time Calculation (min) 38 min    Activity Tolerance Patient tolerated treatment well    Behavior During Therapy [facility] for tasks assessed/performed             Past Medical History:  Diagnosis Date   Ankle injury    Anxiety    Eating disorder    Frequent headaches     Past Surgical History:  Procedure Laterality Date   ANKLE SURGERY  2018   HERNIA REPAIR     WISDOM TOOTH EXTRACTION      There were no vitals filed for this visit.    Subjective Assessment - 07/16/20 1417     Subjective Pt had ultrasound and saw that there was a rectocele.  Pt is awaiting appointment with a urogynecologist.  Pt states she is getting 10-50% of the stool out of the rectum at a time. At best, having a BM I am mentally relaxed, safe, and all the right positions, and no time restraint.  I am usually in the bathroom 20 min to an hour.  After I have a BM, I have to go back again anywhere from 30 sec to 20 minutes.  I am doing physical therapy and eating breakfast between BMs in the morning. That whole process takes 5 hours average.  Bad days are most days when I have 10/10 pain.  This has been going on since I was 18.  And severity got worse over the last 2-3 years.  Recently I have been able to slightly improve my mental health which was bad since a bunch of other stuff going on with my body that caused me to be unable to sing and run.    Patient Stated Goals feel like I can improve the muscle control like it was when I did PT  using the biofeedback    Currently in Pain? Yes    Pain Score 10-Worst pain ever   immiately feels pain on bad day; mid shift 4-5 hour on a good day and might be 4-6/10   Pain Location Abdomen    Pain Orientation Lower    Pain Descriptors / Indicators Sharp    Pain Onset More than a month ago    Pain Frequency Intermittent    Aggravating Factors  not having a BM    Effect of Pain on Daily Activities any activity outside of the house is the most effected because it is longer time    Multiple Pain Sites No                [facility] PT Assessment - 07/16/20 0001       Assessment   Medical Diagnosis M62.89 (ICD-10-CM) - Pelvic floor dysfunction    Referring Provider (PT) Orland Mustard, MD    Onset Date/Surgical Date --   about 11 years ago     Balance Screen   Has the patient fallen in the past 6 months No      Home Environment  Living Environment Private residence    Living Arrangements Spouse/significant other      Prior Function   Level of Independence Independent    Vocation Full time employment      Cognition   Overall Cognitive Status Within Functional Limits for tasks assessed      Functional Tests   Functional tests Squat;Single leg stance      Squat   Comments Lt knee valgus and increased pronation during squat      Single Leg Stance   Comments Rt side hip drop slightly      Posture/Postural Control   Posture/Postural Control No significant limitations      ROM / Strength   AROM / PROM / Strength PROM      PROM   Overall PROM Comments Rt hip IR/ER 50%; Lt hip ER 75%      Flexibility   Soft Tissue Assessment /Muscle Length yes    Hamstrings 80% bil      Palpation   Palpation comment pelvis assessed and no obliquities noted      Ambulation/Gait   Gait Pattern Within Functional Limits                        Objective measurements completed on examination: See above findings.     Pelvic Floor Special Questions - 07/16/20 0001      External Palpation palpation 3 quick flickhard time relaxing; 20 second hold    Exam Type Deferred   ran out of time                          PT Long Term Goals - 07/16/20 1614       PT LONG TERM GOAL #1   Title Pt will be ind with HEP to work on maintaining and regaining muscle coordination    Time 12    Period Weeks    Status New    Target Date 10/08/20      PT LONG TERM GOAL #2   Title Pt will understand how to use home biofeedback unit for working on muscle coordination    Time 12    Period Weeks    Status New    Target Date 10/08/20      PT LONG TERM GOAL #3   Title Pt will report that she feels like she has a noticeable amount of improved control of her pelvic floor muscles    Time 12    Period Weeks    Status New    Target Date 10/08/20      PT LONG TERM GOAL #4   Title ...      PT LONG TERM GOAL #5   Title .Marland KitchenMarland Kitchen                    Plan - 07/16/20 1631     Clinical Impression Statement Pt presents to skilled PT due to difficulty having a BM. She also states that she has a hard time voiding but that happens less frequently and is less intrusive.  Pt had to void prior to today's appointment and then 30 minutes later, but then was able to "hold it".  Pt had a hard time articulating the details of her symptoms and gave a very wide range, but does "know" that she is never fully empty in her rectum.  Evaluation was mostly history taking due to complex and chronic issue.  Of note  during physical assessment, pt was more TTP on the rigth side when palpating the pelvic floor externally.  External palpation with contract and relax and had a harder time relaxing.  Contract and hold she was able to hold for 20 seconds and then felt some pain/cramping.  Pt did quick flicks without relaxing all the way between.  Pt is interested in biofeedback and possibly getting trained in how to use an at home biofeedback device.  Pt has tight hip IR/ER Rt>Lt.  No  pelvic obliquities were noted today.  Pt    Personal Factors and Comorbidities Comorbidity 3+;Time since onset of injury/illness/exacerbation    Comorbidities back pain, anxiety, hx of ankle injury, hx of eating disorder    Examination-Activity Limitations Toileting    Examination-Participation Restrictions Community Activity;Occupation    Stability/Clinical Decision Making Unstable/Unpredictable    Clinical Decision Making Moderate    PT Frequency 1x / week   then spaced out after 2 visits if needed more f/u   PT Duration 12 weeks    PT Treatment/Interventions ADLs/Self Care Home Management;Biofeedback;Electrical Stimulation;Cryotherapy;Moist Heat;Patient/family education;Therapeutic activities;Therapeutic exercise;Neuromuscular re-education;Passive range of motion;Taping;Manual techniques;Dry needling    PT Next Visit Plan biofeedback and discuss home versions    Consulted and Agree with Plan of Care Patient             Patient will benefit from skilled therapeutic intervention in order to improve the following deficits and impairments:  Pain, Impaired flexibility, Decreased coordination  Visit Diagnosis: Other lack of coordination     Problem List Patient Active Problem List   Diagnosis Date Noted   Positive ANA (antinuclear antibody) 10/19/2019   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 [place],  [place], [address] Phone: [phone]   Fax:  [phone]  Name: Catheline Hixon MRN: [id number] Date of Birth: [birth year]

## 2020-07-22 ENCOUNTER — Other Ambulatory Visit: Payer: Self-pay

## 2020-07-22 ENCOUNTER — Ambulatory Visit: Payer: 59 | Admitting: Physical Therapy

## 2020-07-22 ENCOUNTER — Encounter: Payer: Self-pay | Admitting: Physical Therapy

## 2020-07-22 ENCOUNTER — Encounter: Payer: Self-pay | Admitting: Physical Medicine and Rehabilitation

## 2020-07-22 ENCOUNTER — Encounter: Payer: 59 | Attending: Physical Medicine and Rehabilitation | Admitting: Physical Medicine and Rehabilitation

## 2020-07-22 VITALS — BP 106/72 | HR 76 | Temp 97.8°F | Ht 64.0 in | Wt 116.2 lb

## 2020-07-22 DIAGNOSIS — M791 Myalgia, unspecified site: Secondary | ICD-10-CM | POA: Diagnosis present

## 2020-07-22 DIAGNOSIS — R278 Other lack of coordination: Secondary | ICD-10-CM

## 2020-07-22 NOTE — Progress Notes (Signed)
Trigger Point Injection  Indication: Lumbar myofascial pain not relieved by medication management and other conservative care.  Informed consent was obtained after describing risk and benefits of the procedure with the patient, this includes bleeding, bruising, infection and medication side effects.  The patient wishes to proceed and has given written consent.  The patient was placed in a seated position.  The cervical area was marked and prepped with Betadine.  It was entered with a 25-gauge 1/2 inch needle and a total of 5 mL of 25% dextrose was injected into a total of 4 trigger points, after negative draw back for blood.  The patient tolerated the procedure well.  Post procedure instructions were given.

## 2020-07-22 NOTE — Therapy (Signed)
[facility] [address] [place], [place], [address] Phone: [phone]   Fax:  [phone]  Physical Therapy Treatment  Patient Details  Name: Brittany [name] MRN: [id number] Date of Birth: 05/15/1990 Referring Provider (PT): Orland Mustard, MD   Encounter Date: 07/22/2020   PT End of Session - 07/22/20 1115     Visit Number 2    Date for PT Re-Evaluation 10/08/20    Authorization Type UHC    PT Start Time 1105    PT Stop Time 1145    PT Time Calculation (min) 40 min    Activity Tolerance Patient tolerated treatment well    Behavior During Therapy [facility] for tasks assessed/performed             Past Medical History:  Diagnosis Date   Ankle injury    Anxiety    Eating disorder    Frequent headaches     Past Surgical History:  Procedure Laterality Date   ANKLE SURGERY  2018   HERNIA REPAIR     WISDOM TOOTH EXTRACTION      There were no vitals filed for this visit.   Subjective Assessment - 07/22/20 1205     Subjective No changes    Patient Stated Goals feel like I can improve the muscle control like it was when I did PT using the biofeedback    Currently in Pain? No/denies                               [facility] Adult PT Treatment/Exercise - 07/22/20 0001       Neuro Re-ed    Neuro Re-ed Details  biofeedback supine and sitting, working on contract/relax, bulge with monitoring when she is tightening and how to relax                         PT Long Term Goals - 07/16/20 1614       PT LONG TERM GOAL #1   Title Pt will be ind with HEP to work on maintaining and regaining muscle coordination    Time 12    Period Weeks    Status New    Target Date 10/08/20      PT LONG TERM GOAL #2   Title Pt will understand how to use home biofeedback unit for working on muscle coordination    Time 12    Period Weeks    Status New    Target Date 10/08/20      PT  LONG TERM GOAL #3   Title Pt will report that she feels like she has a noticeable amount of improved control of her pelvic floor muscles    Time 12    Period Weeks    Status New    Target Date 10/08/20      PT LONG TERM GOAL #4   Title ...      PT LONG TERM GOAL #5   Title .Marland KitchenMarland Kitchen                   Plan - 07/22/20 1144     Clinical Impression Statement Pt did well with biofeedback and learned that her muscles became tight when staying in the same position for a long time.  Pt was educated in different positions to use when having a BM.  Pt will benefit from skilled PT to address muscle  coordination and possibly educate on how to use home unit for biofeedback.    PT Treatment/Interventions ADLs/Self Care Home Management;Biofeedback;Electrical Stimulation;Cryotherapy;Moist Heat;Patient/family education;Therapeutic activities;Therapeutic exercise;Neuromuscular re-education;Passive range of motion;Taping;Manual techniques;Dry needling    PT Next Visit Plan biofeedback and discuss home versions    PT Home Exercise Plan more movement when sitting on the toilet    Consulted and Agree with Plan of Care Patient             Patient will benefit from skilled therapeutic intervention in order to improve the following deficits and impairments:  Pain, Impaired flexibility, Decreased coordination  Visit Diagnosis: Other lack of coordination     Problem List Patient Active Problem List   Diagnosis Date Noted   Positive ANA (antinuclear antibody) 10/19/2019   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, [address] [place], [place], [address] Phone: [phone]   Fax:  [phone]  Name: Brittany [name] MRN: [id number] Date of Birth: February 14, 1990

## 2020-07-24 ENCOUNTER — Ambulatory Visit: Payer: 59 | Admitting: Physical Medicine and Rehabilitation

## 2020-08-08 ENCOUNTER — Ambulatory Visit: Payer: 59 | Attending: Family Medicine | Admitting: Physical Therapy

## 2020-08-08 ENCOUNTER — Other Ambulatory Visit: Payer: Self-pay

## 2020-08-08 DIAGNOSIS — R278 Other lack of coordination: Secondary | ICD-10-CM | POA: Diagnosis present

## 2020-08-08 NOTE — Therapy (Signed)
[facility] [address] [place], [place], [address] Phone: [phone]   Fax:  [phone]  Physical Therapy Treatment  Patient Details  Name: Brittany [name] MRN: [id number] Date of Birth: May 15, 1990 Referring Provider (PT): Orland Mustard, MD   Encounter Date: 08/08/2020   PT End of Session - 08/08/20 1148     Visit Number 3    Date for PT Re-Evaluation 10/08/20    Authorization Type UHC    PT Start Time 1106    PT Stop Time 1145    PT Time Calculation (min) 39 min    Activity Tolerance Patient tolerated treatment well    Behavior During Therapy [facility] for tasks assessed/performed             Past Medical History:  Diagnosis Date   Ankle injury    Anxiety    Eating disorder    Frequent headaches     Past Surgical History:  Procedure Laterality Date   ANKLE SURGERY  2018   HERNIA REPAIR     WISDOM TOOTH EXTRACTION      There were no vitals filed for this visit.   Subjective Assessment - 08/08/20 1154     Subjective Some days are a little better but I guess things are the same    Patient Stated Goals feel like I can improve the muscle control like it was when I did PT using the biofeedback    Currently in Pain? No/denies                               [facility] Adult PT Treatment/Exercise - 08/08/20 0001       Neuro Re-ed    Neuro Re-ed Details  biofeedback: squatting low, step up and down, 4 sec hold, 4 rest;carrying weight around the gym                         PT Long Term Goals - 08/08/20 1152       PT LONG TERM GOAL #1   Title Pt will be ind with HEP to work on maintaining and regaining muscle coordination    Status On-going      PT LONG TERM GOAL #2   Title Pt will understand how to use home biofeedback unit for working on muscle coordination    Baseline not purchased yet    Status On-going      PT LONG TERM GOAL #3   Title Pt will report  that she feels like she has a noticeable amount of improved control of her pelvic floor muscles    Baseline varies in pain/ability to relax, no pentrative intercourse due to pain    Status On-going                   Plan - 08/08/20 1149     Clinical Impression Statement Pt did well with biofeedback and was able to get resting tone down to relaxed using gradual step up contraction and also relaxed tone when squatting.  Info given to patient for home biofeedback but this is most likely too expensive for her at this time. Pt will benefit from skilled PT to address pelvic floor coordination and plan is that she will come in later in the day next time to see how she does after being very active.    PT Treatment/Interventions ADLs/Self Care Home Management;Biofeedback;Electrical Stimulation;Cryotherapy;Moist  Heat;Patient/family education;Therapeutic activities;Therapeutic exercise;Neuromuscular re-education;Passive range of motion;Taping;Manual techniques;Dry needling    PT Next Visit Plan biofeedback after activities, f/u on "squatting" over the toileting and tightening and then relaxing    PT Home Exercise Plan more movement when sitting on the toilet    Consulted and Agree with Plan of Care Patient             Patient will benefit from skilled therapeutic intervention in order to improve the following deficits and impairments:  Pain, Impaired flexibility, Decreased coordination  Visit Diagnosis: Other lack of coordination     Problem List Patient Active Problem List   Diagnosis Date Noted   Positive ANA (antinuclear antibody) 10/19/2019   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 [place], [place], [address] Phone: [phone]   Fax:  [phone]  Name: Brittany [name] MRN: [id number] Date of Birth: [birth year]

## 2020-08-20 ENCOUNTER — Ambulatory Visit: Payer: 59 | Admitting: Physical Therapy

## 2020-08-26 ENCOUNTER — Encounter: Payer: Self-pay | Admitting: Physical Medicine and Rehabilitation

## 2020-08-26 ENCOUNTER — Encounter: Payer: 59 | Attending: Physical Medicine and Rehabilitation | Admitting: Physical Medicine and Rehabilitation

## 2020-08-26 ENCOUNTER — Other Ambulatory Visit: Payer: Self-pay

## 2020-08-26 VITALS — BP 121/71 | HR 72 | Temp 98.2°F | Ht 64.0 in | Wt 116.0 lb

## 2020-08-26 DIAGNOSIS — M791 Myalgia, unspecified site: Secondary | ICD-10-CM | POA: Insufficient documentation

## 2020-08-26 DIAGNOSIS — M2550 Pain in unspecified joint: Secondary | ICD-10-CM | POA: Insufficient documentation

## 2020-08-26 NOTE — Addendum Note (Signed)
Addended by: Horton Chin on: 08/26/2020 02:42 PM   Modules accepted: Orders

## 2020-08-26 NOTE — Progress Notes (Signed)
Trigger Point Injection  Indication: Sacral myofascial pain not relieved by medication management and other conservative care.  Informed consent was obtained after describing risk and benefits of the procedure with the patient, this includes bleeding, bruising, infection and medication side effects.  The patient wishes to proceed and has given written consent.  The patient was placed in a seated position.  The cervical area was marked and prepped with Betadine.  It was entered with a 25-gauge 1/2 inch needle and a total of 5 mL of 25% dextrose was injected into a total of 4 trigger points, after negative draw back for blood.  The patient tolerated the procedure well.  Post procedure instructions were given.

## 2020-08-30 LAB — DHEA: Dehydroepiandrosterone: 386 ng/dL (ref 31–701)

## 2020-08-30 LAB — ESTROGENS, TOTAL: Estrogen: 301 pg/mL

## 2020-08-30 LAB — TESTOSTERONE: Testosterone: 25 ng/dL (ref 13–71)

## 2020-09-03 ENCOUNTER — Ambulatory Visit: Payer: 59 | Admitting: Obstetrics and Gynecology

## 2020-09-04 ENCOUNTER — Other Ambulatory Visit: Payer: Self-pay | Admitting: Physical Medicine and Rehabilitation

## 2020-09-04 DIAGNOSIS — M2550 Pain in unspecified joint: Secondary | ICD-10-CM

## 2020-09-22 ENCOUNTER — Encounter: Payer: Self-pay | Admitting: Physical Therapy

## 2020-09-22 ENCOUNTER — Ambulatory Visit: Payer: 59 | Attending: Family Medicine | Admitting: Physical Therapy

## 2020-09-22 ENCOUNTER — Other Ambulatory Visit: Payer: Self-pay

## 2020-09-22 DIAGNOSIS — R278 Other lack of coordination: Secondary | ICD-10-CM | POA: Insufficient documentation

## 2020-09-22 DIAGNOSIS — G8929 Other chronic pain: Secondary | ICD-10-CM | POA: Diagnosis present

## 2020-09-22 DIAGNOSIS — M545 Low back pain, unspecified: Secondary | ICD-10-CM | POA: Insufficient documentation

## 2020-09-22 DIAGNOSIS — R252 Cramp and spasm: Secondary | ICD-10-CM | POA: Insufficient documentation

## 2020-09-22 NOTE — Therapy (Signed)
[facility] [address] [place], [place], [address] Phone: [phone]   Fax:  [phone]  Physical Therapy Treatment  Patient Details  Name: Keishana Klinger MRN: [id number] Date of Birth: January 16, 1990 Referring Provider (PT): Orma Flaming, MD   Encounter Date: 09/22/2020   PT End of Session - 09/22/20 1631     Visit Number 4    Date for PT Re-Evaluation 10/08/20    Authorization Type UHC    PT Start Time 0947   late   PT Stop Time 1709    PT Time Calculation (min) 38 min    Activity Tolerance Patient tolerated treatment well    Behavior During Therapy [facility] for tasks assessed/performed             Past Medical History:  Diagnosis Date   Ankle injury    Anxiety    Eating disorder    Frequent headaches     Past Surgical History:  Procedure Laterality Date   ANKLE SURGERY  2018   HERNIA REPAIR     WISDOM TOOTH EXTRACTION      There were no vitals filed for this visit.   Subjective Assessment - 09/22/20 1636     Subjective I am spending a lot of my time with the same problems, but more often it has been a little more manageable.  I think the chiropractor 2x/week has helped    Patient Stated Goals feel like I can improve the muscle control like it was when I did PT using the biofeedback    Pain Score 5     Pain Location Abdomen    Pain Orientation Lower    Pain Descriptors / Indicators Sharp    Pain Onset More than a month ago    Pain Frequency Intermittent    Multiple Pain Sites No                               [facility] Adult PT Treatment/Exercise - 09/22/20 0001       Self-Care   Self-Care Other Self-Care Comments    Other Self-Care Comments  info on impressa if she feels like that helps her feeling of laxity; educated and reviewed ways to get more relaxation on the toilet      Neuro Re-ed    Neuro Re-ed Details  biofeedback: did contract/relax and that helped; did  push ups and mV up to 12-15 then able to relax and get back down to starting resting tone of 32m.                          PT Long Term Goals - 08/08/20 1152       PT LONG TERM GOAL #1   Title Pt will be ind with HEP to work on maintaining and regaining muscle coordination    Status On-going      PT LONG TERM GOAL #2   Title Pt will understand how to use home biofeedback unit for working on muscle coordination    Baseline not purchased yet    Status On-going      PT LONG TERM GOAL #3   Title Pt will report that she feels like she has a noticeable amount of improved control of her pelvic floor muscles    Baseline varies in pain/ability to relax, no pentrative intercourse due to pain    Status On-going  Plan - 09/22/20 1713     Clinical Impression Statement Pt is doing better overall.  She was able to see how her tone was towards the end of the day and after exercises using biofeedback.  Able to get resting tone down to 93m.  She is essentially ind with her HEP.  THe main concern is her overthinking and tightening up during a BM which she feels she can work on her own and PT agrees.  Pt to d/c today.    PT Treatment/Interventions ADLs/Self Care Home Management;Biofeedback;Electrical Stimulation;Cryotherapy;Moist Heat;Patient/family education;Therapeutic activities;Therapeutic exercise;Neuromuscular re-education;Passive range of motion;Taping;Manual techniques;Dry needling    PT Next Visit Plan d/c    Consulted and Agree with Plan of Care Patient             Patient will benefit from skilled therapeutic intervention in order to improve the following deficits and impairments:  Pain, Impaired flexibility, Decreased coordination  Visit Diagnosis: Other lack of coordination  Chronic midline low back pain without sciatica  Cramp and spasm     Problem List Patient Active Problem List   Diagnosis Date Noted   Positive ANA (antinuclear  antibody) 10/19/2019   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 [place] [address]Phone: [phone]  Fax:  [phone] Name: MChristyanna MckeonMRN: [id number] of Birth: [birth year] PHYSICAL THERAPY DISCHARGE SUMMARY  Visits from Start of Care: 4  Current functional level related to goals / functional outcomes: See above   Remaining deficits: See above   Education / Equipment: HEP   Patient agrees to discharge. Patient goals were met. Patient is being discharged due to being pleased with the current functional level.  JGustavus Bryant PT 09/22/20 5:18 PM

## 2020-10-03 ENCOUNTER — Ambulatory Visit
Admission: RE | Admit: 2020-10-03 | Discharge: 2020-10-03 | Disposition: A | Discharge status: Home / Self Care | Payer: 59 | Source: Ambulatory Visit | Attending: Obstetrics and Gynecology | Admitting: Obstetrics and Gynecology

## 2020-10-03 ENCOUNTER — Other Ambulatory Visit: Payer: Self-pay

## 2020-10-03 DIAGNOSIS — N6320 Unspecified lump in the left breast, unspecified quadrant: Secondary | ICD-10-CM

## 2020-10-09 ENCOUNTER — Encounter: Payer: 59 | Admitting: Physical Medicine and Rehabilitation

## 2020-10-14 DIAGNOSIS — M2550 Pain in unspecified joint: Secondary | ICD-10-CM

## 2020-10-24 ENCOUNTER — Other Ambulatory Visit: Payer: Self-pay

## 2020-10-24 ENCOUNTER — Ambulatory Visit (INDEPENDENT_AMBULATORY_CARE_PROVIDER_SITE_OTHER): Payer: 59 | Admitting: Physician Assistant

## 2020-10-24 ENCOUNTER — Encounter: Payer: Self-pay | Admitting: Physician Assistant

## 2020-10-24 VITALS — BP 110/80 | HR 67 | Temp 98.2°F | Ht 64.0 in | Wt 116.0 lb

## 2020-10-24 DIAGNOSIS — F909 Attention-deficit hyperactivity disorder, unspecified type: Secondary | ICD-10-CM

## 2020-10-24 DIAGNOSIS — F411 Generalized anxiety disorder: Secondary | ICD-10-CM | POA: Diagnosis not present

## 2020-10-24 DIAGNOSIS — M2420 Disorder of ligament, unspecified site: Secondary | ICD-10-CM

## 2020-10-24 DIAGNOSIS — G259 Extrapyramidal and movement disorder, unspecified: Secondary | ICD-10-CM | POA: Diagnosis not present

## 2020-10-24 NOTE — Progress Notes (Signed)
Brittany [name] is a 30 y.o. female here for a follow up referred by former PCP, Dr. Artis Flock.    History of Present Illness:   Chief Complaint  Patient presents with  . Transfer of care    HPI  Anxiety Currently compliant with taking lexapro 20 mg with no adverse effects.   ADHD Currently compliant with vyvanse 10 mg with no adverse effects. Due to current heath issues, she has expressed increased difficulty focusing and staying motivated. She feels her medication has a positive effect but is greatly benefiting from therapy.   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 Rubin Payor , neurology. She has since not returned to any neurologist but briefly participated in PT with Dr. Wallis Mart. At this time she is hoping for a hormonal explanation and is currently interested in seeing an endocrinologist.   As time went on Brittany [name] realized that a trigger for her disorder is increased anxiety. She is currently seeing a chiropractor as well as performing exercises she learned in PT for her pelvic floor dysfunction.   Ligament Laxity In April of this year she was dx with pelvic floor dysfunction by Dr. Carlis Abbott, physical medicine and rehabilitation, after experiencing bowel incontinence and pelvic floor pain. Shortly after she began PT she expressed it to be a great benefit to her.   At this time she is able to connect her pelvic floor dysfunction with her functional movement disorder and effectively search for treatment options. Brittany [name] is determined to get back to running and living her life freely.       Past Medical History:  Diagnosis Date  . Ankle injury   . Anxiety   . Eating disorder   . Frequent headaches      Social History   Tobacco Use  . Smoking status: Never  . Smokeless tobacco: Never  Vaping Use  . Vaping Use: Never used  Substance Use Topics  . Alcohol use: No    Alcohol/week: 0.0 standard  drinks  . Drug use: No    Past Surgical History:  Procedure Laterality Date  . ANKLE SURGERY  2018  . HERNIA REPAIR    . WISDOM TOOTH EXTRACTION      Family History  Problem Relation Age of Onset  . Depression Mother   . Anxiety disorder Mother   . Depression Brother   . Anxiety disorder Brother   . Diabetes Maternal Grandmother   . Hearing loss Maternal Grandmother   . Alcohol abuse Maternal Grandfather   . Depression Maternal Grandfather   . Diabetes Maternal Grandfather   . Dementia Paternal Grandfather   . Thyroid disease Brother     No Known Allergies  Current Medications:   Current Outpatient Medications:  .  escitalopram (LEXAPRO) 20 MG tablet, Take 1 tablet (20 mg total) by mouth daily., Disp: 90 tablet, Rfl: 0 .  lisdexamfetamine (VYVANSE) 10 MG capsule, Take 10 mg by mouth daily., Disp: , Rfl:  .  MAGNESIUM CITRATE PO, Take by mouth., Disp: , Rfl:    Review of Systems:   ROS Negative unless otherwise specified per HPI. Vitals:   Vitals:   10/24/20 1433  BP: 110/80  Pulse: 67  Temp: 98.2 F (36.8 C)  TempSrc: Temporal  SpO2: 99%  Weight: 116 lb (52.6 kg)  Height: 5\' 4"  (1.626 m)     Body mass index is 19.91 kg/m.  Physical Exam:   Physical Exam Constitutional:  Appearance: Normal appearance. She is well-developed.  HENT:     Head: Normocephalic and atraumatic.  Eyes:     General: Lids are normal.     Extraocular Movements: Extraocular movements intact.     Conjunctiva/sclera: Conjunctivae normal.  Pulmonary:     Effort: Pulmonary effort is normal.  Musculoskeletal:        General: Normal range of motion.     Cervical back: Normal range of motion and neck supple.  Skin:    General: Skin is warm and dry.  Neurological:     Mental Status: She is alert and oriented to person, place, and time.  Psychiatric:        Attention and Perception: Attention and perception normal.        Mood and Affect: Mood normal.        Behavior:  Behavior normal.        Thought Content: Thought content normal.        Judgment: Judgment normal.    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   I, Jarold Motto, [place], have reviewed all documentation for this visit. The documentation on 10/24/20 for the exam, diagnosis, procedures, and orders are all accurate and complete.   Jarold Motto, PA-C

## 2020-10-24 NOTE — Patient Instructions (Signed)
It was great to see you!  Gayla Doss -- Physical Therapist over at [facility] specializes in some connective tissue disorders  Dr. Sharl Ma --> Endocrinology [facility] @ [facility] [address] [place], [place] [address] [phone]  I am putting referral here for endocrinology for you as well: [facility] [address]  [place], [place] [address] [phone]  Take care,  Jarold Motto PA-C

## 2020-10-31 ENCOUNTER — Telehealth: Payer: Self-pay

## 2020-10-31 NOTE — Telephone Encounter (Signed)
Angie from [facility] called regarding a referral. She stated that they received a referral from [place] but they are needing more information. Angie stated that they need office notes and a copy of the insurance card. Information can be faxed to [phone]. Please Advise.

## 2020-10-31 NOTE — Telephone Encounter (Signed)
Office notes and copy of insurance card faxed to Angie at [facility].

## 2020-11-06 ENCOUNTER — Telehealth: Payer: Self-pay

## 2020-11-06 NOTE — Telephone Encounter (Signed)
Alaina from [facility] called regarding a referral. She stated that they need lab results, office notes, and a copy of the insurance card faxed over. Fx number is [phone]. Please Advise.

## 2020-11-07 NOTE — Telephone Encounter (Signed)
[facility] and left detailed message on personal voicemail that I had already faxed over office notes and copy of insurance card, there is no recent labs. If you need me to send again please let me know.

## 2020-11-11 NOTE — Telephone Encounter (Signed)
[facility] called and stated that they have not received records. Can insurance card and office notes be resent to [phone]. Please Advise.

## 2020-11-11 NOTE — Telephone Encounter (Signed)
Office notes and copy of insurance cards faxed again to [facility] at [phone].

## 2020-11-19 LAB — HM DEXA SCAN

## 2020-11-19 LAB — VITAMIN D 25 HYDROXY (VIT D DEFICIENCY, FRACTURES): Vit D, 25-Hydroxy: 39.8

## 2020-11-24 ENCOUNTER — Other Ambulatory Visit: Payer: Self-pay | Admitting: Physical Medicine and Rehabilitation

## 2020-11-24 DIAGNOSIS — M2550 Pain in unspecified joint: Secondary | ICD-10-CM

## 2020-12-03 ENCOUNTER — Other Ambulatory Visit: Payer: Self-pay | Admitting: Physical Medicine and Rehabilitation

## 2020-12-04 NOTE — Progress Notes (Signed)
Subjective:    Patient ID: Brittany [name], female    DOB: October 22, 1990, 30 y.o.   MRN: [id number]  HPI  Brittany [name] is a 30 year old woman who presents for follow-up of chronic pelvic pain.  1) Pelvic floor dysfunction: -She has done PT which she feels is a temporary band aid.  -Average pain is 7/10 -She feels that some, but not all of the pain is from anxiety. 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  -pain has been more bearable -she is trying to find a lot of mental health help -she is continuing with therapy -pain is still very [name] often and extremely life-limiting.  -she has benefited from working with chiropractor -she is very limited in what she can physically do -sh has tried valium suppositories -she uses a splint and release tool and it helps to keep the prolapse in place to that she can have a more normal BM.   2) Hypermobility: - she has chronic ankle injuries as a result -she fees that a lot of her tightness is related to her OCD -she recently got EDS test -she will be following with endocrinology  3) Throat tightness: -she loves to sing and this has inhibited her ability to do so.   3) Lower back pain -improved with chiropractor  4) Anxiety -is trying valium suppositories  Pain Inventory Average Pain 8 Pain Right Now 4 My pain is intermittent, constant, sharp, burning, dull, and aching  In the last 24 hours, has pain interfered with the following? General activity 8 Relation with others 4 Enjoyment of life 9 What TIME of day is your pain at its worst? varies Sleep (in general) Fair  Pain is worse with: some activites Pain improves with: therapy/exercise Relief from Meds: 0  walk without assistance how many minutes can you walk? depends ability to climb steps?  yes do you drive?  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  [employer]  bladder control problems bowel control problems weakness numbness spasms dizziness depression anxiety       Family History  Problem Relation Age of Onset   Depression Mother    Anxiety disorder Mother    Depression Brother    Anxiety disorder Brother    Diabetes Maternal Grandmother    Hearing loss Maternal Grandmother    Alcohol abuse Maternal Grandfather    Depression Maternal Grandfather    Diabetes Maternal Grandfather    Dementia Paternal Grandfather    Thyroid disease Brother    Social History   Socioeconomic History   Marital status: Married    Spouse name: Not on file   Number of children: Not on file   Years of education: Not on file   Highest education level: Not on file  Occupational History   Not on file  Tobacco Use   Smoking status: Never   Smokeless tobacco: Never  Vaping Use   Vaping Use: Never used  Substance and Sexual Activity   Alcohol use: No    Alcohol/week: 0.0 standard drinks   Drug use: No   Sexual activity: Yes    Birth control/protection: None  Other Topics Concern   Not on file  Social History Narrative   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[place]   Social Determinants of Health   Financial Resource Strain: Not on file  Food Insecurity: Not on file  Transportation Needs: Not on file  Physical Activity: Not on file  Stress: Not on file  Social Connections: Not on file   Past Surgical History:  Procedure Laterality Date   ANKLE SURGERY  2018   HERNIA REPAIR     WISDOM TOOTH EXTRACTION     Past Medical History:  Diagnosis Date   Ankle injury    Anxiety    Eating disorder    Frequent headaches    BP 118/72 (BP Location: Right Arm, Patient Position: Sitting)   Pulse 65   Temp 98.8 F (37.1 C) (Oral)   Ht 5\' 4"  (1.626 m)   Wt 115 lb (52.2 kg)   SpO2 98%   BMI 19.74 kg/m   Opioid Risk Score:   Fall Risk Score:  `1  Depression screen PHQ 2/9  Depression screen [name] 2/9 10/24/2020 08/26/2020 07/22/2020  04/07/2020 04/02/2019 02/09/2016 09/04/2015  Decreased Interest 1 1 1 1  0 0 0  Down, Depressed, Hopeless 2 1 1 1 1  0 0  PHQ - 2 Score 3 2 2 2 1  0 0  Altered sleeping 1 - - 1 0 - -  Tired, decreased energy 1 - - 1 1 - -  Change in appetite 0 - - 0 0 - -  Feeling [name] or failure about yourself  3 - - 3 3 - -  Trouble concentrating 3 - - 3 2 - -  Moving slowly or fidgety/restless 1 - - 1 0 - -  Suicidal thoughts 0 - - 1 2 - -  PHQ-9 Score 12 - - 12 9 - -  Difficult doing work/chores Very difficult - - Extremely dIfficult Somewhat difficult - -   Review of Systems  Constitutional: Negative.        Night sweats  HENT: Negative.    Eyes: Negative.   Respiratory: Negative.    Cardiovascular: Negative.   Gastrointestinal:  Positive for abdominal pain, constipation and diarrhea.  Endocrine: Negative.   Musculoskeletal:  Positive for back pain and neck pain.       Stomach and pelvic area  Skin: Negative.   Allergic/Immunologic: Negative.   Psychiatric/Behavioral:  The patient is nervous/anxious.   All other systems reviewed and are negative.     Objective:   Physical Exam Gen: no distress, normal appearing HEENT: oral mucosa pink and moist, NCAT Cardio: Reg rate Chest: normal effort, normal rate of breathing Abd: soft, non-distended Ext: no edema Psych: pleasant, normal affect Skin: intact Neuro: Alert and oriented x3 Musculoskeletal: diffuse tightness, active spasms during exam    Assessment & Plan:  Brittany [name] is a 30 year old woman who presents to establish care for pelvic floor dysfunction.  1) Pelvic floor dysfunction: -she takes magnesium citrate and even then she cannot have complete evacuation. -once in a while she has bowel incontinence -She has pelvic floor pain and has had great benefit from pelvic floor therapists.  -discussed pudendal nerve entrapment, prolotherapy. Provided link for further education.  -discussed TCA. -continue magnesium citrate.  -this has a huge  impact on her quality of life.  -discussed anti-spasticity medications and Botox. Extensively discussed the benefits of Botox.  -Provided with a pain relief journal and discussed that it contains foods and lifestyle tips to naturally help to improve pain. Discussed that these lifestyle strategies are also very good for health unlike some medications which can have negative side effects. Discussed that the act of keeping a journal can be therapeutic  and helpful to realize patterns what helps to trigger and alleviate pain.   -continue internal massaging  2) Anxiety: -continue Lexapro -she is seeing a mental health therapist.  -continue valium suppositories  3) Sweating profusely at times: -could be autonomic dysfunction.   4) Hypermobility: -discussed with her that I will call as soon as EDS result returns.   5) Constipation: -continue mag citrate as needed, discussed its health benefits.   2:05

## 2020-12-05 ENCOUNTER — Encounter: Payer: Self-pay | Admitting: Physical Medicine and Rehabilitation

## 2020-12-05 ENCOUNTER — Encounter: Payer: 59 | Attending: Physical Medicine and Rehabilitation | Admitting: Physical Medicine and Rehabilitation

## 2020-12-05 ENCOUNTER — Other Ambulatory Visit: Payer: Self-pay

## 2020-12-05 VITALS — BP 118/72 | HR 65 | Temp 98.8°F | Ht 64.0 in | Wt 115.0 lb

## 2020-12-05 DIAGNOSIS — M6289 Other specified disorders of muscle: Secondary | ICD-10-CM | POA: Diagnosis not present

## 2020-12-05 DIAGNOSIS — M545 Low back pain, unspecified: Secondary | ICD-10-CM | POA: Diagnosis not present

## 2020-12-05 DIAGNOSIS — M2550 Pain in unspecified joint: Secondary | ICD-10-CM | POA: Insufficient documentation

## 2020-12-05 DIAGNOSIS — G8929 Other chronic pain: Secondary | ICD-10-CM | POA: Insufficient documentation

## 2020-12-16 ENCOUNTER — Encounter: Payer: Self-pay | Admitting: Physical Medicine and Rehabilitation

## 2020-12-30 LAB — GENESEQ: FAMILIAL AORTOPATHY

## 2021-01-02 ENCOUNTER — Other Ambulatory Visit: Payer: Self-pay

## 2021-01-02 ENCOUNTER — Encounter: Payer: 59 | Admitting: Physical Medicine and Rehabilitation

## 2021-01-02 DIAGNOSIS — M2550 Pain in unspecified joint: Secondary | ICD-10-CM

## 2021-01-03 NOTE — Progress Notes (Signed)
Left message with lab results

## 2021-01-04 HISTORY — PX: INTERSTIM IMPLANT PLACEMENT: SHX5130

## 2021-01-04 HISTORY — PX: INTERSTIM IMPLANT REMOVAL: SHX5131

## 2021-01-05 ENCOUNTER — Encounter: Payer: Self-pay | Admitting: Physical Medicine and Rehabilitation

## 2021-01-30 ENCOUNTER — Telehealth: Payer: Self-pay | Admitting: Endocrinology

## 2021-01-30 ENCOUNTER — Encounter (INDEPENDENT_AMBULATORY_CARE_PROVIDER_SITE_OTHER): Payer: Self-pay

## 2021-01-30 NOTE — Telephone Encounter (Signed)
Pt called and stated that she is no longer needing our services she is currently seeing another one.  Pt would like to be removed from the calling list.

## 2021-02-18 ENCOUNTER — Encounter: Payer: Self-pay | Admitting: Physical Medicine and Rehabilitation

## 2021-03-05 ENCOUNTER — Encounter: Payer: Self-pay | Admitting: Physician Assistant

## 2021-03-16 ENCOUNTER — Ambulatory Visit: Payer: 59 | Admitting: Physician Assistant

## 2021-03-16 ENCOUNTER — Encounter: Payer: Self-pay | Admitting: Physician Assistant

## 2021-03-16 VITALS — BP 98/68 | HR 76 | Temp 98.3°F | Ht 64.0 in | Wt 118.6 lb

## 2021-03-16 DIAGNOSIS — M357 Hypermobility syndrome: Secondary | ICD-10-CM | POA: Diagnosis not present

## 2021-03-16 DIAGNOSIS — M6289 Other specified disorders of muscle: Secondary | ICD-10-CM

## 2021-03-16 NOTE — Patient Instructions (Signed)
It was great to see you! ? ?Please let me know if you would like to see the hypermobility PT in [facility] ? ?A referral has been placed for you to see Dr. Clementeen Graham with [facility]. ?Someone from their office will be in touch soon regarding your appointment with him. ? ?I will continue to look for a place for you to have internal biofeedback and be in touch when I find a location. ? ?Take care, ? ?Jarold Motto PA-C  ?

## 2021-03-16 NOTE — Progress Notes (Signed)
Brittany [name] is a 31 y.o. female here for a follow up of a pre-existing problem. ? ?History of Present Illness:  ? ?Chief Complaint  ?Patient presents with  ? Follow-up  ?  Trying to get a workable diagnosis for hypermobile spectrum disorder and has other things to discuss  ? ? ?HPI ? ?Hypermobility Syndrome ?According to pt, she had a DEXA scan completed in November of 2022 as ordered by Dr. Gayla Medicus, endocrinology which found she had markers for hypermobility syndrome. After discovering this, she began researching this possible dx and found that most of her sx are aligned with this disorder. As a result she would like to be referred to whoever can properly diagnose her with this condition so she can have the various treatments covered in the future.  ? ?Currently she is set to visit Dr. Darcella Gasman, PT for further evaluation due to a friend of hers with EDS finding him to be a great provider as well.  ? ? ?Pelvic Floor Dysfunction ?In terms of her pelvic floor dysfunction, she has looked up additional treatments including sacral neuromodulation which she was informed could be completed by her urologist Lavella Hammock. Although this is an option she admits she is a bit hesitant that it could be completed successfully due to her current anatomy and how often her sacrum moves. Due to this she did further research and found a procedure known as internal bio feedback. At this time she is unable to find a provider who offers this form of treatment and is wondering if she could be referred to someone who performs this.  ? ? ?Past Medical History:  ?Diagnosis Date  ? Ankle injury   ? Anxiety   ? Eating disorder   ? Frequent headaches   ? ?  ?Social History  ? ?Tobacco Use  ? Smoking status: Never  ? Smokeless tobacco: Never  ?Vaping Use  ? Vaping Use: Never used  ?Substance Use Topics  ? Alcohol use: No  ?  Alcohol/week: 0.0 standard drinks  ? Drug use: No  ? ? ?Past Surgical History:  ?Procedure  Laterality Date  ? ANKLE SURGERY  2018  ? HERNIA REPAIR    ? WISDOM TOOTH EXTRACTION    ? ? ?Family History  ?Problem Relation Age of Onset  ? Depression Mother   ? Anxiety disorder Mother   ? Depression Brother   ? Anxiety disorder Brother   ? Diabetes Maternal Grandmother   ? Hearing loss Maternal Grandmother   ? Alcohol abuse Maternal Grandfather   ? Depression Maternal Grandfather   ? Diabetes Maternal Grandfather   ? Dementia Paternal Grandfather   ? Thyroid disease Brother   ? ? ?No Known Allergies ? ?Current Medications:  ? ?Current Outpatient Medications:  ?  escitalopram (LEXAPRO) 20 MG tablet, Take 1 tablet (20 mg total) by mouth daily., Disp: 90 tablet, Rfl: 0 ?  lisdexamfetamine (VYVANSE) 10 MG capsule, Take 10 mg by mouth daily., Disp: , Rfl:  ?  MAGNESIUM CITRATE PO, Take by mouth., Disp: , Rfl:   ? ?Review of Systems:  ? ?ROS ?Negative unless otherwise specified per HPI. ?Vitals:  ? ?Vitals:  ? 03/16/21 1458  ?BP: 98/68  ?Pulse: 76  ?Temp: 98.3 ?F (36.8 ?C)  ?TempSrc: Temporal  ?SpO2: 96%  ?Weight: 118 lb 9.6 oz (53.8 kg)  ?Height: 5\' 4"  (1.626 m)  ?   ?Body mass index is 20.36 kg/m?. ? ?Physical Exam:  ? ?Physical Exam ?Vitals and  nursing note reviewed.  ?Constitutional:   ?   General: She is not in acute distress. ?   Appearance: She is well-developed. She is not ill-appearing or toxic-appearing.  ?Cardiovascular:  ?   Rate and Rhythm: Normal rate and regular rhythm.  ?   Pulses: Normal pulses.  ?   Heart sounds: Normal heart sounds, S1 normal and S2 normal.  ?Pulmonary:  ?   Effort: Pulmonary effort is normal.  ?   Breath sounds: Normal breath sounds.  ?Skin: ?   General: Skin is warm and dry.  ?Neurological:  ?   Mental Status: She is alert.  ?   GCS: GCS eye subscore is 4. GCS verbal subscore is 5. GCS motor subscore is 6.  ?Psychiatric:     ?   Speech: Speech normal.     ?   Behavior: Behavior normal. Behavior is cooperative.  ? ? ?Assessment and Plan:  ? ?Benign hypermobility syndrome ?No red  flags  ?Will refer to Dr. Denyse Amass, sports medicine for further discussion  ?Advised patient to reach out to [place] if still interested in hypermobility PT in [facility] with Faythe Casa, PT ? ?Pelvic floor dysfunction ?Informed patient that I will reach out in regards to more information on internal biofeedback  ? ? ?I,Havlyn C Ratchford,acting as a scribe for [employer], PA.,have documented all relevant documentation on the behalf of Jarold Motto, PA,as directed by  Jarold Motto, PA while in the presence of Jarold Motto, [place]. ? ?IJarold Motto, PA, have reviewed all documentation for this visit. The documentation on 03/16/21 for the exam, diagnosis, procedures, and orders are all accurate and complete. ? ?Time spent with patient today was 60 minutes which consisted of chart review, discussing diagnosis, work up, treatment answering questions and documentation. ? ?Jarold Motto, PA-C ? ?

## 2021-03-19 ENCOUNTER — Encounter: Payer: Self-pay | Admitting: Physician Assistant

## 2021-03-23 ENCOUNTER — Encounter: Payer: Self-pay | Admitting: Family Medicine

## 2021-03-23 ENCOUNTER — Other Ambulatory Visit: Payer: Self-pay

## 2021-03-23 ENCOUNTER — Ambulatory Visit: Payer: 59 | Admitting: Family Medicine

## 2021-03-23 DIAGNOSIS — M357 Hypermobility syndrome: Secondary | ICD-10-CM

## 2021-03-23 NOTE — Patient Instructions (Addendum)
Good to see you today. ? ?Let me know if you want me to order the echocardiogram ? ?Follow-up: as needed  ?

## 2021-03-23 NOTE — Progress Notes (Signed)
? ?  I, Peterson Lombard, LAT, ATC acting as a scribe for Brittany Leader, MD. ? ?Brittany [name] is a 31 y.o. female who presents to [place] [facility] today for evaluation of her hypermobility. Pt was previously seen by Dr. Georgina Snell on 05/08/19 for R foot pain. According to pt, she had a DEXA scan completed in November of 2022 as ordered by Dr. Garnet Koyanagi, endocrinology which found she had markers for hypermobility syndrome. She was suggested to come here by Sam.   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 ? ?Pertinent review of systems: No fevers or chills ? ?Relevant historical information: Pelvic floor dysfunction ?Frequent joint injuries and dislocations. ? ? ?Exam:  ?BP 104/64 (BP Location: Left Arm, Patient Position: Sitting, Cuff Size: Normal)   Pulse 80   Ht 5\' 4"  (1.626 m)   Wt 117 lb 12.8 oz (53.4 kg)   SpO2 98%   BMI 20.22 kg/m?  ?General: Well Developed, well nourished, and in no acute distress.  ? ?MSK: Beighton score 8/9 ? ? ? ? ? ? ? ?Assessment and Plan: ?31 y.o. female with Maralynn he has a hypermobility syndrome.  She may have Ehlers-Danlos but will need an echocardiogram to confirm mitral valve prolapse or any aortic root dilation confirm the diagnosis.  We do not think at this time that is necessary but she will do some thinking and will let me know if she would like me to order an echocardiogram. ? ?She is already receiving what I think is good treatment with physical therapy.  She has a good physical therapist to [facility] that understands airless Danlos quite well.  She is also receiving pelvic floor PT. ? ?Happy to assist with conversations with doctors in the future. ? ?Unfortunately she is a very definitive person in her personality type of conflict greatly with the vagueness of this diagnosis and she finds it to be frustrating.  I expressed my sympathy that this is an annoying and frustrating  experience for her.  We also did some problem-solving on kinds of exercises that she can do. ? ? ?Discussed warning signs or symptoms. Please see discharge instructions. Patient expresses understanding. ? ? ?The above documentation has been reviewed and is accurate and complete Brittany [name], M.D. ? ? ?

## 2021-04-06 NOTE — Telephone Encounter (Signed)
See note

## 2021-04-09 ENCOUNTER — Encounter: Payer: Self-pay | Admitting: Physician Assistant

## 2021-04-13 ENCOUNTER — Other Ambulatory Visit: Payer: Self-pay | Admitting: Physician Assistant

## 2021-04-13 DIAGNOSIS — M6289 Other specified disorders of muscle: Secondary | ICD-10-CM

## 2021-04-13 NOTE — Therapy (Addendum)
?OUTPATIENT PHYSICAL THERAPY FEMALE PELVIC EVALUATION ? ? ?Patient Name: Brittany [name] ?MRN: [id number] ?DOB:11/10/1990, 31 y.o., female ?Today's Date: 04/14/2021 ? ? PT End of Session - 04/14/21 1215   ? ? Visit Number 1   ? Date for PT Re-Evaluation 07/07/21   ? PT Start Time 1156   ? PT Stop Time 1230   ? PT Time Calculation (min) 34 min   ? Activity Tolerance Patient tolerated treatment well   ? Behavior During Therapy [facility] for tasks assessed/performed   ? ?  ?  ? ?  ? ? ?Past Medical History:  ?Diagnosis Date  ? Ankle injury   ? Anxiety   ? Eating disorder   ? Frequent headaches   ? ?Past Surgical History:  ?Procedure Laterality Date  ? ANKLE SURGERY  2018  ? HERNIA REPAIR    ? WISDOM TOOTH EXTRACTION    ? ?Patient Active Problem List  ? Diagnosis Date Noted  ? Positive ANA (antinuclear antibody) 10/19/2019  ? 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 Anorexia nervosa, restricting type, in full remission, moderate 02/16/2018  ? History of depression 02/16/2018  ? GAD (generalized anxiety disorder) 12/16/2017  ? Pelvic floor dysfunction 12/16/2017  ? Right hip pain 11/02/2017  ? Grade 2 ankle sprain, right, initial encounter 05/16/2016  ? 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  ? 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 Right ankle pain 03/03/2015  ? IBS (irritable bowel syndrome) 05/25/2009  ? ? ?PCP: Jarold Motto, PA ? ?REFERRING PROVIDER: Jarold Motto, PA ? ?REFERRING DIAG: M62.89 (ICD-10-CM) - Pelvic floor dysfunction ? ?THERAPY DIAG:  ?Other lack of coordination ? ?Cramp and spasm ? ?ONSET DATE: chronic insidious onset ? ?SUBJECTIVE:                                                                                                                                                                                          ? ?SUBJECTIVE STATEMENT: ?I have small things come out throughout the day.  I have a lot of BM in the morning but they are small. ?Fluid intake: Yes: lot of water   ? ?Patient confirms  identification and approves PT to assess pelvic floor and treatment Yes ? ? ?PAIN:  ?Are you having pain? No ? ?PRECAUTIONS: None ? ?WEIGHT BEARING RESTRICTIONS No ? ?FALLS:  ?Has patient fallen in last 6 months? No ? ?LIVING ENVIRONMENT: ?Lives with: lives with their family and lives with their spouse ?OCCUPATION: full time ? ?PLOF: Independent ? ?PATIENT GOALS better bowel movements ? ?PERTINENT HISTORY:  ?Chronic issue ?Sexual abuse: No ? ?BOWEL MOVEMENT ?Pain with bowel movement: Yes sometmes usually abdominal ?Type  of bowel movement:Type (Bristol Stool Scale) small balls or diarrhea ?Fully empty rectum: No ?Leakage: Yes: sometimes ?Pads: Yes: for bladder ?Fiber supplement: Yes: magnesium ? ?URINATION ?Pain with urination: Yes ?Fully empty bladder: Yes:   ?Stream: Strong ?Urgency: Yes:   ?Frequency: sometimes incessant ?Leakage:  little bits due to constipation ?Pads: Yes: 1-2 ? ?INTERCOURSE ?Pain with intercourse:  cannot have penetration  and cannot have often even without penetration ?Ability to have vaginal penetration:  No ?Climax: can climax have had pain afterwards ?Marinoff Scale: 3/3 ? ?PREGNANCY ?Vaginal deliveries n/a ? ? ?PROLAPSE ?Rectocele   ? ? ? ?OBJECTIVE:  ? ?DIAGNOSTIC FINDINGS:  ?Anorectal manometry, US - dyssynergia ? ?COGNITION: ? Overall cognitive status: Within functional limits for tasks assessed   ?  ? ? ?LUMBAR SPECIAL TESTS:  ?Not tested due to time ? ?FUNCTIONAL TESTS:  ?Not tested due to time ? ?GAIT: ?Comments: WFL ? ?POSTURE:  ?Anterior pelvic tilt ? ?LUMBARAROM/PROM ? ? (Blank rows = not tested) ? ?LE ROM: ? ?Not tested due to time (Blank rows = not tested) ? ?LE MMT: ?Time constraints ? ?PELVIC MMT: ?  ?MMT  ?04/14/2021  ?Vaginal   ?Internal Anal Sphincter 3/5  ?External Anal Sphincter 2/5  ?Puborectalis 4/5  ?Diastasis Recti   ?(Blank rows = not tested) ? ?      PALPATION: ?  General  tight lumbar paraspinals ? ?              External Perineal Exam normal ?              ?               Internal Pelvic Floor TTP of sphincters and difficulty relaxing; can bulge partially; unable to palpate beyond puborectalis ? ?TONE: ?high ? ?PROLAPSE: ?None noted in supine ? ?TODAY'S TREATMENT  ?EVAL educated on rectal dilators and internal EMG sensor and where to purchase ? ? ?PATIENT EDUCATION:  ?Education details: tools to work on bowel movements   ?Person educated: Patient ?Education method: Explanation ?Education comprehension: verbalized understanding ? ? ?HOME EXERCISE PROGRAM: ?Purchase choice of rectal neuro re-ed equipment to use in clinic and at home ? ?ASSESSMENT: ? ?CLINICAL IMPRESSION: ?Patient is a 31 y.o. female who was seen today for physical therapy evaluation and treatment for chronic constipation and pelvic floor dyssynergia. Pt arrived late and limited time restricted assessment.  Pt has tight pelvic floor and only able to relax slightly when bulging the muscles.  Pt is recommended to have skilled PT to address impairments mentioned above and additional impairments mentioned in ongoing assessment at subsequent follow up visits in order to be able to have a complete BM. ? ? ?OBJECTIVE IMPAIRMENTS decreased coordination, decreased endurance, decreased strength, increased muscle spasms, impaired flexibility, impaired tone, and pain.  ? ?ACTIVITY LIMITATIONS  toileting and interpersonal relationships .  ? ?PERSONAL FACTORS Time since onset of injury/illness/exacerbation and connective tissue disorder  are also affecting patient's functional outcome.  ? ? ?REHAB POTENTIAL: Good ? ?CLINICAL DECISION MAKING: Evolving/moderate complexity ? ?EVALUATION COMPLEXITY: Moderate ? ? ?GOALS: ?Goals reviewed with patient? Yes ? ?SHORT TERM GOALS: Target date: 05/13/2021 ? ?Pt will have either dilator or internal sensor to use for treatment ?Baseline: ?Goal status: INITIAL ? ? ?LONG TERM GOALS: Target date: 08/04/21 ? ?Pt will be able to have at least 3 days / week where she has a good bowel  movement ?Baseline:  ?Goal status: INITIAL ? ?2.  Pt will be independent with advanced  HEP to maintain improvements made throughout therapy ? ?Baseline:  ?Goal status: INITIAL ? ?3.  Pt will report 50% less abdominal bloating and discomfort due to improved muscle tone throughout the core  ?Baseline:  ?Goal status: INITIAL ? ?4.  Pt will report 50% reduction of pain due to improvements in posture, strength, and muscle length ? ?Baseline:  ?Goal status: INITIAL ? ? ? ?PLAN: ?PT FREQUENCY: 1-2x/week ? ?PT DURATION: other: 16 (reducing frequency as able) ? ?PLANNED INTERVENTIONS: Therapeutic exercises, Therapeutic activity, Neuromuscular re-education, Balance training, Gait training, Patient/Family education, Joint mobilization, Dry Needling, Electrical stimulation, Cryotherapy, Moist heat, Taping, Biofeedback, and Manual therapy ? ?PLAN FOR NEXT SESSION: using biofeedback or rectal dilators ? ? ?Junious Silk, PT ?04/14/2021, 12:16 PM ? ?

## 2021-04-14 ENCOUNTER — Ambulatory Visit: Payer: 59 | Attending: Physician Assistant | Admitting: Physical Therapy

## 2021-04-14 ENCOUNTER — Encounter: Payer: Self-pay | Admitting: Physical Therapy

## 2021-04-14 DIAGNOSIS — R252 Cramp and spasm: Secondary | ICD-10-CM

## 2021-04-14 DIAGNOSIS — M6289 Other specified disorders of muscle: Secondary | ICD-10-CM | POA: Insufficient documentation

## 2021-04-14 DIAGNOSIS — R278 Other lack of coordination: Secondary | ICD-10-CM | POA: Diagnosis not present

## 2021-04-21 ENCOUNTER — Encounter: Payer: Self-pay | Admitting: Physical Medicine and Rehabilitation

## 2021-04-29 NOTE — Therapy (Signed)
?OUTPATIENT PHYSICAL THERAPY TREATMENT NOTE ? ? ?Patient Name: Brittany [name] ?MRN: [id number] ?DOB:02/10/1990, 31 y.o., female ?Today's Date: 05/01/2021 ? ?PCP: Jarold Motto, PA ?REFERRING PROVIDER: Jarold Motto, PA ? ?END OF SESSION:  ? PT End of Session - 05/01/21 0910   ? ? Visit Number 2   ? Date for PT Re-Evaluation 07/07/21   ? PT Start Time 1410   late  ? PT Stop Time 1448   ? PT Time Calculation (min) 38 min   ? Activity Tolerance Patient tolerated treatment well   ? Behavior During Therapy [facility] for tasks assessed/performed   ? ?  ?  ? ?  ? ? ?Past Medical History:  ?Diagnosis Date  ? Ankle injury   ? Anxiety   ? Eating disorder   ? Frequent headaches   ? ?Past Surgical History:  ?Procedure Laterality Date  ? ANKLE SURGERY  2018  ? HERNIA REPAIR    ? WISDOM TOOTH EXTRACTION    ? ?Patient Active Problem List  ? Diagnosis Date Noted  ? Positive ANA (antinuclear antibody) 10/19/2019  ? 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 Anorexia nervosa, restricting type, in full remission, moderate 02/16/2018  ? History of depression 02/16/2018  ? GAD (generalized anxiety disorder) 12/16/2017  ? Pelvic floor dysfunction 12/16/2017  ? Right hip pain 11/02/2017  ? Grade 2 ankle sprain, right, initial encounter 05/16/2016  ? 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  ? 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 Right ankle pain 03/03/2015  ? IBS (irritable bowel syndrome) 05/25/2009  ? ? ?REFERRING DIAG: M62.89 (ICD-10-CM) - Pelvic floor dysfunction ? ?THERAPY DIAG:  ?Other lack of coordination ? ?Cramp and spasm ? ?PERTINENT HISTORY: Chronic issue ? ?PRECAUTIONS: None ? ?SUBJECTIVE: Pt reports BM is the same.  I am going all the time and just a little comes out.  I got the second largest dilator and it is very uncomfortable and hard to get out sometimes, but I am using it every day ? ?PAIN:  ?Are you having pain? No ? ? ?OBJECTIVE: (objective measures completed at initial evaluation unless otherwise dated) ? ?LUMBAR  SPECIAL TESTS:  ?Not tested due to time ?  ?FUNCTIONAL TESTS:  ?Not tested due to time ?  ?GAIT: ?Comments: WFL ?  ?POSTURE:  ?Anterior pelvic tilt ?  ?LUMBARAROM/PROM ?  ? (Blank rows = not tested) ?  ?LE ROM: ?  ?Not tested due to time (Blank rows = not tested) ?  ?LE MMT: ?Time constraints ?  ?PELVIC MMT: ?  ?MMT   ?04/14/2021  ?Vaginal    ?Internal Anal Sphincter 3/5  ?External Anal Sphincter 2/5  ?Puborectalis 4/5  ?Diastasis Recti    ?(Blank rows = not tested) ?  ?      PALPATION: ?  General  tight lumbar paraspinals ?  ?              External Perineal Exam normal ?              ?              Internal Pelvic Floor TTP of sphincters and difficulty relaxing; can bulge partially; unable to palpate beyond puborectalis ?  ?TONE: ?high ?  ?PROLAPSE: ?None noted in supine ?  ?TODAY'S TREATMENT  ? ?Treatment:04/30/21 ?Exercises  ?Side bending seated with breathing ?Manual ?Coccyx mobs in quadruped and sitting with rocking and sidebending ?STM to lumbar and thoracic paraspinals ?Self Care ?Bowel retraining education - reducing the number of times she  attempts a BM and timing meals, spacing out fiber evenly as much as possible ? ?  ?  ?PATIENT EDUCATION:  ?Education details: tools to work on bowel movements                       ?Person educated: Patient ?Education method: Explanation ?Education comprehension: verbalized understanding ?  ?  ?HOME EXERCISE PROGRAM: ?Purchase choice of rectal neuro re-ed equipment to use in clinic and at home ?  ?ASSESSMENT: ?  ?CLINICAL IMPRESSION: ?Pt arrived late today and forgot to bring internal sensor for biofeedback.  Some time was spent discussing the dilator and how she seems to be hypersensitive to anything in the rectum which may be causing her non stop feeling or urge to have a BM.  Pt was educated in only going if it feels like a very strong urge instead of going constantly.  She was educated in bowel retraining where she sets specific times to go but she is not yet feeling  like that is possible for her.  Today's treatment included ?  ?  ?OBJECTIVE IMPAIRMENTS decreased coordination, decreased endurance, decreased strength, increased muscle spasms, impaired flexibility, impaired tone, and pain.  ?  ?ACTIVITY LIMITATIONS  toileting and interpersonal relationships .  ?  ?PERSONAL FACTORS Time since onset of injury/illness/exacerbation and connective tissue disorder  are also affecting patient's functional outcome.  ?  ?  ?REHAB POTENTIAL: Good ?  ?CLINICAL DECISION MAKING: Evolving/moderate complexity ?  ?EVALUATION COMPLEXITY: Moderate ?  ?  ?GOALS: ?Goals reviewed with patient? Yes ?  ?SHORT TERM GOALS: Target date: 05/13/2021 ?  ?Pt will have either dilator or internal sensor to use for treatment ?Baseline: ?Goal status: INITIAL ?  ?  ?LONG TERM GOALS: Target date: 08/04/21 ?  ?Pt will be able to have at least 3 days / week where she has a good bowel movement ?Baseline:  ?Goal status: INITIAL ?  ?2.  Pt will be independent with advanced HEP to maintain improvements made throughout therapy ?  ?Baseline:  ?Goal status: INITIAL ?  ?3.  Pt will report 50% less abdominal bloating and discomfort due to improved muscle tone throughout the core  ?Baseline:  ?Goal status: INITIAL ?  ?4.  Pt will report 50% reduction of pain due to improvements in posture, strength, and muscle length ?  ?Baseline:  ?Goal status: INITIAL ?  ?  ?  ?PLAN: ?PT FREQUENCY: 1x/week ?  ?PT DURATION: other: 16 (reducing frequency as able) ?  ?PLANNED INTERVENTIONS: Therapeutic exercises, Therapeutic activity, Neuromuscular re-education, Balance training, Gait training, Patient/Family education, Joint mobilization, Dry Needling, Electrical stimulation, Cryotherapy, Moist heat, Taping, Biofeedback, and Manual therapy ?  ?PLAN FOR NEXT SESSION: pelvic floor DN; using biofeedback or rectal dilators for down training ?  ? ?Junious Silk, PT ?05/01/2021, 9:23 AM ? ?  ? ?

## 2021-04-30 ENCOUNTER — Ambulatory Visit: Payer: 59 | Admitting: Physical Therapy

## 2021-04-30 DIAGNOSIS — R252 Cramp and spasm: Secondary | ICD-10-CM

## 2021-04-30 DIAGNOSIS — R278 Other lack of coordination: Secondary | ICD-10-CM

## 2021-04-30 DIAGNOSIS — M6289 Other specified disorders of muscle: Secondary | ICD-10-CM | POA: Diagnosis not present

## 2021-05-04 ENCOUNTER — Ambulatory Visit: Payer: 59 | Attending: Physician Assistant | Admitting: Physical Therapy

## 2021-05-04 DIAGNOSIS — R252 Cramp and spasm: Secondary | ICD-10-CM | POA: Insufficient documentation

## 2021-05-04 DIAGNOSIS — R278 Other lack of coordination: Secondary | ICD-10-CM | POA: Insufficient documentation

## 2021-05-04 NOTE — Patient Instructions (Signed)

## 2021-05-04 NOTE — Therapy (Signed)
[place] ?[facility] ?[address] ?[place], [place], [address] ?Phone: [phone]   Fax:  [phone] ? ?Patient Details  ?Name: Brittany [name] ?MRN: [id number] ?Date of Birth: 1990-09-15 ?Referring Provider:  Jarold Motto, PA ? ?Encounter Date: 05/04/2021 ? ?OUTPATIENT PHYSICAL THERAPY TREATMENT NOTE ? ? ?Patient Name: Brittany [name] ?MRN: [id number] ?DOB:06-03-1990, 31 y.o., female ?Today's Date: 05/04/2021 ? ?PCP: Jarold Motto, PA ?REFERRING PROVIDER: Jarold Motto, PA ? ?END OF SESSION:  ? PT End of Session - 05/04/21 1602   ? ? Visit Number 3   ? Date for PT Re-Evaluation 07/07/21   ? Authorization Type [employer]   ? Authorization - Visit Number 3   ? Authorization - Number of Visits 60   ? PT Start Time 1410   due to going to the bathroom  ? PT Stop Time 1440   ? PT Time Calculation (min) 30 min   ? Activity Tolerance Patient tolerated treatment well   ? Behavior During Therapy [facility] for tasks assessed/performed   ? ?  ?  ? ?  ? ? ?Past Medical History:  ?Diagnosis Date  ? Ankle injury   ? Anxiety   ? Eating disorder   ? Frequent headaches   ? ?Past Surgical History:  ?Procedure Laterality Date  ? ANKLE SURGERY  2018  ? HERNIA REPAIR    ? WISDOM TOOTH EXTRACTION    ? ?Patient Active Problem List  ? Diagnosis Date Noted  ? Positive ANA (antinuclear antibody) 10/19/2019  ? 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 Anorexia nervosa, restricting type, in full remission, moderate 02/16/2018  ? History of depression 02/16/2018  ? GAD (generalized anxiety disorder) 12/16/2017  ? Pelvic floor dysfunction 12/16/2017  ? Right hip pain 11/02/2017  ? Grade 2 ankle sprain, right, initial encounter 05/16/2016  ? 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  ? 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 Right ankle pain 03/03/2015  ? IBS (irritable bowel syndrome) 05/25/2009  ? ? ?REFERRING DIAG: M62.89 (ICD-10-CM) - Pelvic floor dysfunction ? ?THERAPY DIAG:  ?Other lack of  coordination ? ?Cramp and spasm ? ?PERTINENT HISTORY: Chronic issue ? ?PRECAUTIONS: None ? ?SUBJECTIVE: I am on the light purple anal dilator.  ? ?PAIN:  ?Are you having pain? Yes: NPRS scale: 1-9/10 ?Pain location: rectal and vaginal ?Pain description: deep, bulging, shooting ?Aggravating factors: trouble evacuating stool that is at the entrance;  ?Relieving factors: after having a bowel movement ? ? ?OBJECTIVE: (objective measures completed at initial evaluation unless otherwise dated) ?OBJECTIVE: (objective measures completed at initial evaluation unless otherwise dated) ? ?LUMBAR SPECIAL TESTS:  ?Not tested due to time ?  ?FUNCTIONAL TESTS:  ?Not tested due to time ?  ?GAIT: ?Comments: WFL ?  ?POSTURE:  ?Anterior pelvic tilt ?  ?LUMBARAROM/PROM ?  ? (Blank rows = not tested) ?  ?LE ROM: ?  ?Not tested due to time (Blank rows = not tested) ?  ?LE MMT: ?Time constraints ?  ?PELVIC MMT: ?  ?MMT   ?04/14/2021  ?Vaginal    ?Internal Anal Sphincter 3/5  ?External Anal Sphincter 2/5  ?Puborectalis 4/5  ?Diastasis Recti    ?(Blank rows = not tested) ?  ?      PALPATION: ?  General  tight lumbar paraspinals ?  ?              External Perineal Exam normal ?              ?  Internal Pelvic Floor TTP of sphincters and difficulty relaxing; can bulge partially; unable to palpate beyond puborectalis ?  ?TONE: ?high ?  ?PROLAPSE: ?None noted in supine ?  ?TODAY'S TREATMENT  ?05/04/2021 ?Manual: ?Soft tissue mobilization:to assess for dry needling; manual work to perineal body and superior transverse perineum for elongation ?Myofascial release:fascial release of the perineal body internally and externally, release of the superior transverse perineum ?Internal pelvic floor techniques: manual work to the perineal body and posterior fourchette.  ?DRY NEEDLING: ?Dry needling consent given? yes ?Educational handouts provided? yes ?Muscles treated: superior transverse perineum; perineal body ?Response from dry needling:  elongation of tissue and trigger point response  ? ?Self-care: ?Educated patient on what dry needling is, how the body responds and which muscles we are working on ?   ? ?Treatment:04/30/21 ?Exercises  ?Side bending seated with breathing ?Manual ?Coccyx mobs in quadruped and sitting with rocking and sidebending ?STM to lumbar and thoracic paraspinals ?Self Care ?Bowel retraining education - reducing the number of times she attempts a BM and timing meals, spacing out fiber evenly as much as possible ? ?  ?PATIENT EDUCATION: ?05/04/2021 ?Education details: educated patient on dry needling ?Person educated: Patient ?Education method: Explanation, handout ?Education comprehension: verbalized understanding ?  ?  ?HOME EXERCISE PROGRAM: ?Purchase choice of rectal neuro re-ed equipment to use in clinic and at home ?  ?ASSESSMENT: ?  ?CLINICAL IMPRESSION: ?Patient responded well with dry needling. She reports less pain and muscles felt relaxed afterwards. She was tender when the therapist placed her index finger into the vaginal canal. Patient had the urge to urinate prior to treatment but the urge went away after the dry needling.  Patient is on the light purple dilator and it is in the middle of the set she has so she has increased in size. Patient will benefit from skilled therapy to improve coordination and lengthening of the pelvic floor muscles to assist with evacuation of stool and reduce discomfort.  ?  ?OBJECTIVE IMPAIRMENTS decreased coordination, decreased endurance, decreased strength, increased muscle spasms, impaired flexibility, impaired tone, and pain.  ?  ?ACTIVITY LIMITATIONS  toileting and interpersonal relationships .  ?  ?PERSONAL FACTORS Time since onset of injury/illness/exacerbation and connective tissue disorder  are also affecting patient's functional outcome.  ?  ?  ?REHAB POTENTIAL: Good ?  ?CLINICAL DECISION MAKING: Evolving/moderate complexity ?  ?EVALUATION COMPLEXITY: Moderate ?  ?   ?GOALS: ?Goals reviewed with patient? Yes ?  ?SHORT TERM GOALS: Target date: 05/13/2021 ?  ?Pt will have either dilator or internal sensor to use for treatment ?Baseline: ?Goal status: INITIAL ?  ?  ?LONG TERM GOALS: Target date: 08/04/21 ?  ?Pt will be able to have at least 3 days / week where she has a good bowel movement ?Baseline:  ?Goal status: INITIAL ?  ?2.  Pt will be independent with advanced HEP to maintain improvements made throughout therapy ?  ?Baseline:  ?Goal status: INITIAL ?  ?3.  Pt will report 50% less abdominal bloating and discomfort due to improved muscle tone throughout the core  ?Baseline:  ?Goal status: INITIAL ?  ?4.  Pt will report 50% reduction of pain due to improvements in posture, strength, and muscle length ?  ?Baseline:  ?Goal status: INITIAL ?  ?  ?  ?PLAN: ?PT FREQUENCY: 1x/week ?  ?PT DURATION: other: 16 (reducing frequency as able) ?  ?PLANNED INTERVENTIONS: Therapeutic exercises, Therapeutic activity, Neuromuscular re-education, Balance training, Gait training, Patient/Family education, Joint mobilization, Dry  Needling, Electrical stimulation, Cryotherapy, Moist heat, Taping, Biofeedback, and Manual therapy ?  ?PLAN FOR NEXT SESSION: pelvic floor DN as needed; using biofeedback or rectal dilators for down training; manual work to the pelvic floor; sitting on yoga block to elongate the pelvic floor; write renewal to include 2 times per week ? ? ? ? ?Eulis Foster, PT ?05/04/21 4:03 PM ? ? ?[place] ?[facility] ?[address] ?[place], [place], [address] ?Phone: [phone]   Fax:  [phone] ?

## 2021-05-05 ENCOUNTER — Ambulatory Visit: Payer: 59 | Admitting: Physical Therapy

## 2021-05-05 ENCOUNTER — Encounter: Payer: Self-pay | Admitting: Physical Therapy

## 2021-05-05 DIAGNOSIS — R278 Other lack of coordination: Secondary | ICD-10-CM | POA: Diagnosis not present

## 2021-05-05 DIAGNOSIS — R252 Cramp and spasm: Secondary | ICD-10-CM

## 2021-05-05 NOTE — Therapy (Signed)
?OUTPATIENT PHYSICAL THERAPY TREATMENT NOTE ? ? ?Patient Name: Brittany [name] ?MRN: [id number] ?DOB:1990/01/18, 31 y.o., female ?Today's Date: 05/05/2021 ? ?PCP: Inda Coke, PA ?REFERRING PROVIDER: Inda Coke, PA ? ?END OF SESSION:  ? PT End of Session - 05/05/21 1355   ? ? Visit Number 4   ? Date for PT Re-Evaluation 07/07/21   ? Authorization Type UHC   ? Authorization - Visit Number 4   ? Authorization - Number of Visits 60   ? PT Start Time 1152   ? PT Stop Time 1233   ? PT Time Calculation (min) 41 min   ? Activity Tolerance Patient tolerated treatment well   ? Behavior During Therapy [facility] for tasks assessed/performed   ? ?  ?  ? ?  ? ? ? ?Past Medical History:  ?Diagnosis Date  ? Ankle injury   ? Anxiety   ? Eating disorder   ? Frequent headaches   ? ?Past Surgical History:  ?Procedure Laterality Date  ? ANKLE SURGERY  2018  ? HERNIA REPAIR    ? WISDOM TOOTH EXTRACTION    ? ?Patient Active Problem List  ? Diagnosis Date Noted  ? Positive ANA (antinuclear antibody) 10/19/2019  ? 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 Anorexia nervosa, restricting type, in full remission, moderate 02/16/2018  ? History of depression 02/16/2018  ? GAD (generalized anxiety disorder) 12/16/2017  ? Pelvic floor dysfunction 12/16/2017  ? Right hip pain 11/02/2017  ? Grade 2 ankle sprain, right, initial encounter 05/16/2016  ? 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  ? 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 Right ankle pain 03/03/2015  ? IBS (irritable bowel syndrome) 05/25/2009  ? ? ?REFERRING DIAG: M62.89 (ICD-10-CM) - Pelvic floor dysfunction ? ?THERAPY DIAG:  ?Other lack of coordination ? ?Cramp and spasm ? ?PERTINENT HISTORY: Chronic issue ? ?PRECAUTIONS: None ? ?SUBJECTIVE: Pt states everything is the same but there might have been more relaxation in the pelvic floor after the dry needling. ? ?PAIN:  ?Are you having pain? No ? ? ?OBJECTIVE: (objective measures completed at initial evaluation unless otherwise  dated) ? ?LUMBAR SPECIAL TESTS:  ?Not tested due to time ?  ?FUNCTIONAL TESTS:  ?Not tested due to time ?  ?GAIT: ?Comments: WFL ?  ?POSTURE:  ?Anterior pelvic tilt ?  ?LUMBARAROM/PROM ?  ? (Blank rows = not tested) ?  ?LE ROM: ?  ?Not tested due to time (Blank rows = not tested) ?  ?LE MMT: ?Time constraints ?  ?PELVIC MMT: ?  ?MMT   ?04/14/2021  ?Vaginal    ?Internal Anal Sphincter 3/5  ?External Anal Sphincter 2/5  ?Puborectalis 4/5  ?Diastasis Recti    ?(Blank rows = not tested) ?  ?      PALPATION: ?  General  tight lumbar paraspinals ?  ?              External Perineal Exam normal ?              ?              Internal Pelvic Floor TTP of sphincters and difficulty relaxing; can bulge partially; unable to palpate beyond puborectalis ?  ?TONE: ?high ?  ?PROLAPSE: ?None noted in supine ?  ?TODAY'S TREATMENT  ?Treatment:05/05/21 ?Self Care ?Bowel retraining education - mantra is going to be "I am safe" so that she can spend less time in the bathroom ?Discussed ways to use the rectal dilator or the probe sensor to work on  relaxing while on the toilet ? ?Treatment:04/30/21 ?Exercises  ?Side bending seated with breathing ?Manual ?Coccyx mobs in quadruped and sitting with rocking and sidebending ?STM to lumbar and thoracic paraspinals ?Self Care ?Bowel retraining education - reducing the number of times she attempts a BM and timing meals, spacing out fiber evenly as much as possible ? ?  ?  ?PATIENT EDUCATION:  ?Education details: tools to work on bowel movements                       ?Person educated: Patient ?Education method: Explanation ?Education comprehension: verbalized understanding ?  ?  ?HOME EXERCISE PROGRAM: ?Purchase choice of rectal neuro re-ed equipment to use in clinic and at home ?  ?ASSESSMENT: ?  ?CLINICAL IMPRESSION: ?Pt had sensor for internal EMG today but we were unable to use due to not having reference electrode.  Pt was given more information on bowel retraining.  Pt admits to having a lot of  fear around trying this method, so this session was spent figuring out how she can more gradually implement some better toileting habits that she will feel comfortable with.  Pt is currently spending extreme amounts of time in the bathroom and not evacuating during much of this time.  Pt will benefit from skilled PT to continue to work on improved muscle tone, coordination, and sensation. ?  ?  ?OBJECTIVE IMPAIRMENTS decreased coordination, decreased endurance, decreased strength, increased muscle spasms, impaired flexibility, impaired tone, and pain.  ?  ?ACTIVITY LIMITATIONS  toileting and interpersonal relationships .  ?  ?PERSONAL FACTORS Time since onset of injury/illness/exacerbation and connective tissue disorder  are also affecting patient's functional outcome.  ?  ?  ?REHAB POTENTIAL: Good ?  ?CLINICAL DECISION MAKING: Evolving/moderate complexity ?  ?EVALUATION COMPLEXITY: Moderate ?  ?  ?GOALS: ?Goals reviewed with patient? Yes ?  ?SHORT TERM GOALS: Target date: 05/13/2021 ?  ?Pt will have either dilator or internal sensor to use for treatment ?Baseline: has both ?Goal status: MET ?  ?  ?LONG TERM GOALS: Target date: 08/04/21 ?  ?Pt will be able to have at least 3 days / week where she has a good bowel movement ?Baseline:  ?Goal status: INITIAL ?  ?2.  Pt will be independent with advanced HEP to maintain improvements made throughout therapy ?  ?Baseline:  ?Goal status: INITIAL ?  ?3.  Pt will report 50% less abdominal bloating and discomfort due to improved muscle tone throughout the core  ?Baseline:  ?Goal status: INITIAL ?  ?4.  Pt will report 50% reduction of pain due to improvements in posture, strength, and muscle length ?  ?Baseline:  ?Goal status: INITIAL ?  ?  ?  ?PLAN: ?PT FREQUENCY: 1x/week ?  ?PT DURATION: other: 16 (reducing frequency as able) ?  ?PLANNED INTERVENTIONS: Therapeutic exercises, Therapeutic activity, Neuromuscular re-education, Balance training, Gait training, Patient/Family  education, Joint mobilization, Dry Needling, Electrical stimulation, Cryotherapy, Moist heat, Taping, Biofeedback, and Manual therapy ?  ?PLAN FOR NEXT SESSION: pelvic floor DN; using biofeedback or rectal dilators for down training, f/u on more trust ?  ? ?Jule Ser, PT ?05/05/2021, 1:56 PM ? ?  ? ?

## 2021-05-05 NOTE — Addendum Note (Signed)
Addended by: Beatris Si on: 05/05/2021 08:19 AM ? ? Modules accepted: Orders ? ?

## 2021-05-11 ENCOUNTER — Ambulatory Visit: Payer: 59

## 2021-05-11 DIAGNOSIS — R252 Cramp and spasm: Secondary | ICD-10-CM

## 2021-05-11 DIAGNOSIS — R278 Other lack of coordination: Secondary | ICD-10-CM | POA: Diagnosis not present

## 2021-05-11 NOTE — Patient Instructions (Signed)
Bladder/bowel retraining: ? ?8-12 grams of fiber with each meal ?After each meal, go attempt to have a bowel movement within 5 minutes and don?t sit on the ?toilet for more than 5 minutes ?Drink 4-8oz an hour ?ONLY WATER ?Stop water intake 3 hours before bed ?Try to go at least 2-3 hours between trips to the bathroom - go whether you need to or not. ? ?For two weeks. ? ?

## 2021-05-11 NOTE — Therapy (Signed)
?OUTPATIENT PHYSICAL THERAPY TREATMENT NOTE ? ? ?Patient Name: Brittany [name] ?MRN: [id number] ?DOB:06/12/1990, 31 y.o., female ?Today's Date: 05/11/2021 ? ?PCP: Inda Coke, PA ?REFERRING PROVIDER: Inda Coke, PA ? ?END OF SESSION:  ? PT End of Session - 05/11/21 1534   ? ? Visit Number 5   ? Date for PT Re-Evaluation 07/07/21   ? Authorization Type UHC   ? Authorization - Visit Number 5   ? Authorization - Number of Visits 60   ? PT Start Time 1533   ? PT Stop Time 1612   ? PT Time Calculation (min) 39 min   ? Activity Tolerance Patient tolerated treatment well   ? Behavior During Therapy [facility] for tasks assessed/performed   ? ?  ?  ? ?  ? ? ? ?Past Medical History:  ?Diagnosis Date  ? Ankle injury   ? Anxiety   ? Eating disorder   ? Frequent headaches   ? ?Past Surgical History:  ?Procedure Laterality Date  ? ANKLE SURGERY  2018  ? HERNIA REPAIR    ? WISDOM TOOTH EXTRACTION    ? ?Patient Active Problem List  ? Diagnosis Date Noted  ? Positive ANA (antinuclear antibody) 10/19/2019  ? 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 Anorexia nervosa, restricting type, in full remission, moderate 02/16/2018  ? History of depression 02/16/2018  ? GAD (generalized anxiety disorder) 12/16/2017  ? Pelvic floor dysfunction 12/16/2017  ? Right hip pain 11/02/2017  ? Grade 2 ankle sprain, right, initial encounter 05/16/2016  ? 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  ? 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 Right ankle pain 03/03/2015  ? IBS (irritable bowel syndrome) 05/25/2009  ? ? ?REFERRING DIAG: M62.89 (ICD-10-CM) - Pelvic floor dysfunction ? ?THERAPY DIAG:  ?Other lack of coordination ? ?Cramp and spasm ? ?PERTINENT HISTORY: Chronic issue ? ?PRECAUTIONS: None ? ?SUBJECTIVE: Pt states that she is feeling a little stressed. She states that she has not really had any improvement since last treatment.  ? ?PAIN:  ?Are you having pain? No ? ? ?OBJECTIVE: (objective measures completed at initial evaluation unless  otherwise dated) ? ?LUMBAR SPECIAL TESTS:  ?Not tested due to time ?  ?FUNCTIONAL TESTS:  ?Not tested due to time ?  ?GAIT: ?Comments: WFL ?  ?POSTURE:  ?Anterior pelvic tilt ?  ?LUMBARAROM/PROM ?  ? (Blank rows = not tested) ?  ?LE ROM: ?  ?Not tested due to time (Blank rows = not tested) ?  ?LE MMT: ?Time constraints ?  ?PELVIC MMT: ?  ?MMT   ?04/14/2021  ?Vaginal    ?Internal Anal Sphincter 3/5  ?External Anal Sphincter 2/5  ?Puborectalis 4/5  ?Diastasis Recti    ?(Blank rows = not tested) ?  ?      PALPATION: ?  General  tight lumbar paraspinals ?  ?              External Perineal Exam normal ?              ?              Internal Pelvic Floor TTP of sphincters and difficulty relaxing; can bulge partially; unable to palpate beyond puborectalis ?  ?TONE: ?high ?  ?PROLAPSE: ?None noted in supine ?  ?TODAY'S TREATMENT  ?Treatment: 05/11/21 ?Manual: ?Soft tissue mobilization: ?Lower Bil sacral/coccygeal borders ?Bil obturator internus ?Bil glutes ?Dry needling: ?Lower Bil sacral/coccygeal borders ?Bil obturator internus ?Bil glutes ?Self-care: ?Started discussing strict bowel retraining - did not finish ?Positive  mantra ?Mindfulness ? ? ?Treatment:05/05/21 ?Self Care ?Bowel retraining education - mantra is going to be "I am safe" so that she can spend less time in the bathroom ?Discussed ways to use the rectal dilator or the probe sensor to work on relaxing while on the toilet ? ?Treatment:04/30/21 ?Exercises  ?Side bending seated with breathing ?Manual ?Coccyx mobs in quadruped and sitting with rocking and sidebending ?STM to lumbar and thoracic paraspinals ?Self Care ?Bowel retraining education - reducing the number of times she attempts a BM and timing meals, spacing out fiber evenly as much as possible ? ?  ?  ?PATIENT EDUCATION:  ?Education details: started strict bowel/bladder retraining, but did not finish this education              ?Person educated: Patient ?Education method: Explanation ?Education  comprehension: verbalized understanding ?  ?  ?HOME EXERCISE PROGRAM: ?Purchase choice of rectal neuro re-ed equipment to use in clinic and at home ?  ?ASSESSMENT: ?  ?CLINICAL IMPRESSION: ?DN performed to Bil sacral/coccygeal borders, Bil glutes, and Bil obturator internus. She had very painful response, but significant release of soft tissue restriction, most notable in Lt obturator internus and Rt glutes. We started discussing bowel retraining per patient request, but she became anxious about timing of bowel movements and restricting this habit in any way; therefore, held off for this treatment session.Believe following DN techniques with coccygeal mobilizations and traction would be great adjunct treatment, but did not have time to perform this session - possibly consider for next session. Encouraged pt to continue to working on mindfulness and positive mantra/self-talk. Pt will benefit from skilled PT to continue to work on improved muscle tone, coordination, and sensation. ?  ?  ?OBJECTIVE IMPAIRMENTS decreased coordination, decreased endurance, decreased strength, increased muscle spasms, impaired flexibility, impaired tone, and pain.  ?  ?ACTIVITY LIMITATIONS  toileting and interpersonal relationships .  ?  ?PERSONAL FACTORS Time since onset of injury/illness/exacerbation and connective tissue disorder  are also affecting patient's functional outcome.  ?  ?  ?REHAB POTENTIAL: Good ?  ?CLINICAL DECISION MAKING: Evolving/moderate complexity ?  ?EVALUATION COMPLEXITY: Moderate ?  ?  ?GOALS: ?Goals reviewed with patient? Yes ?  ?SHORT TERM GOALS: Target date: 05/13/2021 ?  ?Pt will have either dilator or internal sensor to use for treatment ?Baseline: has both ?Goal status: MET ?  ?  ?LONG TERM GOALS: Target date: 08/04/21 ?  ?Pt will be able to have at least 3 days / week where she has a good bowel movement ?Baseline:  ?Goal status: INITIAL ?  ?2.  Pt will be independent with advanced HEP to maintain improvements  made throughout therapy ?  ?Baseline:  ?Goal status: INITIAL ?  ?3.  Pt will report 50% less abdominal bloating and discomfort due to improved muscle tone throughout the core  ?Baseline:  ?Goal status: INITIAL ?  ?4.  Pt will report 50% reduction of pain due to improvements in posture, strength, and muscle length ?  ?Baseline:  ?Goal status: INITIAL ?  ?  ?  ?PLAN: ?PT FREQUENCY: 1x/week ?  ?PT DURATION: other: 16 (reducing frequency as able) ?  ?PLANNED INTERVENTIONS: Therapeutic exercises, Therapeutic activity, Neuromuscular re-education, Balance training, Gait training, Patient/Family education, Joint mobilization, Dry Needling, Electrical stimulation, Cryotherapy, Moist heat, Taping, Biofeedback, and Manual therapy ?  ?PLAN FOR NEXT SESSION: consider rectal release of coccyx to facilitate tissue changes started with DN today.  ?  ?Heather Roberts, PT, DPT05/08/235:02 PM ? ? ?  ? ?

## 2021-05-11 NOTE — Therapy (Signed)
?OUTPATIENT PHYSICAL THERAPY TREATMENT NOTE ? ? ?Patient Name: Brittany [name] ?MRN: [id number] ?DOB:[birth year], 31 y.o., female ?Today's Date: 05/13/2021 ? ?PCP: Inda Coke, PA ?REFERRING PROVIDER: Inda Coke, PA ? ?END OF SESSION:  ? PT End of Session - 05/13/21 1024   ? ? Visit Number 6   ? Date for PT Re-Evaluation 07/07/21   ? Authorization Type UHC   ? Authorization - Number of Visits 60   ? PT Start Time 1155   late  ? PT Stop Time 1230   ? PT Time Calculation (min) 35 min   ? Activity Tolerance Patient tolerated treatment well   ? Behavior During Therapy [facility] for tasks assessed/performed   ? ?  ?  ? ?  ? ? ? ? ?Past Medical History:  ?Diagnosis Date  ? Ankle injury   ? Anxiety   ? Eating disorder   ? Frequent headaches   ? ?Past Surgical History:  ?Procedure Laterality Date  ? ANKLE SURGERY  2018  ? HERNIA REPAIR    ? WISDOM TOOTH EXTRACTION    ? ?Patient Active Problem List  ? Diagnosis Date Noted  ? Positive ANA (antinuclear antibody) 10/19/2019  ? 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 Anorexia nervosa, restricting type, in full remission, moderate 02/16/2018  ? History of depression 02/16/2018  ? GAD (generalized anxiety disorder) 12/16/2017  ? Pelvic floor dysfunction 12/16/2017  ? Right hip pain 11/02/2017  ? Grade 2 ankle sprain, right, initial encounter 05/16/2016  ? 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  ? 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 Right ankle pain 03/03/2015  ? IBS (irritable bowel syndrome) 05/25/2009  ? ? ?REFERRING DIAG: M62.89 (ICD-10-CM) - Pelvic floor dysfunction ? ?THERAPY DIAG:  ?Other lack of coordination ? ?Cramp and spasm ? ?PERTINENT HISTORY: Chronic issue ? ?PRECAUTIONS: None ? ?SUBJECTIVE: Pt states everything is the same but there might have been more relaxation in the pelvic floor after the dry needling. ? ?PAIN:  ?Are you having pain? No ? ? ?OBJECTIVE: (objective measures completed at initial evaluation unless otherwise dated) ? ?LUMBAR SPECIAL  TESTS:  ?Not tested due to time ?  ?FUNCTIONAL TESTS:  ?Not tested due to time ?  ?GAIT: ?Comments: WFL ?  ?POSTURE:  ?Anterior pelvic tilt ?  ?LUMBARAROM/PROM ?  ? (Blank rows = not tested) ?  ?LE ROM: ?  ?Not tested due to time (Blank rows = not tested) ?  ?LE MMT: ?Time constraints ?  ?PELVIC MMT: ?  ?MMT   ?04/14/2021  ?Vaginal    ?Internal Anal Sphincter 3/5  ?External Anal Sphincter 2/5  ?Puborectalis 4/5  ?Diastasis Recti    ?(Blank rows = not tested) ?  ?      PALPATION: ?  General  tight lumbar paraspinals ?  ?              External Perineal Exam normal ?              ?              Internal Pelvic Floor TTP of sphincters and difficulty relaxing; can bulge partially; unable to palpate beyond puborectalis ?  ?TONE: ?high ?  ?PROLAPSE: ?None noted in supine ?  ?TODAY'S TREATMENT  ?Treatment:05/12/21 ?Neuro re-ed  ?Side bending seated with breathing and bulging ?Breathing and cues to relax pelvic floor done with both internal and external soft tissue work ?Manual ?Coccyx mobs in quadruped and sitting with rocking and sidebending ?STM to lumbar and thoracic paraspinals ?  Patient confirms identification and approves physical therapist to perform internal soft tissue work : rectal sphincters, puborectalis, coccyx mob ? ?Treatment:05/05/21 ?Self Care ?Bowel retraining education - mantra is going to be "I am safe" so that she can spend less time in the bathroom ?Discussed ways to use the rectal dilator or the probe sensor to work on relaxing while on the toilet ? ?Treatment:04/30/21 ?Exercises  ?Side bending seated with breathing ?Manual ?Coccyx mobs in quadruped and sitting with rocking and sidebending ?STM to lumbar and thoracic paraspinals ?Self Care ?Bowel retraining education - reducing the number of times she attempts a BM and timing meals, spacing out fiber evenly as much as possible ? ?  ?  ?PATIENT EDUCATION:  ?Education details: tools to work on bowel movements                       ?Person educated:  Patient ?Education method: Explanation ?Education comprehension: verbalized understanding ?  ?  ?HOME EXERCISE PROGRAM: ?Purchase choice of rectal neuro re-ed equipment to use in clinic and at home ?  ?ASSESSMENT: ?  ?CLINICAL IMPRESSION: ?Pt reports a good response from dry needling yesterday.  Today's session was follow up with internal soft tissue and coccyx mob release in order to work more into tissues since she was feeling more relaxed after the dry needling.  Pt tolerated this alright without increased pain after session.  She was not able to tolerate more than medium pressure to the posterior pelvic floor soft tissues.  Pt did get a release.  Attempted contract/relax but she ended up with increased muscle tone.  Pt will benefit from skilled PT to continue to work on coordination and improved bowel movements.  ?  ?  ?OBJECTIVE IMPAIRMENTS decreased coordination, decreased endurance, decreased strength, increased muscle spasms, impaired flexibility, impaired tone, and pain.  ?  ?ACTIVITY LIMITATIONS  toileting and interpersonal relationships .  ?  ?PERSONAL FACTORS Time since onset of injury/illness/exacerbation and connective tissue disorder  are also affecting patient's functional outcome.  ?  ?  ?REHAB POTENTIAL: Good ?  ?CLINICAL DECISION MAKING: Evolving/moderate complexity ?  ?EVALUATION COMPLEXITY: Moderate ?  ?  ?GOALS: ?Goals reviewed with patient? Yes ?  ?SHORT TERM GOALS: Target date: 05/13/2021 ?  ?Pt will have either dilator or internal sensor to use for treatment ?Baseline: has both ?Goal status: MET ?  ?  ?LONG TERM GOALS: Target date: 08/04/21 ?  ?Pt will be able to have at least 3 days / week where she has a good bowel movement ?Baseline:  ?Goal status: INITIAL ?  ?2.  Pt will be independent with advanced HEP to maintain improvements made throughout therapy ?  ?Baseline:  ?Goal status: INITIAL ?  ?3.  Pt will report 50% less abdominal bloating and discomfort due to improved muscle tone throughout  the core  ?Baseline:  ?Goal status: INITIAL ?  ?4.  Pt will report 50% reduction of pain due to improvements in posture, strength, and muscle length ?  ?Baseline:  ?Goal status: INITIAL ?  ?  ?  ?PLAN: ?PT FREQUENCY: 1x/week ?  ?PT DURATION: other: 16 (reducing frequency as able) ?  ?PLANNED INTERVENTIONS: Therapeutic exercises, Therapeutic activity, Neuromuscular re-education, Balance training, Gait training, Patient/Family education, Joint mobilization, Dry Needling, Electrical stimulation, Cryotherapy, Moist heat, Taping, Biofeedback, and Manual therapy ?  ?PLAN FOR NEXT SESSION: pelvic floor DN; using biofeedback or rectal dilators for down training, f/u on more trusting the body ?  ? ?Camillo Flaming  Adrion Menz, PT ?05/13/2021, 10:25 AM ? ?  ? ?

## 2021-05-12 ENCOUNTER — Ambulatory Visit: Payer: 59 | Admitting: Physical Therapy

## 2021-05-12 DIAGNOSIS — R278 Other lack of coordination: Secondary | ICD-10-CM | POA: Diagnosis not present

## 2021-05-12 DIAGNOSIS — R252 Cramp and spasm: Secondary | ICD-10-CM

## 2021-05-13 ENCOUNTER — Encounter: Payer: Self-pay | Admitting: Physical Therapy

## 2021-05-19 ENCOUNTER — Encounter: Payer: 59 | Admitting: Physical Therapy

## 2021-05-19 NOTE — Therapy (Deleted)
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  PERTINENT HISTORY: Chronic issue  PRECAUTIONS: None  SUBJECTIVE: Pt states everything is the same but there might have been more relaxation in the pelvic floor after the dry needling.  PAIN:  Are you having pain? No   OBJECTIVE: (objective measures completed at initial evaluation unless otherwise dated)  LUMBAR SPECIAL TESTS:  Not tested due to time   FUNCTIONAL TESTS:  Not tested due to time   GAIT: Comments: WFL   POSTURE:  Anterior pelvic tilt   LUMBARAROM/PROM    (Blank rows = not tested)   LE ROM:   Not tested due to time (Blank rows = not tested)   LE MMT: Time constraints   PELVIC MMT:   MMT   04/14/2021  Vaginal    Internal Anal Sphincter 3/5  External Anal Sphincter 2/5  Puborectalis  4/5  Diastasis Recti    (Blank rows = not tested)         PALPATION:   General  tight lumbar paraspinals                 External Perineal Exam normal                             Internal Pelvic Floor TTP of sphincters and difficulty relaxing; can bulge partially; unable to palpate beyond puborectalis   TONE: high   PROLAPSE: None noted in supine   TODAY'S TREATMENT  Treatment:05/12/21 Neuro re-ed  Side bending seated with breathing and bulging Breathing and cues to relax pelvic floor done with both internal and external soft tissue work Manual Coccyx mobs in quadruped and sitting with rocking and sidebending STM to lumbar and thoracic paraspinals Patient confirms identification and approves physical therapist to perform internal soft tissue work : rectal sphincters, puborectalis, coccyx mob  Treatment:05/05/21 Self Care Bowel retraining education - mantra is going to be "I am safe" so that she can spend less time in the bathroom Discussed ways to use the rectal dilator or the probe sensor to work on relaxing while on the toilet  Treatment:04/30/21 Exercises  Side bending seated with breathing Manual Coccyx mobs in quadruped and sitting with rocking and sidebending STM to lumbar and thoracic paraspinals Self Care Bowel retraining education -  reducing the number of times she attempts a BM and timing meals, spacing out fiber evenly as much as possible      PATIENT EDUCATION:  Education details: tools to work on bowel movements                       Person educated: Patient Education method: Explanation Education comprehension: verbalized understanding     HOME EXERCISE PROGRAM: Purchase choice of rectal neuro re-ed equipment to use in clinic and at home   ASSESSMENT:   CLINICAL IMPRESSION: Pt reports a good response from dry needling yesterday.  Today's session was follow up with internal soft tissue and coccyx mob release in order to work more into tissues since she  was feeling more relaxed after the dry needling.  Pt tolerated this alright without increased pain after session.  She was not able to tolerate more than medium pressure to the posterior pelvic floor soft tissues.  Pt did get a release.  Attempted contract/relax but she ended up with increased muscle tone.  Pt will benefit from skilled PT to continue to work on coordination and improved bowel movements.      OBJECTIVE IMPAIRMENTS decreased coordination, decreased endurance, decreased strength, increased muscle spasms, impaired flexibility, impaired tone, and pain.    ACTIVITY LIMITATIONS  toileting and interpersonal relationships .    PERSONAL FACTORS Time since onset of injury/illness/exacerbation and connective tissue disorder  are also affecting patient's functional outcome.      REHAB POTENTIAL: Good   CLINICAL DECISION MAKING: Evolving/moderate complexity   EVALUATION COMPLEXITY: Moderate     GOALS: Goals reviewed with patient? Yes   SHORT TERM GOALS: Target date: 05/13/2021   Pt will have either dilator or internal sensor to use for treatment Baseline: has both Goal status: MET     LONG TERM GOALS: Target date: 08/04/21   Pt will be able to have at least 3 days / week where she has a good bowel movement Baseline:  Goal status: INITIAL   2.  Pt will be independent with advanced HEP to maintain improvements made throughout therapy   Baseline:  Goal status: INITIAL   3.  Pt will report 50% less abdominal bloating and discomfort due to improved muscle tone throughout the core  Baseline:  Goal status: INITIAL   4.  Pt will report 50% reduction of pain due to improvements in posture, strength, and muscle length   Baseline:  Goal status: INITIAL       PLAN: PT FREQUENCY: 1x/week   PT DURATION: other: 16 (reducing frequency as able)   PLANNED INTERVENTIONS: Therapeutic exercises, Therapeutic activity, Neuromuscular re-education, Balance training, Gait training,  Patient/Family education, Joint mobilization, Dry Needling, Electrical stimulation, Cryotherapy, Moist heat, Taping, Biofeedback, and Manual therapy   PLAN FOR NEXT SESSION: pelvic floor DN; using biofeedback or rectal dilators for down training, f/u on more trusting the body    [employer], PT 05/19/2021, 7:55 AM

## 2021-05-21 ENCOUNTER — Ambulatory Visit: Payer: 59

## 2021-05-21 DIAGNOSIS — R252 Cramp and spasm: Secondary | ICD-10-CM

## 2021-05-21 DIAGNOSIS — R278 Other lack of coordination: Secondary | ICD-10-CM | POA: Diagnosis not present

## 2021-05-21 NOTE — Therapy (Signed)
OUTPATIENT PHYSICAL THERAPY TREATMENT NOTE   Patient Name: Brittany [name] MRN: [id number] DOB:[birth year], 31 y.o., female Today's Date: 05/21/2021  PCP: Inda Coke, PA REFERRING PROVIDER: Inda Coke, PA  END OF SESSION:   PT End of Session - 05/21/21 1233     Visit Number 7    Date for PT Re-Evaluation 07/07/21    Authorization Type [employer]    Authorization - Visit Number 6    Authorization - Number of Visits 60    PT Start Time 1230    PT Stop Time 1310    PT Time Calculation (min) 40 min    Activity Tolerance Patient tolerated treatment well    Behavior During Therapy WFL for tasks assessed/performed               Past Medical History:  Diagnosis Date   Ankle injury    Anxiety    Eating disorder    Frequent headaches    Past Surgical History:  Procedure Laterality Date   ANKLE SURGERY  2018   HERNIA REPAIR     WISDOM TOOTH EXTRACTION     Patient Active Problem List   Diagnosis Date Noted   Positive ANA (antinuclear antibody) 10/19/2019   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   Anorexia nervosa, restricting type, in full remission, moderate 02/16/2018   History of depression 02/16/2018   GAD (generalized anxiety disorder) 12/16/2017   Pelvic floor dysfunction 12/16/2017   Right hip pain 11/02/2017   Grade 2 ankle sprain, right, initial encounter 05/16/2016   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 She has appointment with MD tomorrow to see if it is helpful, but feels like she is unable to tell because of increased tightness/pain. She's having  trouble doing some of the PT prior to going to the bathroom due to pain. She is noticing some improvement in surgical pain in the last day.   PAIN:  Are you having pain?   OBJECTIVE: (objective measures completed at initial evaluation unless otherwise dated)  LUMBAR SPECIAL TESTS:  Not tested due to time   FUNCTIONAL TESTS:  Not tested due to time   GAIT: Comments: WFL   POSTURE:  Anterior pelvic tilt   LUMBARAROM/PROM    (Blank rows = not tested)   LE ROM:   Not tested due to time (Blank rows = not tested)   LE MMT: Time constraints   PELVIC MMT:   MMT   04/14/2021  Vaginal    Internal Anal Sphincter 3/5  External Anal Sphincter 2/5  Puborectalis 4/5  Diastasis Recti    (Blank rows = not tested)         PALPATION:   General  tight lumbar paraspinals                 External Perineal Exam normal                             Internal Pelvic Floor TTP of sphincters and difficulty relaxing; can bulge partially; unable to palpate beyond puborectalis   TONE: high  PROLAPSE: None noted in supine   TODAY'S TREATMENT  Treatment:05/21/21 Manual DN to Bil obturator internus Internal vaginal release of Rt obturator internus/levator ani/superficial pelvic foor  Treatment:05/12/21 Neuro re-ed  Side bending seated with breathing and bulging Breathing and cues to relax pelvic floor done with both internal and external soft tissue work Manual Coccyx mobs in quadruped and sitting with rocking and sidebending STM to lumbar and thoracic paraspinals Patient confirms identification and approves physical therapist to perform internal soft tissue work : rectal sphincters, puborectalis, coccyx mob  Treatment:05/05/21 Self Care Bowel retraining education - mantra is going to be "I am safe" so that she can spend less time in the bathroom Discussed ways to use the rectal dilator or the probe sensor to work on relaxing while on the toilet    PATIENT EDUCATION:  Education  details: normal recovery time from Inter-stim placement            Person educated: Patient Education method: Explanation Education comprehension: verbalized understanding     HOME EXERCISE PROGRAM: Purchase choice of rectal neuro re-ed equipment to use in clinic and at home   ASSESSMENT:   CLINICAL IMPRESSION: Pt very sore today from surgery earlier this week. We discussed that current pain levels do not seem abnormal with procedure and it is important to allow body time to heal and adjust. We attempted some DN to Bil obturator internus, but due to positioning limitations, no further DN performed today. Internal pelvic floor release performed vaginally with significant Rt muscle spasms. She tolerated release techniques well with improved resting tone of Rt pelvic floor and decrease in spasms. She reported feeling tender after treatment, but possibly small amount of improvement in pain. Pt will benefit from skilled PT to continue to work on coordination and improved bowel movements.      OBJECTIVE IMPAIRMENTS decreased coordination, decreased endurance, decreased strength, increased muscle spasms, impaired flexibility, impaired tone, and pain.    ACTIVITY LIMITATIONS  toileting and interpersonal relationships .    PERSONAL FACTORS Time since onset of injury/illness/exacerbation and connective tissue disorder  are also affecting patient's functional outcome.      REHAB POTENTIAL: Good   CLINICAL DECISION MAKING: Evolving/moderate complexity   EVALUATION COMPLEXITY: Moderate     GOALS: Goals reviewed with patient? Yes   SHORT TERM GOALS: Target date: 05/13/2021   Pt will have either dilator or internal sensor to use for treatment Baseline: has both Goal status: MET     LONG TERM GOALS: Target date: 08/04/21   Pt will be able to have at least 3 days / week where she has a good bowel movement Baseline:  Goal status: INITIAL   2.  Pt will be independent with advanced HEP to  maintain improvements made throughout therapy   Baseline:  Goal status: INITIAL   3.  Pt will report 50% less abdominal bloating and discomfort due to improved muscle tone throughout the core  Baseline:  Goal status: INITIAL   4.  Pt will report 50% reduction of pain due to improvements in posture, strength, and muscle length   Baseline:  Goal status: INITIAL       PLAN: PT FREQUENCY: 1x/week   PT DURATION: other: 16 (reducing frequency as able)   PLANNED INTERVENTIONS: Therapeutic exercises, Therapeutic activity, Neuromuscular re-education, Balance training, Gait training, Patient/Family education, Joint mobilization, Dry Needling, Electrical stimulation, Cryotherapy, Moist heat, Taping, Biofeedback, and Manual therapy   PLAN FOR NEXT SESSION: pelvic floor DN; using biofeedback or rectal dilators  for down training, f/u on more trusting the body   Heather Roberts, PT, DPT05/18/231:55 PM

## 2021-05-25 ENCOUNTER — Encounter: Payer: Self-pay | Admitting: Physical Therapy

## 2021-05-25 ENCOUNTER — Ambulatory Visit: Payer: 59 | Admitting: Physical Therapy

## 2021-05-25 DIAGNOSIS — R252 Cramp and spasm: Secondary | ICD-10-CM

## 2021-05-25 DIAGNOSIS — R278 Other lack of coordination: Secondary | ICD-10-CM

## 2021-05-25 NOTE — Therapy (Signed)
[place] [facility] @ [facility] [place], [place], [address] Phone: [phone]   Fax:  [phone]  Patient Details  Name: Brittany [name] MRN: [id number] Date of Birth: 09-Feb-1990 Referring Provider:  Inda Coke, PA  Encounter Date: 05/25/2021   PERTINENT HISTORY:  OUTPATIENT PHYSICAL THERAPY TREATMENT NOTE   Patient Name: Brittany [name] MRN: [id number] DOB:[birth year], 31 y.o., female Today's Date: 05/25/2021  PCP: Inda Coke, PA REFERRING PROVIDER: Inda Coke, PA  END OF SESSION:   PT End of Session - 05/25/21 1618     Visit Number 8    Date for PT Re-Evaluation 07/07/21    Authorization Type [employer]    Authorization - Visit Number 8    Authorization - Number of Visits 17    PT Start Time 2376    PT Stop Time 1655    PT Time Calculation (min) 40 min    Activity Tolerance Patient tolerated treatment well    Behavior During Therapy [facility] for tasks assessed/performed               Past Medical History:  Diagnosis Date   Ankle injury    Anxiety    Eating disorder    Frequent headaches    Past Surgical History:  Procedure Laterality Date   ANKLE SURGERY  2018   HERNIA REPAIR     WISDOM TOOTH EXTRACTION     Patient Active Problem List   Diagnosis Date Noted   Positive ANA (antinuclear antibody) 10/19/2019   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   Anorexia nervosa, restricting type, in full remission, moderate 02/16/2018   History of depression 02/16/2018   GAD (generalized anxiety disorder) 12/16/2017   Pelvic floor dysfunction 12/16/2017   Right hip pain 11/02/2017   Grade 2 ankle sprain, right, initial encounter 05/16/2016   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 Pelvic floor muscles are very tight due to being in a lot of pain.   PAIN:  Are you having pain? Yes: NPRS scale: 6/10 Pain location: lumbar sacral area that the neural modulation unit was placed in Pain description: achy Aggravating factors: activity, staying in a position for more than 30 minutes, laying on back Relieving factors: not being on it   OBJECTIVE: (objective measures completed at initial evaluation unless otherwise dated)  LUMBAR SPECIAL TESTS:  Not tested due to time   FUNCTIONAL TESTS:  Not tested due to time   GAIT: Comments: WFL   POSTURE:  Anterior pelvic tilt   LUMBARAROM/PROM    (Blank rows = not tested)   LE ROM:   Not tested due to time (Blank rows = not tested)   LE MMT: Time constraints   PELVIC MMT:   MMT   04/14/2021  Internal Anal Sphincter 3/5  External Anal Sphincter 2/5  Puborectalis 4/5  (Blank rows = not tested)         PALPATION:   General  tight lumbar paraspinals                 External Perineal Exam normal  Internal Pelvic Floor TTP of sphincters and difficulty relaxing; can bulge partially; unable to palpate beyond puborectalis   TONE: high   PROLAPSE: None noted in supine   TODAY'S TREATMENT  05/25/2021 Manual: Soft tissue mobilization: to assess for dry needling; manual work to gluteus, coccygeus, puborectalis, obturator internist, to elongate after dry needling.  Trigger Point Dry-Needling  Treatment instructions: Expect mild to moderate muscle soreness. S/S of pneumothorax if dry needled over a lung field, and to seek immediate medical attention should they occur. Patient verbalized understanding of these instructions and education.  Patient Consent Given: Yes Education handout provided: Yes Muscles treated: coccygeus,  puborectalis, obturator internist, gluteus maximus Electrical stimulation performed: No Parameters: N/A Treatment response/outcome: elongation of muscles and trigger point response.     Treatment:05/21/21 Manual DN to Bil obturator internus Internal vaginal release of Rt obturator internus/levator ani/superficial pelvic foor  Treatment:05/12/21 Neuro re-ed  Side bending seated with breathing and bulging Breathing and cues to relax pelvic floor done with both internal and external soft tissue work Manual Coccyx mobs in quadruped and sitting with rocking and sidebending STM to lumbar and thoracic paraspinals Patient confirms identification and approves physical therapist to perform internal soft tissue work : rectal sphincters, puborectalis, coccyx mob  Treatment:05/05/21 Self Care Bowel retraining education - mantra is going to be "I am safe" so that she can spend less time in the bathroom Discussed ways to use the rectal dilator or the probe sensor to work on relaxing while on the toilet    PATIENT EDUCATION:  Education details: normal recovery time from Inter-stim placement            Person educated: Patient Education method: Explanation Education comprehension: verbalized understanding     HOME EXERCISE PROGRAM: Purchase choice of rectal neuro re-ed equipment to use in clinic and at home   ASSESSMENT:   CLINICAL IMPRESSION: Patient was able to lay on her stomach for the dry needling. Her pain from the surgery has reduced some. She has had trigger point  response with the dry needling. Her pelvic floor muscles did not feel as clenched after the dry needling. Her muscles did tighten up after the device was inserted due to her pain. Pt will benefit from skilled PT to continue to work on coordination and improved bowel movements.      OBJECTIVE IMPAIRMENTS decreased coordination, decreased endurance, decreased strength, increased muscle spasms, impaired flexibility, impaired tone,  and pain.    ACTIVITY LIMITATIONS  toileting and interpersonal relationships .    PERSONAL FACTORS Time since onset of injury/illness/exacerbation and connective tissue disorder  are also affecting patient's functional outcome.      REHAB POTENTIAL: Good   CLINICAL DECISION MAKING: Evolving/moderate complexity   EVALUATION COMPLEXITY: Moderate     GOALS: Goals reviewed with patient? Yes   SHORT TERM GOALS: Target date: 05/13/2021   Pt will have either dilator or internal sensor to use for treatment Baseline: has both Goal status: MET     LONG TERM GOALS: Target date: 08/04/21   Pt will be able to have at least 3 days / week where she has a good bowel movement Baseline:  Goal status: INITIAL   2.  Pt will be independent with advanced HEP to maintain improvements made throughout therapy   Baseline:  Goal status: INITIAL   3.  Pt will report 50% less abdominal bloating and discomfort due to improved muscle tone throughout the core  Baseline:  Goal status: INITIAL   4.  Pt will report 50% reduction of pain due to improvements in posture, strength, and muscle length   Baseline:  Goal status: INITIAL       PLAN: PT FREQUENCY: 1x/week   PT DURATION: other: 16 (reducing frequency as able)   PLANNED INTERVENTIONS: Therapeutic exercises, Therapeutic activity, Neuromuscular re-education, Balance training, Gait training, Patient/Family education, Joint mobilization, Dry Needling, Electrical stimulation, Cryotherapy, Moist heat, Taping, Biofeedback, and Manual therapy   PLAN FOR NEXT SESSION: pelvic floor DN 1 time per week;  using biofeedback or rectal dilators for down training, f/u on more trusting the body        Earlie Counts, PT 05/25/21 5:05 PM   [place] [facility] [place] @ [facility] [place], [place], [address] Phone: [phone]   Fax:  [phone]

## 2021-05-25 NOTE — Therapy (Unsigned)
OUTPATIENT PHYSICAL THERAPY TREATMENT NOTE   Patient Name: Brittany [name] MRN: [id number] DOB:1990-02-28, 31 y.o., female Today's Date: 05/26/2021  PCP: Inda Coke, PA REFERRING PROVIDER: Inda Coke, PA  END OF SESSION:   PT End of Session - 05/26/21 1158     Visit Number 9    Date for PT Re-Evaluation 07/07/21    Authorization Type UHC    Authorization - Visit Number 9    Authorization - Number of Visits 71    PT Start Time 1497   late   PT Stop Time 1230    PT Time Calculation (min) 31 min    Activity Tolerance Patient tolerated treatment well    Behavior During Therapy WFL for tasks assessed/performed                Past Medical History:  Diagnosis Date   Ankle injury    Anxiety    Eating disorder    Frequent headaches    Past Surgical History:  Procedure Laterality Date   ANKLE SURGERY  2018   HERNIA REPAIR     WISDOM TOOTH EXTRACTION     Patient Active Problem List   Diagnosis Date Noted   Positive ANA (antinuclear antibody) 10/19/2019   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   Anorexia nervosa, restricting type, in full remission, moderate 02/16/2018   History of depression 02/16/2018   GAD (generalized anxiety disorder) 12/16/2017   Pelvic floor dysfunction 12/16/2017   Right hip pain 11/02/2017   Grade 2 ankle sprain, right, initial encounter 05/16/2016   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    PAIN:  Are you having pain? No   OBJECTIVE: (objective measures completed at initial evaluation unless otherwise  dated)  LUMBAR SPECIAL TESTS:  Not tested due to time   FUNCTIONAL TESTS:  Not tested due to time   GAIT: Comments: WFL   POSTURE:  Anterior pelvic tilt   LUMBARAROM/PROM    (Blank rows = not tested)   LE ROM:   Not tested due to time (Blank rows = not tested)   LE MMT: Time constraints   PELVIC MMT:   MMT   04/14/2021  Vaginal    Internal Anal Sphincter 3/5  External Anal Sphincter 2/5  Puborectalis 4/5  Diastasis Recti    (Blank rows = not tested)         PALPATION:   General  tight lumbar paraspinals                 External Perineal Exam normal                             Internal Pelvic Floor TTP of sphincters and difficulty relaxing; can bulge partially; unable to palpate beyond puborectalis   TONE: high   PROLAPSE: None noted in supine   TODAY'S TREATMENT  Treatment:05/26/21 Neuro re-ed  Breathing and bulging while staying relaxed - internal TC in sidelying Manual Patient confirms identification and approves physical therapist to perform internal soft tissue work : rectal sphincters, puborectalis, coccyx mob  Treatment:05/12/21 Neuro re-ed  Side bending seated with breathing and bulging Breathing and cues to relax pelvic floor done with both internal and external soft tissue work Manual Coccyx mobs in quadruped and sitting with rocking and sidebending STM to lumbar and thoracic paraspinals Patient confirms identification and approves physical therapist to perform internal soft tissue work : rectal sphincters, puborectalis, coccyx mob  Treatment:05/05/21 Self Care Bowel retraining education - mantra is going to be "I am safe" so that she can spend less time in the bathroom Discussed ways to use the rectal dilator or the probe sensor to work on relaxing while on the toilet       PATIENT EDUCATION:  Education details: tools to work on bowel movements                       Person educated: Patient Education method: Explanation Education  comprehension: verbalized understanding     HOME EXERCISE PROGRAM: Purchase choice of rectal neuro re-ed equipment to use in clinic and at home   ASSESSMENT:   CLINICAL IMPRESSION: Pt had inter stim placed and that is still causing some pain.  Today's session focused on muscle coordination with pushing.  After cues given pt did better at not tightening the pelvic floor when bulging.  She was able to do this 5x without tightening the muscles again.  Pt will benefit from skilled PT to continue to work on coordination and improved bowel movements.      OBJECTIVE IMPAIRMENTS decreased coordination, decreased endurance, decreased strength, increased muscle spasms, impaired flexibility, impaired tone, and pain.    ACTIVITY LIMITATIONS  toileting and interpersonal relationships .    PERSONAL FACTORS Time since onset of injury/illness/exacerbation and connective tissue disorder  are also affecting patient's functional outcome.      REHAB POTENTIAL: Good   CLINICAL DECISION MAKING: Evolving/moderate complexity   EVALUATION COMPLEXITY: Moderate     GOALS: Goals reviewed with patient? Yes   SHORT TERM GOALS: Target date: 05/13/2021   Pt will have either dilator or internal sensor to use for treatment Baseline: has both Goal status: MET     LONG TERM GOALS: Target date: 08/04/21   Pt will be able to have at least 3 days / week where she has a good bowel movement Baseline:  Goal status: ongoing   2.  Pt will be independent with advanced HEP to maintain improvements made throughout therapy   Baseline:  Goal status: Ongoing   3.  Pt will report 50% less abdominal bloating and discomfort due to improved muscle tone throughout the core  Baseline:  Goal status: ongoing   4.  Pt will report 50% reduction of pain due to improvements in posture, strength, and muscle length   Baseline: more pain after implant Goal status: Ongoing       PLAN: PT FREQUENCY: 1x/week   PT DURATION:  other: 16 (reducing frequency as able)   PLANNED INTERVENTIONS: Therapeutic exercises, Therapeutic activity, Neuromuscular re-education, Balance training, Gait training, Patient/Family education, Joint mobilization, Dry Needling, Electrical stimulation, Cryotherapy, Moist heat, Taping, Biofeedback, and Manual therapy   PLAN FOR NEXT SESSION: biofeedback downtrain    Jakki L Duane Trias, PT 05/26/2021, 11:59 AM

## 2021-05-26 ENCOUNTER — Ambulatory Visit: Payer: 59 | Admitting: Physical Therapy

## 2021-05-26 ENCOUNTER — Encounter: Payer: Self-pay | Admitting: Physician Assistant

## 2021-05-26 ENCOUNTER — Encounter: Payer: Self-pay | Admitting: Physical Therapy

## 2021-05-26 DIAGNOSIS — R278 Other lack of coordination: Secondary | ICD-10-CM | POA: Diagnosis not present

## 2021-05-26 DIAGNOSIS — R252 Cramp and spasm: Secondary | ICD-10-CM

## 2021-05-26 LAB — FOLLICLE STIMULATING HORMONE: FSH: 9.7

## 2021-05-26 LAB — LUTEINIZING HORMONE: LH: 10.4

## 2021-05-26 LAB — ESTRADIOL: Estradiol: 83

## 2021-05-26 LAB — CORTISOL: Cortisol: 7.2

## 2021-06-03 ENCOUNTER — Encounter: Payer: Self-pay | Admitting: Physical Therapy

## 2021-06-03 ENCOUNTER — Ambulatory Visit: Payer: 59 | Admitting: Physical Therapy

## 2021-06-03 DIAGNOSIS — R278 Other lack of coordination: Secondary | ICD-10-CM | POA: Diagnosis not present

## 2021-06-03 DIAGNOSIS — R252 Cramp and spasm: Secondary | ICD-10-CM

## 2021-06-03 NOTE — Therapy (Signed)
OUTPATIENT PHYSICAL THERAPY TREATMENT NOTE   Patient Name: Brittany [name] MRN: [id number] DOB:Oct 19, 1990, 31 y.o., female Today's Date: 06/03/2021  PCP: Inda Coke, PA REFERRING PROVIDER: Inda Coke, PA  END OF SESSION:   PT End of Session - 06/03/21 1451     Visit Number 10    Date for PT Re-Evaluation 07/07/21    Authorization Type UHC    Authorization - Visit Number 10    Authorization - Number of Visits 60    PT Start Time 6734    PT Stop Time 1525    PT Time Calculation (min) 40 min    Activity Tolerance Patient tolerated treatment well    Behavior During Therapy WFL for tasks assessed/performed             Past Medical History:  Diagnosis Date   Ankle injury    Anxiety    Eating disorder    Frequent headaches    Past Surgical History:  Procedure Laterality Date   ANKLE SURGERY  2018   HERNIA REPAIR     WISDOM TOOTH EXTRACTION     Patient Active Problem List   Diagnosis Date Noted   Positive ANA (antinuclear antibody) 10/19/2019   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   Anorexia nervosa, restricting type, in full remission, moderate 02/16/2018   History of depression 02/16/2018   GAD (generalized anxiety disorder) 12/16/2017   Pelvic floor dysfunction 12/16/2017   Right hip pain 11/02/2017   Grade 2 ankle sprain, right, initial encounter 05/16/2016   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   PAIN:  Are you having pain? Yes: NPRS scale: 5/10 Pain location: abdomen, rectum, tailbone, sacrum, hip Pain description: achy Aggravating factors:  when not able to empty her rectum Relieving factors: massage   OBJECTIVE: (objective measures completed at initial evaluation unless otherwise dated)  LUMBAR SPECIAL TESTS:  Not tested due to time   FUNCTIONAL TESTS:  Not tested due to time   GAIT: Comments: WFL   POSTURE:  Anterior pelvic tilt   LUMBARAROM/PROM    (Blank rows = not tested)   LE ROM:   Not tested due to time (Blank rows = not tested)   LE MMT: Time constraints   PELVIC MMT:   MMT   04/14/2021  Vaginal    Internal Anal Sphincter 3/5  External Anal Sphincter 2/5  Puborectalis 4/5  Diastasis Recti    (Blank rows = not tested)         PALPATION:   General  tight lumbar paraspinals                 External Perineal Exam normal                             Internal Pelvic Floor TTP of sphincters and difficulty relaxing; can bulge partially; unable to palpate beyond puborectalis   TONE: high   PROLAPSE: None noted in supine   TODAY'S TREATMENT  06/03/2021 Manual: Soft tissue mobilization:to assess for dry needling,  soft tissue work to the rectus abdominus to elongate after dry needling.  Internal pelvic floor techniques:manual work on the perineal body, levator ani and coccygeus to elongate after dry needling; mobilization of the coccyx with distraction Trigger Point Dry-Needling  Treatment instructions: Expect mild to moderate muscle soreness. S/S of pneumothorax if dry needled over a lung field, and to seek immediate medical attention should they occur. Patient verbalized understanding of these instructions and education.  Patient Consent Given: Yes Education handout provided: Previously provided Muscles treated: perineal body, ischiocavernosus, puborectalis, obturator internist, coccygeus, rectus abdominus Electrical stimulation performed: No Parameters: N/A Treatment response/outcome: elongation of muscle and trigger point response.    Patient confirmed identification and approved PT to  place her finger in the vaginal canal and anal canal to elongate the pelvic floor muscles and dry needle them.  Treatment:05/26/21 Neuro re-ed  Breathing and bulging while staying relaxed - internal TC in sidelying Manual Patient confirms identification and approves physical therapist to perform internal soft tissue work : rectal sphincters, puborectalis, coccyx mob   Treatment:05/12/21 Neuro re-ed  Side bending seated with breathing and bulging Breathing and cues to relax pelvic floor done with both internal and external soft tissue work Manual Coccyx mobs in quadruped and sitting with rocking and sidebending STM to lumbar and thoracic paraspinals Patient confirms identification and approves physical therapist to perform internal soft tissue work : rectal sphincters, puborectalis, coccyx mob   Treatment:05/05/21 Self Care Bowel retraining education - mantra is going to be "I am safe" so that she can spend less time in the bathroom Discussed ways to use the rectal dilator or the probe sensor to work on relaxing while on the toilet         PATIENT EDUCATION:  Education details: tools to work on bowel movements                       Person educated: Patient Education method: Explanation Education comprehension: verbalized understanding     HOME EXERCISE PROGRAM: Purchase choice of rectal neuro re-ed equipment to use in clinic and at home   ASSESSMENT:   CLINICAL IMPRESSION: Pt had inter stim placed and that is still causing some pain.  Patient did well with the dry needling. We were able to work internally in the rectum and vaginal to elongate the muscles. Her coccyx has increased in mobility. She does not have the device in so her muscles can relax more after the dry needling and see more carry over. Her abdominal muscles relaxed after the dry needling. Pt will benefit from skilled PT to continue to work on coordination and improved bowel movements.      OBJECTIVE IMPAIRMENTS  decreased coordination, decreased endurance, decreased strength, increased muscle spasms, impaired flexibility, impaired tone, and pain.    ACTIVITY LIMITATIONS  toileting and interpersonal relationships .    PERSONAL FACTORS Time since onset of injury/illness/exacerbation and connective tissue disorder  are also affecting patient's functional outcome.      REHAB POTENTIAL: Good   CLINICAL DECISION MAKING: Evolving/moderate complexity   EVALUATION COMPLEXITY: Moderate     GOALS: Goals reviewed with patient? Yes   SHORT TERM GOALS: Target date: 05/13/2021   Pt will have either dilator or internal sensor to use for treatment Baseline: has both Goal status: MET     LONG TERM GOALS: Target date: 08/04/21   Pt will be able to have at least 3 days / week where she has a good bowel movement Baseline:  Goal status: ongoing   2.  Pt will be independent  with advanced HEP to maintain improvements made throughout therapy   Baseline:  Goal status: Ongoing   3.  Pt will report 50% less abdominal bloating and discomfort due to improved muscle tone throughout the core  Baseline:  Goal status: ongoing   4.  Pt will report 50% reduction of pain due to improvements in posture, strength, and muscle length   Baseline: more pain after implant Goal status: Ongoing       PLAN: PT FREQUENCY: 1x/week   PT DURATION: other: 16 (reducing frequency as able)   PLANNED INTERVENTIONS: Therapeutic exercises, Therapeutic activity, Neuromuscular re-education, Balance training, Gait training, Patient/Family education, Joint mobilization, Dry Needling, Electrical stimulation, Cryotherapy, Moist heat, Taping, Biofeedback, and Manual therapy   PLAN FOR NEXT SESSION: down training with internal anal sensor; continue with dry needling every other session to the rectus and pelvic floor muscles   Earlie Counts, PT 06/03/21 3:33 PM

## 2021-06-08 NOTE — Progress Notes (Unsigned)
Subjective:    Patient ID: Brittany [name], female    DOB: 1990/11/07, 31 y.o.   MRN: [id number]  HPI  Brittany [name] is a 31 year old woman who presents for follow-up of chronic pelvic pain.  1) Pelvic floor dysfunction: -they second neuromodulation attempt did not help and was very painful. She now has scars on her buttocks -the lead was very sensitive -she had a minor concern before any of this because she always has pain in the sacrum.  -she is trying dry needling in the pelvic flood and is seeing a pelvic floor therapist to do rectal biofeedback and trying rectal dilators. She has progressed in that she can use bigger ones.  -She has done PT which she feels is a temporary band aid.  -Average pain is 7/10 -She feels that some, but not all of the pain is from anxiety. 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  -pain has been more bearable -she is trying to find a lot of mental health help -she is continuing with therapy -pain is still very bad often and extremely life-limiting.  -she has benefited from working with chiropractor -she is very limited in what she can physically do -sh has tried valium suppositories -she uses a splint and release tool and it helps to keep the prolapse in place to that she can have a more normal BM.   2) Hypermobility: - she has chronic ankle injuries as a result -she fees that a lot of her tightness is related to her OCD -she recently got EDS test -she will be following with endocrinology  3) Throat tightness: -she loves to sing and this has inhibited her ability to do so.   3) Lower back pain -improved with chiropractor  4) Anxiety -is trying valium suppositories  5) Visual disturbances: -worse with stress -discussed with her father who has floaters and she has a family history of migraines  Pain Inventory Average Pain 6 Pain Right Now  4 My pain is intermittent, constant, sharp, dull, tingling, and aching  In the last 24 hours, has pain interfered with the following? General activity 10 Relation with others 8 Enjoyment of life 10 What TIME of day is your pain at its worst? daytime and evening Sleep (in general) Fair  Pain is worse with: bending, inactivity, unsure, and some activites Pain improves with: therapy/exercise Relief from Meds: 0  walk without assistance how many minutes can you walk? depends ability to climb steps?  yes do you drive?  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  bladder control problems bowel control problems weakness numbness spasms dizziness depression anxiety       Family History  Problem Relation Age of Onset   Depression Mother    Anxiety disorder Mother    Depression Brother    Anxiety disorder Brother    Diabetes Maternal Grandmother    Hearing loss Maternal Grandmother    Alcohol abuse Maternal Grandfather    Depression Maternal Grandfather    Diabetes Maternal Grandfather    Dementia Paternal Grandfather    Thyroid disease Brother    Social History   Socioeconomic History   Marital status: Married    Spouse name: Not on file   Number of children: Not on file   Years of education: Not on file   Highest education  level: Not on file  Occupational History   Not on file  Tobacco Use   Smoking status: Never   Smokeless tobacco: Never  Vaping Use   Vaping Use: Never used  Substance and Sexual Activity   Alcohol use: No    Alcohol/week: 0.0 standard drinks   Drug use: No   Sexual activity: Yes    Birth control/protection: None  Other Topics Concern   Not on file  Social History Narrative   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  Opioid Risk Score:   Fall Risk Score:  `1  Depression screen [facility] 2/9     10/24/2020    2:48 PM 08/26/2020    2:03 PM 07/22/2020    2:18 PM 04/07/2020    2:06 PM 04/02/2019    8:38 AM 02/09/2016    3:08 PM 09/04/2015    9:37 AM  Depression screen PHQ 2/9  Decreased Interest 1 1 1 1  0 0 0  Down, Depressed, Hopeless 2 1 1 1 1  0 0  PHQ - 2 Score 3 2 2 2 1  0 0  Altered sleeping 1   1 0    Tired, decreased energy 1   1 1     Change in appetite 0   0 0    Feeling bad or failure about yourself  3   3 3     Trouble concentrating 3   3 2     Moving slowly or fidgety/restless 1   1 0    Suicidal thoughts 0   1 2    PHQ-9 Score 12   12 9     Difficult doing work/chores Very difficult   Extremely dIfficult Somewhat difficult     Review of Systems  Constitutional: Negative.        Night sweats  HENT: Negative.    Eyes: Negative.   Respiratory: Negative.    Cardiovascular: Negative.   Gastrointestinal:  Positive for abdominal pain, constipation and diarrhea.  Endocrine: Negative.   Musculoskeletal:  Positive for arthralgias, back pain and neck pain.       Stomach and pelvic area  Skin: Negative.   Allergic/Immunologic: Negative.   Psychiatric/Behavioral:  The patient is nervous/anxious.   All other systems reviewed and are negative.     Objective:   Physical Exam Gen: no distress, normal appearing, weight 118 lbs, BMI 20.25, BP 109/69 HEENT: oral mucosa pink and moist, NCAT Cardio: Reg rate Chest: normal effort, normal rate of breathing Abd: soft, non-distended Ext: no edema Psych: pleasant, normal affect, +situational anxiety- appropriate Skin: intact Neuro: Alert and  oriented x3 Musculoskeletal: diffuse tightness, active spasms during exam, hypermobility    Assessment & Plan:  Brittany [name] is a 31 year old woman who presents to establish care for pelvic floor dysfunction.  1) Pelvic floor dysfunction: -she takes magnesium citrate and even then she cannot  have complete evacuation. -continue pelvic floor PT, rectal biofeedback, progressive rectal dilation.  -once in a while she has bowel incontinence -She has pelvic floor pain and has had great benefit from pelvic floor therapists.  -discussed pudendal nerve entrapment, prolotherapy. Provided link for further education.  -discussed TCA. -continue magnesium citrate.  -this has a huge impact on her quality of life.  -discussed anti-spasticity medications and Botox. Extensively discussed the benefits of Botox.  -Provided with a pain relief journal and discussed that it contains foods and lifestyle tips to naturally help to improve pain. Discussed that these lifestyle strategies are also very good for health unlike some medications which can have negative side effects. Discussed that the act of keeping a journal can be therapeutic and helpful to realize patterns what helps to trigger and alleviate pain.   -continue internal massaging -Discussed current symptoms of pain and history of pain.  -Discussed benefits of exercise in reducing pain. -Discussed following foods that may reduce pain: 1) Ginger (especially studied for arthritis)- reduce leukotriene production to decrease inflammation 2) Blueberries- high in phytonutrients that decrease inflammation 3) Salmon- marine omega-3s reduce joint swelling and pain 4) Pumpkin seeds- reduce inflammation 5) dark chocolate- reduces inflammation 6) turmeric- reduces inflammation 7) tart cherries - reduce pain and stiffness 8) extra virgin olive oil - its compound olecanthal helps to block prostaglandins  9) chili peppers- can be eaten or applied topically via  capsaicin 10) mint- helpful for headache, muscle aches, joint pain, and itching 11) garlic- reduces inflammation  Link to further information on diet for chronic pain: [email or web address]   2) Anxiety: -continue Lexapro -she is seeing a mental health therapist.  -continue valium suppositories -Discussed exercise and meditation as tools to decrease anxiety. -Recommended Down Dog Yoga app -Discussed spending time outdoors. -Discussed positive re-framing of anxiety.  -Discussed the following foods that have been show to reduce anxiety: 1) [place] nuts, mushrooms, soy beans due to their high selenium content. Upper limit of toxicity of selenium is 432mcg/day so no more than 3-4 [place] nuts per day.  2) Fatty fish such as salmon, mackerel, sardines, trout, and herring- high in omega-3 fatty acids 3) Eggs- increases serotonin and dopamine 4) Pumpkin seeds- high in omega-3 fatty acids 5) dark chocolate- high in flavanols that increase blood flow to brain 6) turmeric- take with black pepper to increase absorption 7) chamomile tea- antioxidant and anti-inflammatory properties 8) yogurt without sugar- supports gut-brain axis 9) green tea- contains L- theanine 10) blueberries- high in vitamin C and antioxidants 11) [place]- high in tryptophan which gets converted to serotonin 12) bell peppers- rich in vitamin C and antioxidants 13) citrus fruits- rich in vitamin C and antioxidants 14) almonds- high in vitamin E and healthy fats 15) chia seeds- high in omega-3 fatty acids  3) Sweating profusely at times: -could be autonomic dysfunction.   4) Hypermobility: -discussed with her that I will call as soon as EDS result returns.   5) Constipation: -continue mag citrate as needed, discussed its health benefits.   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   7) Visual disturbances  >40 minutes  spent in discussion of functional movement disorder, visual disturbances, constipation, her experience with neuromodulation, sharing my patient's success with [facility] with her, offering neuropsych services, anxiety, inability to tolerate valium suppositories, concern if her symptoms are neurological

## 2021-06-09 ENCOUNTER — Encounter: Payer: 59 | Attending: Physical Medicine and Rehabilitation | Admitting: Physical Medicine and Rehabilitation

## 2021-06-09 ENCOUNTER — Encounter: Payer: Self-pay | Admitting: Physical Medicine and Rehabilitation

## 2021-06-09 VITALS — BP 109/69 | HR 78 | Ht 64.0 in | Wt 118.0 lb

## 2021-06-09 DIAGNOSIS — H539 Unspecified visual disturbance: Secondary | ICD-10-CM | POA: Diagnosis present

## 2021-06-09 DIAGNOSIS — G8929 Other chronic pain: Secondary | ICD-10-CM | POA: Insufficient documentation

## 2021-06-09 DIAGNOSIS — F411 Generalized anxiety disorder: Secondary | ICD-10-CM | POA: Insufficient documentation

## 2021-06-09 DIAGNOSIS — M6289 Other specified disorders of muscle: Secondary | ICD-10-CM | POA: Insufficient documentation

## 2021-06-09 DIAGNOSIS — M545 Low back pain, unspecified: Secondary | ICD-10-CM | POA: Insufficient documentation

## 2021-06-09 NOTE — Patient Instructions (Signed)
Foods that may reduce pain: 1) Ginger (especially studied for arthritis)- reduce leukotriene production to decrease inflammation 2) Blueberries- high in phytonutrients that decrease inflammation 3) Salmon- marine omega-3s reduce joint swelling and pain 4) Pumpkin seeds- reduce inflammation 5) dark chocolate- reduces inflammation 6) turmeric- reduces inflammation 7) tart cherries - reduce pain and stiffness 8) extra virgin olive oil - its compound olecanthal helps to block prostaglandins  9) chili peppers- can be eaten or applied topically via capsaicin 10) mint- helpful for headache, muscle aches, joint pain, and itching 11) garlic- reduces inflammation  Link to further information on diet for chronic pain: [email or web address]   Anxiety: -Discussed exercise and meditation as tools to decrease anxiety. -Recommended Down Dog Yoga app -Discussed spending time outdoors. -Discussed positive re-framing of anxiety.  -Discussed the following foods that have been show to reduce anxiety: 1) [place] nuts, mushrooms, soy beans due to their high selenium content. Upper limit of toxicity of selenium is 475mcg/day so no more than 3-4 [place] nuts per day.  2) Fatty fish such as salmon, mackerel, sardines, trout, and herring- high in omega-3 fatty acids 3) Eggs- increases serotonin and dopamine 4) Pumpkin seeds- high in omega-3 fatty acids 5) dark chocolate- high in flavanols that increase blood flow to brain 6) turmeric- take with black pepper to increase absorption 7) chamomile tea- antioxidant and anti-inflammatory properties 8) yogurt without sugar- supports gut-brain axis 9) green tea- contains L- theanine 10) blueberries- high in vitamin C and antioxidants 11) [place]- high in tryptophan which gets converted to serotonin 12) bell peppers- rich in vitamin C and antioxidants 13) citrus fruits- rich in vitamin C and  antioxidants 14) almonds- high in vitamin E and healthy fats 15) chia seeds- high in omega-3 fatty acids

## 2021-06-10 ENCOUNTER — Ambulatory Visit: Payer: 59 | Attending: Physician Assistant | Admitting: Physical Therapy

## 2021-06-10 ENCOUNTER — Encounter: Payer: Self-pay | Admitting: Physical Therapy

## 2021-06-10 DIAGNOSIS — R278 Other lack of coordination: Secondary | ICD-10-CM | POA: Insufficient documentation

## 2021-06-10 DIAGNOSIS — R252 Cramp and spasm: Secondary | ICD-10-CM | POA: Diagnosis present

## 2021-06-10 NOTE — Therapy (Signed)
OUTPATIENT PHYSICAL THERAPY TREATMENT NOTE   Patient Name: Brittany [name] MRN: [id number] DOB:1990-12-07, 31 y.o., female Today's Date: 06/10/2021  PCP: Inda Coke, PA REFERRING PROVIDER: Inda Coke, PA  END OF SESSION:   PT End of Session - 06/10/21 0807     Visit Number 11    Date for PT Re-Evaluation 07/07/21    Authorization Type U[employer]    Authorization - Visit Number 11    Authorization - Number of Visits 3    PT Start Time 0807    PT Stop Time 0840    PT Time Calculation (min) 33 min    Activity Tolerance Patient tolerated treatment well    Behavior During Therapy [facility] for tasks assessed/performed             Past Medical History:  Diagnosis Date   Ankle injury    Anxiety    Eating disorder    Frequent headaches    Past Surgical History:  Procedure Laterality Date   ANKLE SURGERY  2018   HERNIA REPAIR     WISDOM TOOTH EXTRACTION     Patient Active Problem List   Diagnosis Date Noted   Positive ANA (antinuclear antibody) 10/19/2019   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   Anorexia nervosa, restricting type, in full remission, moderate 02/16/2018   History of depression 02/16/2018   GAD (generalized anxiety disorder) 12/16/2017   Pelvic floor dysfunction 12/16/2017   Right hip pain 11/02/2017   Grade 2 ankle sprain, right, initial encounter 05/16/2016   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 I have had 1 bowel movement today and need more movements to feel better. I just feel like there is more stool in the intestines.    PAIN:  Are you having pain?  Yes: NPRS scale: 5/10 Pain location: abdomen, rectum, tailbone, sacrum, hip Pain description: achy Aggravating factors: when not able to empty her rectum Relieving factors: massage   OBJECTIVE: (objective measures completed at initial evaluation unless otherwise dated)     GAIT: Comments: WFL   POSTURE:  Anterior pelvic tilt       PELVIC MMT:   MMT   04/14/2021  Vaginal    Internal Anal Sphincter 3/5  External Anal Sphincter 2/5  Puborectalis 4/5  (Blank rows = not tested)         PALPATION:   General  tight lumbar paraspinals                 External Perineal Exam normal                             Internal Pelvic Floor TTP of sphincters and difficulty relaxing; can bulge partially; unable to palpate beyond puborectalis   TONE: high   PROLAPSE: None noted in supine   TODAY'S TREATMENT  06/10/2021 Manual: Soft tissue mobilization: to assess for dry needling, manual work to the hip adductors to elongate after dry needling Internal pelvic floor techniques: Patient confirms identification and approves PT to assess pelvic floor and treatment. Internal work to assess for dry needling. Manual work to the pubococcygeus,  iliococcygeus, superior transverse perineum  Trigger Point Dry-Needling  Treatment instructions: Expect mild to moderate muscle soreness. S/S of pneumothorax if dry needled over a lung field, and to seek immediate medical attention should they occur. Patient verbalized understanding of these instructions and education. Patient Consent Given: yes Education handout provided: Previously provided Muscles treated: hip adductors, superior transverse perineum, coccygeus, iliococcygeus Electrical stimulation performed: No Parameters: N/A Treatment response/outcome: elongation of muscles and trigger point response.  Exercises: Stretches/mobility: Bilateral hip adductor stretch in standing 3 times each side holding for 30 seconds going slowly.   Deep squat stretch  to stretch the pelvic floor.     06/03/2021 Manual: Soft tissue mobilization:to assess for dry needling,  soft tissue work to the rectus abdominus to elongate after dry needling.  Internal pelvic floor techniques:manual work on the perineal body, levator ani and coccygeus to elongate after dry needling; mobilization of the coccyx with distraction Trigger Point Dry-Needling  Treatment instructions: Expect mild to moderate muscle soreness. S/S of pneumothorax if dry needled over a lung field, and to seek immediate medical attention should they occur. Patient verbalized understanding of these instructions and education.  Patient Consent Given: Yes Education handout provided: Previously provided Muscles treated: perineal body, ischiocavernosus, puborectalis, obturator internist, coccygeus, rectus abdominus Electrical stimulation performed: No Parameters: N/A Treatment response/outcome: elongation of muscle and trigger point response.    Patient confirmed identification and approved PT to place her finger in the vaginal canal and anal canal to elongate the pelvic floor muscles and dry needle them.  Treatment:05/26/21 Neuro re-ed  Breathing and bulging while staying relaxed - internal TC in sidelying Manual Patient confirms identification and approves physical therapist to perform internal soft tissue work : rectal sphincters, puborectalis, coccyx mob   Treatment:05/12/21 Neuro re-ed  Side bending seated with breathing and bulging Breathing and cues to relax pelvic floor done with both internal and external soft tissue work Manual Coccyx mobs in quadruped and sitting with rocking and sidebending STM to lumbar and thoracic paraspinals Patient confirms identification and approves physical therapist to perform internal soft tissue work : rectal sphincters, puborectalis, coccyx mob     PATIENT EDUCATION:  Education details: tools to work on bowel movements                       Person educated:  Patient Education method: Explanation Education comprehension: verbalized understanding     HOME EXERCISE PROGRAM: Purchase choice of rectal neuro re-ed equipment to use in clinic and at home   ASSESSMENT:   CLINICAL IMPRESSION: Pt had inter stim placed and that is still causing some pain.  Patient responded well with the dry needling and her muscles relaxed and were softer. Patient continues to have multiple urges to have a bowel movement in the morning. She continues to feel tightness in the pelvic floor muscles. The muscles were softer after the manual work and dry needling and she responds well with the dry needling. She was able to tolerate the therapist finger into the anal canal. She always feels there is stool in her intestines and rectum.  Pt will benefit from skilled PT to continue to work on coordination and improved bowel movements.      OBJECTIVE IMPAIRMENTS decreased coordination, decreased endurance, decreased strength, increased muscle spasms, impaired flexibility, impaired tone, and pain.    ACTIVITY LIMITATIONS  toileting and interpersonal relationships .    PERSONAL FACTORS Time since onset of injury/illness/exacerbation and connective tissue disorder  are also  affecting patient's functional outcome.      REHAB POTENTIAL: Good   CLINICAL DECISION MAKING: Evolving/moderate complexity   EVALUATION COMPLEXITY: Moderate     GOALS: Goals reviewed with patient? Yes   SHORT TERM GOALS: Target date: 05/13/2021   Pt will have either dilator or internal sensor to use for treatment Baseline: has both Goal status: MET     LONG TERM GOALS: Target date: 08/04/21   Pt will be able to have at least 3 days / week where she has a good bowel movement Baseline: not at this time, 06/10/2021 Goal status: ongoing   2.  Pt will be independent with advanced HEP to maintain improvements made throughout therapy  Baseline: Still learning as she progresses 06/10/2021 Goal status:  Ongoing   3.  Pt will report 50% less abdominal bloating and discomfort due to improved muscle tone throughout the core  Baseline: Bloating is off and on with not changes at this time, 06/10/2021 Goal status: ongoing   4.  Pt will report 50% reduction of pain due to improvements in posture, strength, and muscle length  Baseline: not at this time, 06/10/2021 Goal status: Ongoing       PLAN: PT FREQUENCY: 1x/week   PT DURATION: other: 16 (reducing frequency as able)   PLANNED INTERVENTIONS: Therapeutic exercises, Therapeutic activity, Neuromuscular re-education, Balance training, Gait training, Patient/Family education, Joint mobilization, Dry Needling, Electrical stimulation, Cryotherapy, Moist heat, Taping, Biofeedback, and Manual therapy   PLAN FOR NEXT SESSION: down training with internal anal sensor; continue with dry needling every other session to the rectus and pelvic floor muscles    Earlie Counts, PT 06/10/21 11:10 AM

## 2021-06-18 ENCOUNTER — Ambulatory Visit (HOSPITAL_BASED_OUTPATIENT_CLINIC_OR_DEPARTMENT_OTHER)
Admission: RE | Admit: 2021-06-18 | Discharge: 2021-06-18 | Disposition: A | Discharge status: Home / Self Care | Payer: 59 | Source: Ambulatory Visit | Attending: Physical Medicine and Rehabilitation | Admitting: Physical Medicine and Rehabilitation

## 2021-06-18 ENCOUNTER — Encounter: Payer: 59 | Admitting: Physical Therapy

## 2021-06-18 DIAGNOSIS — G8929 Other chronic pain: Secondary | ICD-10-CM | POA: Diagnosis not present

## 2021-06-18 DIAGNOSIS — M545 Low back pain, unspecified: Secondary | ICD-10-CM | POA: Diagnosis present

## 2021-06-19 ENCOUNTER — Ambulatory Visit: Payer: 59 | Admitting: Physical Therapy

## 2021-06-19 ENCOUNTER — Encounter: Payer: Self-pay | Admitting: Physical Therapy

## 2021-06-19 DIAGNOSIS — R278 Other lack of coordination: Secondary | ICD-10-CM | POA: Diagnosis not present

## 2021-06-19 DIAGNOSIS — R252 Cramp and spasm: Secondary | ICD-10-CM

## 2021-06-19 NOTE — Therapy (Signed)
OUTPATIENT PHYSICAL THERAPY TREATMENT NOTE   Patient Name: Brittany [name] MRN: [id number] DOB:[birth year], 31 y.o., female Today's Date: 06/19/2021  PCP: Inda Coke, PA REFERRING PROVIDER: Inda Coke, PA  END OF SESSION:   PT End of Session - 06/19/21 1026     Visit Number 12    Date for PT Re-Evaluation 07/07/21    Authorization Type U[employer]    Authorization - Visit Number 12    Authorization - Number of Visits 36    PT Start Time 1025   came late   PT Stop Time 1055    PT Time Calculation (min) 30 min    Activity Tolerance Patient tolerated treatment well    Behavior During Therapy WFL for tasks assessed/performed             Past Medical History:  Diagnosis Date   Ankle injury    Anxiety    Eating disorder    Frequent headaches    Past Surgical History:  Procedure Laterality Date   ANKLE SURGERY  2018   HERNIA REPAIR     WISDOM TOOTH EXTRACTION     Patient Active Problem List   Diagnosis Date Noted   Positive ANA (antinuclear antibody) 10/19/2019   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   Anorexia nervosa, restricting type, in full remission, moderate 02/16/2018   History of depression 02/16/2018   GAD (generalized anxiety disorder) 12/16/2017   Pelvic floor dysfunction 12/16/2017   Right hip pain 11/02/2017   Grade 2 ankle sprain, right, initial encounter 05/16/2016   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 I have had more trouble with my gut.     PAIN:  Are you having pain? Yes: NPRS scale: 5/10 Pain location:  abdomen, rectum, tailbone, sacrum, hip Pain description: achy Aggravating factors: when not able to empty her rectum Relieving factors: massage   OBJECTIVE: (objective measures completed at initial evaluation unless otherwise dated)       GAIT: Comments: WFL   POSTURE:  Anterior pelvic tilt       PELVIC MMT:   MMT   04/14/2021 06/19/2021  Vaginal     Internal Anal Sphincter 3/5 3/5  External Anal Sphincter 2/5 2/5  Puborectalis 4/5 4/5  (Blank rows = not tested)         PALPATION:   General  tight lumbar paraspinals                 External Perineal Exam normal                             Internal Pelvic Floor TTP of sphincters and difficulty relaxing; can bulge partially; unable to palpate beyond puborectalis   TONE: high   PROLAPSE: None noted in supine   TODAY'S TREATMENT  06/19/2021 Manual: Soft tissue mobilization: to bilateral gluteus medius and lumbar paraspinals to elongate after dry needling Internal pelvic floor techniques:No emotional/communication barriers or cognitive limitation. Patient is motivated to learn. Patient understands and agrees with treatment goals and plan. PT explains patient will  be examined in standing, sitting, and lying down to see how their muscles and joints work. When they are ready, they will be asked to remove their underwear so PT can examine their perineum. The patient is also given the option of providing their own chaperone as one is not provided in our facility. The patient also has the right and is explained the right to defer or refuse any part of the evaluation or treatment including the internal exam. With the patient's consent, PT will use one gloved finger to gently assess the muscles of the pelvic floor, seeing how well it contracts and relaxes and if there is muscle symmetry. After, the patient will get dressed and PT and patient will discuss exam findings and plan of care. PT and patient discuss plan of care, schedule,  attendance policy and HEP activities.  Going through the anus to assess for dry needling and elongation of the muscles after dry needling. Manual work to the coccygeus, levator ani and obturator internist monitoring for pain, coccyx mobilization to move it to the left.  Trigger Point Dry-Needling  Treatment instructions: Expect mild to moderate muscle soreness. S/S of pneumothorax if dry needled over a lung field, and to seek immediate medical attention should they occur. Patient verbalized understanding of these instructions and education.  Patient Consent Given: Yes Education handout provided: Previously provided Muscles treated: lumbar multifidi, gluteus medius, coccygeus, puborectalis Electrical stimulation performed: No Parameters: N/A Treatment response/outcome: elongation of tissue and trigger point response  Neuromuscular re-education: Down training:diaphragmatic breathing to push the therapist finger out of the anal canal and she was able to do it    06/10/2021 Manual: Soft tissue mobilization: to assess for dry needling, manual work to the hip adductors to elongate after dry needling Internal pelvic floor techniques: Patient confirms identification and approves PT to assess pelvic floor and treatment. Internal work to assess for dry needling. Manual work to the pubococcygeus, iliococcygeus, superior transverse perineum  Trigger Point Dry-Needling  Treatment instructions: Expect mild to moderate muscle soreness. S/S of pneumothorax if dry needled over a lung field, and to seek immediate medical attention should they occur. Patient verbalized understanding of these instructions and education. Patient Consent Given: yes Education handout provided: Previously provided Muscles treated: hip adductors, superior transverse perineum, coccygeus, iliococcygeus Electrical stimulation performed: No Parameters: N/A Treatment response/outcome: elongation of muscles and trigger point response.   Exercises: Stretches/mobility: Bilateral hip adductor stretch in standing 3 times each side holding for 30 seconds going slowly.   Deep squat stretch to stretch the pelvic floor.      06/03/2021 Manual: Soft tissue mobilization:to assess for dry needling,  soft tissue work to the rectus abdominus to elongate after dry needling.  Internal pelvic floor techniques:manual work on the perineal body, levator ani and coccygeus to elongate after dry needling; mobilization of the coccyx with distraction Trigger Point Dry-Needling  Treatment instructions: Expect mild to moderate muscle soreness. S/S of pneumothorax if dry needled over a lung field, and to seek immediate medical attention should they occur. Patient verbalized understanding of these instructions and education.  Patient Consent Given: Yes Education handout provided: Previously provided Muscles treated: perineal body, ischiocavernosus, puborectalis, obturator internist, coccygeus, rectus abdominus Electrical stimulation performed: No Parameters: N/A Treatment response/outcome: elongation of muscle and trigger point response.    Patient confirmed identification and approved PT to place her finger in the vaginal canal and anal canal to elongate the pelvic floor muscles and dry needle them.    HOME EXERCISE  PROGRAM: Purchase choice of rectal neuro re-ed equipment to use in clinic and at home   ASSESSMENT:   CLINICAL IMPRESSION: Pt had inter stim placed and that is still causing some pain.  Patient responded well with the dry needling and her muscles relaxed and were softer. Patient was able to push the therapist finger out of the anal canal with diaphragmatic breathing in sidely with knees to chest. Patient continues to have toileting issues and emptying her rectum. Patient had a MRI of her lumbar this week. Patient appears to have a hypersensitive GI system and anus due to feeling a lot of pressure and pain. She was able to tolerate the  therapist finger into the anal canal. She always feels there is stool in her intestines and rectum.  Pt will benefit from skilled PT to continue to work on coordination and improved bowel movements.      OBJECTIVE IMPAIRMENTS decreased coordination, decreased endurance, decreased strength, increased muscle spasms, impaired flexibility, impaired tone, and pain.    ACTIVITY LIMITATIONS  toileting and interpersonal relationships .    PERSONAL FACTORS Time since onset of injury/illness/exacerbation and connective tissue disorder  are also affecting patient's functional outcome.      REHAB POTENTIAL: Good   CLINICAL DECISION MAKING: Evolving/moderate complexity   EVALUATION COMPLEXITY: Moderate     GOALS: Goals reviewed with patient? Yes   SHORT TERM GOALS: Target date: 05/13/2021   Pt will have either dilator or internal sensor to use for treatment Baseline: has both Goal status: MET     LONG TERM GOALS: Target date: 08/04/21   Pt will be able to have at least 3 days / week where she has a good bowel movement Baseline: not at this time, 06/10/2021 Goal status: ongoing   2.  Pt will be independent with advanced HEP to maintain improvements made throughout therapy  Baseline: Still learning as she progresses 06/10/2021 Goal status: Ongoing   3.  Pt will report 50% less abdominal bloating and discomfort due to improved muscle tone throughout the core  Baseline: Bloating is off and on with not changes at this time, 06/10/2021 Goal status: ongoing   4.  Pt will report 50% reduction of pain due to improvements in posture, strength, and muscle length  Baseline: not at this time, 06/10/2021 Goal status: Ongoing       PLAN: PT FREQUENCY: 1x/week   PT DURATION: other: 16 (reducing frequency as able)   PLANNED INTERVENTIONS: Therapeutic exercises, Therapeutic activity, Neuromuscular re-education, Balance training, Gait training, Patient/Family education, Joint mobilization, Dry Needling,  Electrical stimulation, Cryotherapy, Moist heat, Taping, Biofeedback, and Manual therapy   PLAN FOR NEXT SESSION: down training with internal anal sensor; continue with dry needling every other session to the rectus and pelvic floor muscles      Earlie Counts, PT 06/19/21 11:05 AM

## 2021-06-22 ENCOUNTER — Encounter: Payer: Self-pay | Admitting: Physical Therapy

## 2021-06-22 ENCOUNTER — Ambulatory Visit: Payer: 59 | Admitting: Physical Therapy

## 2021-06-22 DIAGNOSIS — R278 Other lack of coordination: Secondary | ICD-10-CM

## 2021-06-22 DIAGNOSIS — R252 Cramp and spasm: Secondary | ICD-10-CM

## 2021-06-22 NOTE — Therapy (Signed)
OUTPATIENT PHYSICAL THERAPY TREATMENT NOTE   Patient Name: Brittany [name] MRN: [id number] DOB:1990-01-30, 31 y.o., female Today's Date: 06/22/2021  PCP: Inda Coke, PA REFERRING PROVIDER: Inda Coke, PA  END OF SESSION:   PT End of Session - 06/22/21 1408     Visit Number 13    Date for PT Re-Evaluation 07/07/21    Authorization Type [employer]    Authorization - Visit Number 13    Authorization - Number of Visits 68    PT Start Time 1408   had to go to the bathroom when she got to therapy   PT Stop Time 1446    PT Time Calculation (min) 38 min    Activity Tolerance Patient tolerated treatment well    Behavior During Therapy [facility] for tasks assessed/performed             Past Medical History:  Diagnosis Date   Ankle injury    Anxiety    Eating disorder    Frequent headaches    Past Surgical History:  Procedure Laterality Date   ANKLE SURGERY  2018   [place] EXTRACTION     Patient Active Problem List   Diagnosis Date Noted   Positive ANA (antinuclear antibody) 10/19/2019   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   Anorexia nervosa, restricting type, in full remission, moderate 02/16/2018   History of depression 02/16/2018   GAD (generalized anxiety disorder) 12/16/2017   Pelvic floor dysfunction 12/16/2017   Right hip pain 11/02/2017   Grade 2 ankle sprain, right, initial encounter 05/16/2016   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   Right ankle pain 03/03/2015   IBS (irritable bowel syndrome) 05/25/2009   REFERRING DIAG: M62.89 (ICD-10-CM) - Pelvic floor dysfunction   THERAPY DIAG:  Other lack of coordination   Cramp and spasm   Rationale for Evaluation and Treatment Rehabilitation   PERTINENT HISTORY: Chronic issue   PRECAUTIONS: None   SUBJECTIVE: I did not get to bed till 4 AM due to having to get all of my therapy done plus work.  After last treatment had some rectal pain.  [name] been using the wand for trigger point release of the pelvic floor.  PAIN:  Are you having pain? Yes: NPRS scale: 5/10 Pain location: abdomen, rectum, tailbone, sacrum, hip Pain description: achy Aggravating factors: when not able to empty her rectum Relieving factors: massage   OBJECTIVE: (objective measures completed at initial evaluation unless otherwise dated)       GAIT: Comments: WFL   POSTURE:  Anterior pelvic tilt       PELVIC MMT:   MMT   04/14/2021 06/19/2021  Vaginal      Internal Anal Sphincter 3/5 3/5  External Anal Sphincter 2/5 2/5  Puborectalis 4/5 4/5  (Blank rows = not tested)         PALPATION:   General  tight lumbar paraspinals                 External Perineal Exam normal                             Internal Pelvic Floor TTP of sphincters and difficulty relaxing; can bulge partially; unable to palpate beyond puborectalis   TONE: high   PROLAPSE: None noted in supine   TODAY'S TREATMENT  06/22/2021 Manual: Soft tissue mobilization: to assess for dry needling. Manual work to right  gluteal and obturator internist to elongate after dry needling. Manual work along the right SI joint, medial side of the ischial tuberosity.   Trigger Point Dry-Needling  Treatment instructions: Expect mild to moderate muscle soreness. S/S of pneumothorax if dry needled over a lung field, and to seek immediate medical attention should they occur. Patient verbalized understanding of these instructions and education.  Patient Consent Given: Yes Education handout provided: Previously provided Muscles treated: gluteus and the obturator on the right Electrical stimulation performed: No Parameters: N/A Treatment response/outcome: elongation of the muscle and trigger point response  Neuromuscular re-education: Toileting: sitting with feet on squatty potty practicing breath work to push the stool out, verbal cues to use more force with breath, using sounds to help.   Self-care: Reviewed on how she is using the wand and dilators vaginally and rectally. Patient is able to push her rectal dilator out of the anus. Patient has to focus on relaxing to place the rectal dilator in.     06/19/2021 Manual: Soft tissue mobilization: to bilateral gluteus medius and lumbar paraspinals to elongate after dry needling Internal pelvic floor techniques:No emotional/communication barriers or cognitive limitation. Patient is motivated to learn. Patient understands and agrees with treatment goals and plan. PT explains patient will be examined in standing, sitting, and lying down to see how their muscles and joints work. When they are ready, they will be asked to remove their underwear so PT can examine their perineum. The patient is also given the option of providing their own chaperone as one is not provided in our facility. The patient also has the right and is explained the right to defer or refuse any part of the evaluation or treatment including the internal exam. With the patient's consent, PT will use one gloved finger to gently assess the muscles of the pelvic floor, seeing how well it contracts and relaxes and if there is muscle symmetry. After, the patient will get dressed and PT and patient will discuss exam findings and plan of care. PT and patient discuss plan of care, schedule, attendance policy and HEP activities.  Going through the anus to assess for dry needling and elongation of the muscles after dry needling. Manual work to the coccygeus, levator ani and obturator internist monitoring for pain, coccyx mobilization to move it to the left.  Trigger Point Dry-Needling  Treatment instructions: Expect mild to moderate muscle soreness. S/S of pneumothorax if dry needled over a lung field, and to seek immediate medical attention should they occur. Patient verbalized understanding of these instructions and education.   Patient Consent Given: Yes Education handout provided:  Previously provided Muscles treated: lumbar multifidi, gluteus medius, coccygeus, puborectalis Electrical stimulation performed: No Parameters: N/A Treatment response/outcome: elongation of tissue and trigger point response   Neuromuscular re-education: Down training:diaphragmatic breathing to push the therapist finger out of the anal canal and she was able to do it     06/10/2021 Manual: Soft tissue mobilization: to assess for dry needling, manual work to the hip adductors to elongate after dry needling Internal pelvic floor techniques: Patient confirms identification and approves PT to assess pelvic floor and treatment. Internal work to assess for dry needling. Manual work to the pubococcygeus, iliococcygeus, superior transverse perineum  Trigger Point Dry-Needling  Treatment instructions: Expect mild to moderate muscle soreness. S/S of pneumothorax if dry needled over a lung field, and to seek immediate medical attention should they occur. Patient verbalized understanding of these instructions and education. Patient Consent Given: yes Education handout  provided: Previously provided Muscles treated: hip adductors, superior transverse perineum, coccygeus, iliococcygeus Electrical stimulation performed: No Parameters: N/A Treatment response/outcome: elongation of muscles and trigger point response.  Exercises: Stretches/mobility: Bilateral hip adductor stretch in standing 3 times each side holding for 30 seconds going slowly.   Deep squat stretch to stretch the pelvic floor.      HOME EXERCISE PROGRAM: Purchase choice of rectal neuro re-ed equipment to use in clinic and at home   ASSESSMENT:   CLINICAL IMPRESSION: Pt had inter stim placed and that is still causing some pain.  Patient has less trigger points in the gluteal and pelvic floor. She has difficulty with breathing out to relax the pelvic floor in therapy. Patient is using her tools at home to work on the trigger points and  sitting on commode with anal dilator in the anus and pushing it out. Patient has learned calming technique through her mental health therapist. Patient may not need much more dry needling due to her muscles being softer. Pt will benefit from skilled PT to continue to work on coordination and improved bowel movements.      OBJECTIVE IMPAIRMENTS decreased coordination, decreased endurance, decreased strength, increased muscle spasms, impaired flexibility, impaired tone, and pain.    ACTIVITY LIMITATIONS  toileting and interpersonal relationships .    PERSONAL FACTORS Time since onset of injury/illness/exacerbation and connective tissue disorder  are also affecting patient's functional outcome.      REHAB POTENTIAL: Good   CLINICAL DECISION MAKING: Evolving/moderate complexity   EVALUATION COMPLEXITY: Moderate     GOALS: Goals reviewed with patient? Yes   SHORT TERM GOALS: Target date: 05/13/2021   Pt will have either dilator or internal sensor to use for treatment Baseline: has both Goal status: MET     LONG TERM GOALS: Target date: 08/04/21   Pt will be able to have at least 3 days / week where she has a good bowel movement Baseline: not at this time, 06/10/2021 Goal status: ongoing   2.  Pt will be independent with advanced HEP to maintain improvements made throughout therapy  Baseline: Still learning as she progresses 06/10/2021 Goal status: Ongoing   3.  Pt will report 50% less abdominal bloating and discomfort due to improved muscle tone throughout the core  Baseline: Bloating is off and on with not changes at this time, 06/10/2021 Goal status: ongoing   4.  Pt will report 50% reduction of pain due to improvements in posture, strength, and muscle length  Baseline: not at this time, 06/10/2021 Goal status: Ongoing       PLAN: PT FREQUENCY: 1x/week   PT DURATION: other: 16 (reducing frequency as able)   PLANNED INTERVENTIONS: Therapeutic exercises, Therapeutic activity,  Neuromuscular re-education, Balance training, Gait training, Patient/Family education, Joint mobilization, Dry Needling, Electrical stimulation, Cryotherapy, Moist heat, Taping, Biofeedback, and Manual therapy   PLAN FOR NEXT SESSION: down training with internal anal sensor; continue with dry needling every other session to the rectus and pelvic floor muscles      Earlie Counts, PT 06/22/21 2:59 PM

## 2021-06-24 ENCOUNTER — Encounter: Payer: 59 | Admitting: Physical Medicine and Rehabilitation

## 2021-06-24 ENCOUNTER — Ambulatory Visit: Payer: 59 | Admitting: Physical Therapy

## 2021-06-24 ENCOUNTER — Encounter: Payer: Self-pay | Admitting: Physical Therapy

## 2021-06-24 DIAGNOSIS — M6289 Other specified disorders of muscle: Secondary | ICD-10-CM | POA: Diagnosis not present

## 2021-06-24 DIAGNOSIS — R252 Cramp and spasm: Secondary | ICD-10-CM

## 2021-06-24 DIAGNOSIS — R278 Other lack of coordination: Secondary | ICD-10-CM

## 2021-06-24 NOTE — Progress Notes (Signed)
Subjective:    Patient ID: Brittany [name], female    DOB: 10/27/1990, 31 y.o.   MRN: [id number]  HPI  Brittany [name] is a 31 year old woman who presents for f/u of chronic pelvic pain.  1) Pelvic floor dysfunction: -they second neuromodulation attempt did not help and was very painful. She now has scars on her buttocks -pain has been stable since last visit -she asks how to get the medical records of her MRI to share with her therapist -she will be trying biofeedback today -she is trying dry needling in the pelvic flood and is seeing a pelvic floor therapist to do rectal biofeedback and trying rectal dilators. She has progressed in that she can use bigger ones.  -She has done PT which she feels is a temporary band aid.  -Average pain is 7/10 -She feels that some, but not all of the pain is from anxiety. 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  -pain has been more bearable -she is trying to find a lot of mental health help -she is continuing with therapy -pain is still very [name] often and extremely life-limiting.  -she has benefited from working with chiropractor -she is very limited in what she can physically do -sh has tried valium suppositories -she uses a splint and release tool and it helps to keep the prolapse in place to that she can have a more normal BM.   2) Hypermobility: - she has chronic ankle injuries as a result -she fees that a lot of her tightness is related to her OCD -she recently got EDS test -she will be following with endocrinology  3) Throat tightness: -she loves to sing and this has inhibited her ability to do so.   3) Lower back pain -improved with chiropractor  4) Anxiety -is trying valium suppositories  5) Visual disturbances: -worse with stress -discussed with her father who has floaters and she has a family history of migraines  6)  Constipation -still severe without improvement  Pain Inventory Average Pain 6 Pain Right Now 4 My pain is intermittent, constant, sharp, dull, tingling, and aching  In the last 24 hours, has pain interfered with the following? General activity 10 Relation with others 8 Enjoyment of life 10 What TIME of day is your pain at its worst? daytime and evening Sleep (in general) Fair  Pain is worse with: bending, inactivity, unsure, and some activites Pain improves with: therapy/exercise Relief from Meds: 0  walk without assistance how many minutes can you walk? depends ability to climb steps?  yes do you drive?  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  bladder control problems bowel control problems weakness numbness spasms dizziness depression anxiety       Family History  Problem Relation Age of Onset   Depression Mother    Anxiety disorder Mother    Depression Brother    Anxiety disorder Brother    Diabetes Maternal Grandmother    Hearing loss Maternal Grandmother    Alcohol abuse Maternal Grandfather    Depression Maternal Grandfather    Diabetes Maternal Grandfather    Dementia Paternal Grandfather    Thyroid disease Brother    Social History   Socioeconomic History   Marital status: Married    Spouse name: Not on file   Number of children: Not on  file   Years of education: Not on file   Highest education level: Not on file  Occupational History   Not on file  Tobacco Use   Smoking status: Never   Smokeless tobacco: Never  Vaping Use   Vaping Use: Never used  Substance and Sexual Activity   Alcohol use: No    Alcohol/week: 0.0 standard drinks of alcohol   Drug use: No   Sexual activity: Yes    Birth control/protection: None  Other Topics Concern   Not on file  Social History Narrative   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  Opioid Risk Score:   Fall Risk Score:  `1  Depression screen [facility] 2/9     06/09/2021   10:59 AM 10/24/2020    2:48 PM 08/26/2020    2:03 PM 07/22/2020    2:18 PM 04/07/2020    2:06 PM 04/02/2019    8:38 AM 02/09/2016    3:08 PM  Depression screen PHQ 2/9  Decreased Interest 0 1 1 1 1  0 0  Down, Depressed, Hopeless 0 2 1 1 1 1  0  PHQ - 2 Score 0 3 2 2 2 1  0  Altered sleeping  1   1 0   Tired, decreased energy  1   1 1    Change in appetite  0   0 0   Feeling [name] or failure about yourself   3   3 3    Trouble concentrating  3   3 2    Moving slowly or fidgety/restless  1   1 0   Suicidal thoughts  0   1 2   PHQ-9 Score  12   12 9    Difficult doing work/chores  Very difficult   Extremely dIfficult Somewhat difficult    Review of Systems  Constitutional: Negative.        Night sweats  HENT: Negative.    Eyes: Negative.   Respiratory: Negative.    Cardiovascular: Negative.   Gastrointestinal:  Positive for abdominal pain, constipation and diarrhea.  Endocrine: Negative.   Musculoskeletal:  Positive for arthralgias, back pain and neck pain.       Stomach and pelvic area  Skin: Negative.   Allergic/Immunologic: Negative.   Psychiatric/Behavioral:  The patient is nervous/anxious.   All other systems reviewed and are negative.      Objective:   Physical Exam Not performed  Assessment & Plan:  Brittany [name] is a 31 year old woman who presents for follow-up of pelvic floor dysfunction.  1) Pelvic floor dysfunction: -she takes magnesium citrate and even then  she cannot have complete evacuation. -encouraged rectal biofeedback today -discussed that MRI results are normal, back pain is likely ligamentous/muscular -discussed that she can request MRI records by calling medical records -continue pelvic floor PT, rectal biofeedback, progressive rectal dilation.  -once in a while  she has bowel incontinence -She has pelvic floor pain and has had great benefit from pelvic floor therapists.  -discussed pudendal nerve entrapment, prolotherapy. Provided link for further education.  -discussed TCA. -continue magnesium citrate.  -this has a huge impact on her quality of life.  -discussed anti-spasticity medications and Botox. Extensively discussed the benefits of Botox.  -Provided with a pain relief journal and discussed that it contains foods and lifestyle tips to naturally help to improve pain. Discussed that these lifestyle strategies are also very good for health unlike some medications which can have negative side effects. Discussed that the act of keeping a journal can be therapeutic and helpful to realize patterns what helps to trigger and alleviate pain.   -continue internal massaging -Discussed current symptoms of pain and history of pain.  -Discussed benefits of exercise in reducing pain. -Discussed following foods that may reduce pain: 1) Ginger (especially studied for arthritis)- reduce leukotriene production to decrease inflammation 2) Blueberries- high in phytonutrients that decrease inflammation 3) Salmon- marine omega-3s reduce joint swelling and pain 4) Pumpkin seeds- reduce inflammation 5) dark chocolate- reduces inflammation 6) turmeric- reduces inflammation 7) tart cherries - reduce pain and stiffness 8) extra virgin olive oil - its compound olecanthal helps to block prostaglandins  9) chili peppers- can be eaten or applied topically via capsaicin 10) mint- helpful for headache, muscle aches, joint pain, and itching 11) garlic- reduces  inflammation  Link to further information on diet for chronic pain: [email or web address]   2) Anxiety: -continue Lexapro -she is seeing a mental health therapist.  -continue valium suppositories -Discussed exercise and meditation as tools to decrease anxiety. -Recommended Down Dog Yoga app -Discussed spending time outdoors. -Discussed positive re-framing of anxiety.  -Discussed the following foods that have been show to reduce anxiety: 1) [place] nuts, mushrooms, soy beans due to their high selenium content. Upper limit of toxicity of selenium is 422mcg/day so no more than 3-4 [place] nuts per day.  2) Fatty fish such as salmon, mackerel, sardines, trout, and herring- high in omega-3 fatty acids 3) Eggs- increases serotonin and dopamine 4) Pumpkin seeds- high in omega-3 fatty acids 5) dark chocolate- high in flavanols that increase blood flow to brain 6) turmeric- take with black pepper to increase absorption 7) chamomile tea- antioxidant and anti-inflammatory properties 8) yogurt without sugar- supports gut-brain axis 9) green tea- contains L- theanine 10) blueberries- high in vitamin C and antioxidants 11) [place]- high in tryptophan which gets converted to serotonin 12) bell peppers- rich in vitamin C and antioxidants 13) citrus fruits- rich in vitamin C and antioxidants 14) almonds- high in vitamin E and healthy fats 15) chia seeds- high in omega-3 fatty acids  3) Sweating profusely at times: -could be autonomic dysfunction.   4) Hypermobility: -discussed with her that I will call as soon as EDS result returns.   5) Constipation: -continue mag citrate as needed, discussed its health benefits.   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   7) Visual disturbances  5 minutes in spent in discussion of her pelvic floor therapy, rectal biofeedback, discussed MRI results

## 2021-06-24 NOTE — Therapy (Signed)
OUTPATIENT PHYSICAL THERAPY TREATMENT NOTE   Patient Name: Brittany [name] MRN: [id number] DOB:[birth year], 31 y.o., female Today's Date: 06/24/2021  PCP: Inda Coke, PA REFERRING PROVIDER: Inda Coke, PA  END OF SESSION:   PT End of Session - 06/24/21 1027     Visit Number 14    Date for PT Re-Evaluation 07/07/21    Authorization Type [employer]    Authorization - Visit Number 14    Authorization - Number of Visits 37    PT Start Time 1027   arrived late   PT Stop Time 1100    PT Time Calculation (min) 33 min    Activity Tolerance Patient tolerated treatment well    Behavior During Therapy WFL for tasks assessed/performed             Past Medical History:  Diagnosis Date   Ankle injury    Anxiety    Eating disorder    Frequent headaches    Past Surgical History:  Procedure Laterality Date   ANKLE SURGERY  2018   HERNIA REPAIR     WISDOM TOOTH EXTRACTION     Patient Active Problem List   Diagnosis Date Noted   Positive ANA (antinuclear antibody) 10/19/2019   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   Anorexia nervosa, restricting type, in full remission, moderate 02/16/2018   History of depression 02/16/2018   GAD (generalized anxiety disorder) 12/16/2017   Pelvic floor dysfunction 12/16/2017   Right hip pain 11/02/2017   Grade 2 ankle sprain, right, initial encounter 05/16/2016   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 Seems to be back firing   PAIN:  Are you having pain? Yes:  NPRS scale: 5/10 Pain location: abdomen, rectum, tailbone, sacrum, hip Pain description: achy Aggravating factors: when not able to empty her rectum Relieving factors: massage   OBJECTIVE: (objective measures completed at initial evaluation unless otherwise dated)       GAIT: Comments: WFL   POSTURE:  Anterior pelvic tilt       PELVIC MMT:   MMT   04/14/2021 06/19/2021  Vaginal      Internal Anal Sphincter 3/5 3/5  External Anal Sphincter 2/5 2/5  Puborectalis 4/5 4/5  (Blank rows = not tested)         PALPATION:   General  tight lumbar paraspinals                 External Perineal Exam normal                             Internal Pelvic Floor TTP of sphincters and difficulty relaxing; can bulge partially; unable to palpate beyond puborectalis   TONE: high   PROLAPSE: None noted in supine   TODAY'S TREATMENT  06/24/2021 Neuro re-ed Biofeedback with internal sensor - able to relax after 2 minutes hips, happy baby increased tone, child pose slightly less, thunder bolt and pressing on perineum got the best results to consistently remain under 33m  06/22/2021 Manual: Soft tissue mobilization: to assess for dry needling. Manual work to right gluteal and obturator internist to elongate after dry needling. Manual work along the right SI joint, medial side of the ischial tuberosity.   Trigger Point Dry-Needling  Treatment instructions: Expect mild to moderate muscle soreness. S/S of pneumothorax if dry needled over a lung field, and to seek immediate medical attention should they occur. Patient verbalized understanding of these instructions and education.  Patient Consent Given: Yes Education handout provided: Previously provided Muscles treated: gluteus and the obturator on the right Electrical stimulation performed: No Parameters: N/A Treatment response/outcome: elongation of the muscle and trigger point response  Neuromuscular re-education: Toileting: sitting with  feet on squatty potty practicing breath work to push the stool out, verbal cues to use more force with breath, using sounds to help.  Self-care: Reviewed on how she is using the wand and dilators vaginally and rectally. Patient is able to push her rectal dilator out of the anus. Patient has to focus on relaxing to place the rectal dilator in.     06/19/2021 Manual: Soft tissue mobilization: to bilateral gluteus medius and lumbar paraspinals to elongate after dry needling Internal pelvic floor techniques:No emotional/communication barriers or cognitive limitation. Patient is motivated to learn. Patient understands and agrees with treatment goals and plan. PT explains patient will be examined in standing, sitting, and lying down to see how their muscles and joints work. When they are ready, they will be asked to remove their underwear so PT can examine their perineum. The patient is also given the option of providing their own chaperone as one is not provided in our facility. The patient also has the right and is explained the right to defer or refuse any part of the evaluation or treatment including the internal exam. With the patient's consent, PT will use one gloved finger to gently assess the muscles of the pelvic floor, seeing how well it contracts and relaxes and if there is muscle symmetry. After, the patient will get dressed and PT and patient will discuss exam findings and plan of care. PT and patient discuss plan of care, schedule, attendance policy and HEP activities.  Going through the anus to assess for dry needling and elongation of the muscles after dry needling. Manual work to the coccygeus, levator ani and obturator internist monitoring for pain, coccyx mobilization to move it to the left.  Trigger Point Dry-Needling  Treatment instructions: Expect mild to moderate muscle soreness. S/S of pneumothorax if dry needled over a lung field, and to seek immediate medical attention should they  occur. Patient verbalized understanding of these instructions and education.   Patient Consent Given: Yes Education handout provided: Previously provided Muscles treated: lumbar multifidi, gluteus medius, coccygeus, puborectalis Electrical stimulation performed: No Parameters: N/A Treatment response/outcome: elongation of tissue and trigger point response   Neuromuscular re-education: Down training:diaphragmatic breathing to push the therapist finger out of the anal canal and she was able to do it          HOME EXERCISE PROGRAM: Purchase choice of rectal neuro re-ed equipment to use in clinic and at home   ASSESSMENT:   CLINICAL IMPRESSION: Pt had internal sensor today and used with biofeedback for down training. Pt attempted variety of positions as mentioned above.  Pt noticed the most relaxation with pressure on the perineum.  Due to chronicity and difficulty of muscle control, pt is expected to need a lot of repetition with biofeedback down training for translation to  functional activities.  Pt will benefit from skilled PT for improved coordination for improved bowel movements.      OBJECTIVE IMPAIRMENTS decreased coordination, decreased endurance, decreased strength, increased muscle spasms, impaired flexibility, impaired tone, and pain.    ACTIVITY LIMITATIONS  toileting and interpersonal relationships .    PERSONAL FACTORS Time since onset of injury/illness/exacerbation and connective tissue disorder  are also affecting patient's functional outcome.      REHAB POTENTIAL: Good   CLINICAL DECISION MAKING: Evolving/moderate complexity   EVALUATION COMPLEXITY: Moderate     GOALS: Goals reviewed with patient? Yes   SHORT TERM GOALS: Target date: 05/13/2021   Pt will have either dilator or internal sensor to use for treatment Baseline: has both Goal status: MET     LONG TERM GOALS: Target date: 08/04/21   Pt will be able to have at least 3 days / week where she has a  good bowel movement Baseline: not at this time, 06/10/2021 Goal status: ongoing   2.  Pt will be independent with advanced HEP to maintain improvements made throughout therapy  Baseline: Still learning as she progresses 06/10/2021 Goal status: Ongoing   3.  Pt will report 50% less abdominal bloating and discomfort due to improved muscle tone throughout the core  Baseline: Bloating is off and on with not changes at this time, 06/10/2021 Goal status: ongoing   4.  Pt will report 50% reduction of pain due to improvements in posture, strength, and muscle length  Baseline: not at this time, 06/10/2021 Goal status: Ongoing       PLAN: PT FREQUENCY: 1x/week   PT DURATION: other: 16 (reducing frequency as able)   PLANNED INTERVENTIONS: Therapeutic exercises, Therapeutic activity, Neuromuscular re-education, Balance training, Gait training, Patient/Family education, Joint mobilization, Dry Needling, Electrical stimulation, Cryotherapy, Moist heat, Taping, Biofeedback, and Manual therapy   PLAN FOR NEXT SESSION: down training with internal anal sensor; continue with dry needling if helping     [employer], PT 06/24/21 10:37 AM

## 2021-06-29 ENCOUNTER — Ambulatory Visit: Payer: 59 | Admitting: Physical Therapy

## 2021-06-29 DIAGNOSIS — R278 Other lack of coordination: Secondary | ICD-10-CM

## 2021-06-29 DIAGNOSIS — R252 Cramp and spasm: Secondary | ICD-10-CM

## 2021-07-01 ENCOUNTER — Ambulatory Visit: Payer: 59 | Admitting: Physical Therapy

## 2021-07-01 ENCOUNTER — Encounter: Payer: Self-pay | Admitting: Physical Therapy

## 2021-07-01 DIAGNOSIS — R278 Other lack of coordination: Secondary | ICD-10-CM | POA: Diagnosis not present

## 2021-07-01 DIAGNOSIS — R252 Cramp and spasm: Secondary | ICD-10-CM

## 2021-07-01 NOTE — Therapy (Signed)
OUTPATIENT PHYSICAL THERAPY TREATMENT NOTE   Patient Name: Brittany [name] MRN: [id number] DOB:1990-06-28, 31 y.o., female Today's Date: 07/01/2021  PCP: Inda Coke, PA REFERRING PROVIDER: Inda Coke, PA  END OF SESSION:   PT End of Session - 07/01/21 1458     Visit Number 16    Date for PT Re-Evaluation 07/07/21    Authorization Type [employer]    Authorization - Visit Number 16    Authorization - Number of Visits 60    PT Start Time 4098    PT Stop Time 1525    PT Time Calculation (min) 26 min    Activity Tolerance Patient tolerated treatment well    Behavior During Therapy WFL for tasks assessed/performed             Past Medical History:  Diagnosis Date   Ankle injury    Anxiety    Eating disorder    Frequent headaches    Past Surgical History:  Procedure Laterality Date   ANKLE SURGERY  2018   HERNIA REPAIR     WISDOM TOOTH EXTRACTION     Patient Active Problem List   Diagnosis Date Noted   Positive ANA (antinuclear antibody) 10/19/2019   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   Anorexia nervosa, restricting type, in full remission, moderate 02/16/2018   History of depression 02/16/2018   GAD (generalized anxiety disorder) 12/16/2017   Pelvic floor dysfunction 12/16/2017   Right hip pain 11/02/2017   Grade 2 ankle sprain, right, initial encounter 05/16/2016   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 Patient not having as much sharp pain.   PAIN:  Are you having pain? Yes: NPRS scale: 5/10 Pain location: abdomen, rectum, tailbone, sacrum,  hip Pain description: achy Aggravating factors: when not able to empty her rectum Relieving factors: massage   OBJECTIVE: (objective measures completed at initial evaluation unless otherwise dated)       GAIT: Comments: WFL   POSTURE:  Anterior pelvic tilt       PELVIC MMT:   MMT   04/14/2021 06/19/2021  Vaginal      Internal Anal Sphincter 3/5 3/5  External Anal Sphincter 2/5 2/5  Puborectalis 4/5 4/5  (Blank rows = not tested)         PALPATION:   General  tight lumbar paraspinals                 External Perineal Exam normal                             Internal Pelvic Floor TTP of sphincters and difficulty relaxing; can bulge partially; unable to palpate beyond puborectalis   TONE: high   PROLAPSE: None noted in supine   TODAY'S TREATMENT  07/01/2021 Manual: Soft tissue mobilization:to assess for dry needling. Manual work to bilateral hamstring, hip adductors, gluteals, and piriformis to elongate after dry needling.  Trigger Point Dry-Needling  Treatment instructions: Expect mild to moderate muscle soreness. S/S of pneumothorax if dry needled over a lung field, and to seek immediate medical  attention should they occur. Patient verbalized understanding of these instructions and education.  Patient Consent Given: Yes Education handout provided: Previously provided Muscles treated: adductors,hamstring, gluteals, piriformis Electrical stimulation performed: No Parameters: N/A Treatment response/outcome: elongation of the muscle and trigger point response      06/29/2021 Neuro re-ed Biofeedback with internal sensor: able to relax after 2 minutes hips in the air in rock pose - able to set bar for consistently remain under 1m (down from 859mpreviously Other positions - sitting on towels Child pose Deep squat   06/24/2021 Neuro re-ed Biofeedback with internal sensor - able to relax after 2 minutes hips, happy baby increased tone, child pose slightly less,  thunder bolt and pressing on perineum got the best results to consistently remain under 105m62m 06/22/2021 Manual: Soft tissue mobilization: to assess for dry needling. Manual work to right gluteal and obturator internist to elongate after dry needling. Manual work along the right SI joint, medial side of the ischial tuberosity.   Trigger Point Dry-Needling  Treatment instructions: Expect mild to moderate muscle soreness. S/S of pneumothorax if dry needled over a lung field, and to seek immediate medical attention should they occur. Patient verbalized understanding of these instructions and education.   Patient Consent Given: Yes Education handout provided: Previously provided Muscles treated: gluteus and the obturator on the right Electrical stimulation performed: No Parameters: N/A Treatment response/outcome: elongation of the muscle and trigger point response   Neuromuscular re-education: Toileting: sitting with feet on squatty potty practicing breath work to push the stool out, verbal cues to use more force with breath, using sounds to help.  Self-care: Reviewed on how she is using the wand and dilators vaginally and rectally. Patient is able to push her rectal dilator out of the anus. Patient has to focus on relaxing to place the rectal dilator in.        HOME EXERCISE PROGRAM: Purchase choice of rectal neuro re-ed equipment to use in clinic and at home   ASSESSMENT:   CLINICAL IMPRESSION: Pt had internal sensor today and used with biofeedback for down training. Patient is able to use her dilator now. She reports her pain is staying at a lower level majority of the time. Patient responded well to the dry needling. Pt will benefit from skilled PT for improved coordination for improved bowel movements.      OBJECTIVE IMPAIRMENTS decreased coordination, decreased endurance, decreased strength, increased muscle spasms, impaired flexibility, impaired tone, and pain.    ACTIVITY  LIMITATIONS  toileting and interpersonal relationships .    PERSONAL FACTORS Time since onset of injury/illness/exacerbation and connective tissue disorder  are also affecting patient's functional outcome.      REHAB POTENTIAL: Good   CLINICAL DECISION MAKING: Evolving/moderate complexity   EVALUATION COMPLEXITY: Moderate     GOALS: Goals reviewed with patient? Yes   SHORT TERM GOALS: Target date: 05/13/2021   Pt will have either dilator or internal sensor to use for treatment Baseline: has both Goal status: MET     LONG TERM GOALS: Target date: 08/04/21   All goals updated 06/29/21   Pt will be able to have at least 3 days / week where she has a good bowel movement Baseline:   Goal status: ongoing   2.  Pt will be independent with advanced HEP to maintain improvements made throughout therapy  Baseline:   Goal status: Ongoing   3.  Pt will report 50% less abdominal bloating and discomfort due to improved muscle  tone throughout the core  Baseline:  Goal status: ongoing   4.  Pt will report 50% reduction of pain due to improvements in posture, strength, and muscle length  Baseline:  Goal status: Ongoing       PLAN: PT FREQUENCY: 1x/week   PT DURATION: other: 16 (reducing frequency as able)   PLANNED INTERVENTIONS: Therapeutic exercises, Therapeutic activity, Neuromuscular re-education, Balance training, Gait training, Patient/Family education, Joint mobilization, Dry Needling, Electrical stimulation, Cryotherapy, Moist heat, Taping, Biofeedback, and Manual therapy   PLAN FOR NEXT SESSION: down training with internal anal sensor; need to write renewal., she has an appointment on 7/10 with Anaiah Mcmannis for dry needling but should be canceled if not needed.    Earlie Counts, PT 07/01/21 3:29 PM

## 2021-07-06 ENCOUNTER — Ambulatory Visit: Payer: 59 | Admitting: Physical Therapy

## 2021-07-10 ENCOUNTER — Ambulatory Visit: Payer: 59 | Attending: Physician Assistant | Admitting: Physical Therapy

## 2021-07-10 DIAGNOSIS — R278 Other lack of coordination: Secondary | ICD-10-CM | POA: Diagnosis present

## 2021-07-10 DIAGNOSIS — R252 Cramp and spasm: Secondary | ICD-10-CM | POA: Diagnosis present

## 2021-07-10 NOTE — Therapy (Signed)
OUTPATIENT PHYSICAL THERAPY TREATMENT NOTE   Patient Name: Brittany [name] MRN: [id number] DOB:November 03, 1990, 31 y.o., female Today's Date: 07/10/2021  PCP: Inda Coke, PA REFERRING PROVIDER: Inda Coke, PA  END OF SESSION:   PT End of Session - 07/10/21 1027     Visit Number 17    Date for PT Re-Evaluation 10/02/21    Authorization Type [employer]    Authorization - Visit Number 17    Authorization - Number of Visits 78    PT Start Time 0034   arrived late   PT Stop Time 1100    PT Time Calculation (min) 32 min    Activity Tolerance Patient tolerated treatment well    Behavior During Therapy WFL for tasks assessed/performed              Past Medical History:  Diagnosis Date   Ankle injury    Anxiety    Eating disorder    Frequent headaches    Past Surgical History:  Procedure Laterality Date   ANKLE SURGERY  2018   HERNIA REPAIR     WISDOM TOOTH EXTRACTION     Patient Active Problem List   Diagnosis Date Noted   Positive ANA (antinuclear antibody) 10/19/2019   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   Anorexia nervosa, restricting type, in full remission, moderate 02/16/2018   History of depression 02/16/2018   GAD (generalized anxiety disorder) 12/16/2017   Pelvic floor dysfunction 12/16/2017   Right hip pain 11/02/2017   Grade 2 ankle sprain, right, initial encounter 05/16/2016   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  I feel like it might still be helping. PAIN:  Are you having pain? Yes NPRS scale: 5/10 Pain location: abdomen Pain  orientation: Lower  PAIN TYPE: cramping, encompassing Pain description: constant  Aggravating factors: haven't had a complete bowel movement  Relieving factors: having a full bowel movement     OBJECTIVE: (objective measures completed at initial evaluation unless otherwise dated)       GAIT: Comments: WFL   POSTURE:  Anterior pelvic tilt       PELVIC MMT:   MMT   04/14/2021 06/19/2021  Vaginal      Internal Anal Sphincter 3/5 3/5  External Anal Sphincter 2/5 2/5  Puborectalis 4/5 4/5  (Blank rows = not tested)         PALPATION:   General  tight lumbar paraspinals                 External Perineal Exam normal                             Internal Pelvic Floor TTP of sphincters and difficulty relaxing; can bulge partially; unable to palpate beyond puborectalis   TONE: high   PROLAPSE: None noted in supine   TODAY'S TREATMENT  07/10/2021 Neuro re-ed Biofeedback with internal sensor: able to relax after 2 minutes hips in the air in rock pose - able to set bar for consistently remain under 64m (down from 858mpreviously Other positions -  sitting with squatty potty Child pose Deep squat  07/01/2021 Manual: Soft tissue mobilization:to assess for dry needling. Manual work to bilateral hamstring, hip adductors, gluteals, and piriformis to elongate after dry needling.  Trigger Point Dry-Needling  Treatment instructions: Expect mild to moderate muscle soreness. S/S of pneumothorax if dry needled over a lung field, and to seek immediate medical attention should they occur. Patient verbalized understanding of these instructions and education.  Patient Consent Given: Yes Education handout provided: Previously provided Muscles treated: adductors,hamstring, gluteals, piriformis Electrical stimulation performed: No Parameters: N/A Treatment response/outcome: elongation of the muscle and trigger point response      06/29/2021 Neuro re-ed Biofeedback with internal sensor:  able to relax after 2 minutes hips in the air in rock pose - able to set bar for consistently remain under 49m (down from 862mpreviously Other positions - sitting on towels Child pose Deep squat          HOME EXERCISE PROGRAM: Purchase choice of rectal neuro re-ed equipment to use in clinic and at home   ASSESSMENT:   CLINICAL IMPRESSION: Pt had internal sensor today and used with biofeedback for down training. Pt is making good progress with biofeedback. She is able to get the muscles under 51m27m When sitting today she was able to demonstrate muscles relaxing with gentle push, but muscles recoiled to higher resting tone.  Pt did well with repetition of working on keeping a lower resting tone after pushing using biofeedback.  Pt will benefit from continued skilled PT as she is expected to continue to make progress with improved coordination.  She has been seeing results with treatment, but she needs biofeedback and other skilled techniques to help with this continued progression     OBJECTIVE IMPAIRMENTS decreased coordination, decreased endurance, decreased strength, increased muscle spasms, impaired flexibility, impaired tone, and pain.    ACTIVITY LIMITATIONS  toileting and interpersonal relationships .    PERSONAL FACTORS Time since onset of injury/illness/exacerbation and connective tissue disorder  are also affecting patient's functional outcome.      REHAB POTENTIAL: Good   CLINICAL DECISION MAKING: Evolving/moderate complexity   EVALUATION COMPLEXITY: Moderate     GOALS: Goals reviewed with patient? Yes   SHORT TERM GOALS: Target date: 05/13/2021   Pt will have either dilator or internal sensor to use for treatment Baseline: has both Goal status: MET     LONG TERM GOALS: Target date: 10/02/21   All goals updated 07/10/21    Pt will be able to have at least 3 days / week where she has a good bowel movement Baseline:   Goal status: ongoing   2.  Pt will be  independent with advanced HEP to maintain improvements made throughout therapy  Baseline:   Goal status: Ongoing   3.  Pt will report 50% less abdominal bloating and discomfort due to improved muscle tone throughout the core  Baseline:  Goal status: ongoing   4.  Pt will report 50% reduction of pain due to improvements in posture, strength, and muscle length  Baseline:  Goal status: Ongoing   5. Pt will be able to have a bowel movement in the morning with it only taking 2-3 hours in the morning and feeling relief for the rest of the day     PLAN: PT FREQUENCY: 1x/week   PT DURATION: other: 16 (reducing frequency as able)   PLANNED INTERVENTIONS: Therapeutic exercises, Therapeutic activity, Neuromuscular re-education, Balance training, Gait training, Patient/Family education, Joint mobilization, Dry Needling,  Electrical stimulation, Cryotherapy, Moist heat, Taping, Biofeedback, and Manual therapy   PLAN FOR NEXT SESSION: down training with internal anal sensor; dry needling and re-assess if needs more dry needling     [employer], PT 07/10/21 11:18 AM

## 2021-07-13 ENCOUNTER — Ambulatory Visit: Payer: 59 | Admitting: Physical Therapy

## 2021-07-13 ENCOUNTER — Encounter: Payer: Self-pay | Admitting: Physical Therapy

## 2021-07-13 DIAGNOSIS — R278 Other lack of coordination: Secondary | ICD-10-CM | POA: Diagnosis not present

## 2021-07-13 DIAGNOSIS — R252 Cramp and spasm: Secondary | ICD-10-CM

## 2021-07-13 NOTE — Therapy (Signed)
OUTPATIENT PHYSICAL THERAPY TREATMENT NOTE   Patient Name: Brittany [name] MRN: [id number] DOB:[birth year], 31 y.o., female Today's Date: 07/13/2021  PCP: Inda Coke, PA REFERRING PROVIDER: Inda Coke, PA  END OF SESSION:   PT End of Session - 07/13/21 1623     Visit Number 18    Date for PT Re-Evaluation 10/02/21    Authorization Type [employer]    Authorization - Visit Number 18    Authorization - Number of Visits 15    PT Start Time 4401   came late and had to go the bathroom   PT Stop Time 1702    PT Time Calculation (min) 38 min    Activity Tolerance Patient tolerated treatment well    Behavior During Therapy [facility] for tasks assessed/performed             Past Medical History:  Diagnosis Date   Ankle injury    Anxiety    Eating disorder    Frequent headaches    Past Surgical History:  Procedure Laterality Date   ANKLE SURGERY  2018   HERNIA REPAIR     WISDOM TOOTH EXTRACTION     Patient Active Problem List   Diagnosis Date Noted   Positive ANA (antinuclear antibody) 10/19/2019   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   Anorexia nervosa, restricting type, in full remission, moderate 02/16/2018   History of depression 02/16/2018   GAD (generalized anxiety disorder) 12/16/2017   Pelvic floor dysfunction 12/16/2017   Right hip pain 11/02/2017   Grade 2 ankle sprain, right, initial encounter 05/16/2016   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 I feel like I have jaw issues.  PAIN:  Are you having pain? Yes NPRS scale: 6/10 Pain  location: abdomen Pain orientation: Lower  PAIN TYPE: cramping, encompassing Pain description: constant  Aggravating factors: haven't had a complete bowel movement  Relieving factors: having a full bowel movement      OBJECTIVE: (objective measures completed at initial evaluation unless otherwise dated)       GAIT: Comments: WFL   POSTURE:  Anterior pelvic tilt       PELVIC MMT:   MMT   04/14/2021 06/19/2021  Vaginal      Internal Anal Sphincter 3/5 3/5  External Anal Sphincter 2/5 2/5  Puborectalis 4/5 4/5  (Blank rows = not tested)         PALPATION:   General  tight lumbar paraspinals                 External Perineal Exam normal                             Internal Pelvic Floor TTP of sphincters and difficulty relaxing; can bulge partially; unable to palpate beyond puborectalis   TONE: high   PROLAPSE: None noted in supine   TODAY'S TREATMENT  07/13/2021 Manual: Soft tissue mobilization: to assess for dry needling Internal pelvic floor techniques:No emotional/communication barriers or cognitive limitation. Patient is motivated to learn. Patient understands and agrees with treatment goals  and plan. PT explains patient will be examined in standing, sitting, and lying down to see how their muscles and joints work. When they are ready, they will be asked to remove their underwear so PT can examine their perineum. The patient is also given the option of providing their own chaperone as one is not provided in our facility. The patient also has the right and is explained the right to defer or refuse any part of the evaluation or treatment including the internal exam. With the patient's consent, PT will use one gloved finger to gently assess the muscles of the pelvic floor, seeing how well it contracts and relaxes and if there is muscle symmetry. After, the patient will get dressed and PT and patient will discuss exam findings and plan of care. PT and patient discuss plan of  care, schedule, attendance policy and HEP activities. Going through the anus performing manual work to the puborectalis, anococcygeal ligament, coccygeus, and iliococcygeus and obturator internist to elongate after dry needling and reduce her tightness.   Trigger Point Dry-Needling  Treatment instructions: Expect mild to moderate muscle soreness. S/S of pneumothorax if dry needled over a lung field, and to seek immediate medical attention should they occur. Patient verbalized understanding of these instructions and education. Patient Consent Given: Yes Education handout provided: Previously provided Muscles treated: puborectalis, coccygeus, iliococcygeus Electrical stimulation performed: No Parameters: N/A Treatment response/outcome: elongation of muscles and trigger point responsea  Neuromuscular re-education: Down training:patient was trying to push the therapist finger out of the anal canal and was able to do it    07/10/2021 Neuro re-ed Biofeedback with internal sensor: able to relax after 2 minutes hips in the air in rock pose - able to set bar for consistently remain under 741m (down from 829mpreviously Other positions - sitting with squatty potty Child pose Deep squat   07/01/2021 Manual: Soft tissue mobilization:to assess for dry needling. Manual work to bilateral hamstring, hip adductors, gluteals, and piriformis to elongate after dry needling.  Trigger Point Dry-Needling  Treatment instructions: Expect mild to moderate muscle soreness. S/S of pneumothorax if dry needled over a lung field, and to seek immediate medical attention should they occur. Patient verbalized understanding of these instructions and education.   Patient Consent Given: Yes Education handout provided: Previously provided Muscles treated: adductors,hamstring, gluteals, piriformis Electrical stimulation performed: No Parameters: N/A Treatment response/outcome: elongation of the muscle and trigger point  response         06/29/2021 Neuro re-ed Biofeedback with internal sensor: able to relax after 2 minutes hips in the air in rock pose - able to set bar for consistently remain under 41m90mdown from [id number] Other positions - sitting on towels Child pose Deep squat    HOME EXERCISE PROGRAM: Purchase choice of rectal neuro re-ed equipment to use in clinic and at home   ASSESSMENT:   CLINICAL IMPRESSION: Patient continued to have pelvic floor muscle tightness after the dry needling. She was able to push the therapist finger out of the anal canal. Patient is still having issue with toileting and will stay in the bathroom more than 10 minutes. She reports sometimes she has a very small stool or a bigger one. Patient continues to have a very sensitive GI system and rectum. Patient will benefit from skilled therapy to improve her pushing stool out so she is able to fully empty her rectum.     OBJECTIVE IMPAIRMENTS decreased coordination, decreased endurance, decreased strength, increased muscle spasms, impaired flexibility,  impaired tone, and pain.    ACTIVITY LIMITATIONS  toileting and interpersonal relationships .    PERSONAL FACTORS Time since onset of injury/illness/exacerbation and connective tissue disorder  are also affecting patient's functional outcome.      REHAB POTENTIAL: Good   CLINICAL DECISION MAKING: Evolving/moderate complexity   EVALUATION COMPLEXITY: Moderate     GOALS: Goals reviewed with patient? Yes   SHORT TERM GOALS: Target date: 05/13/2021   Pt will have either dilator or internal sensor to use for treatment Baseline: has both Goal status: MET     LONG TERM GOALS: Target date: 10/02/21   All goals updated 07/10/21    Pt will be able to have at least 3 days / week where she has a good bowel movement Baseline:   Goal status: ongoing   2.  Pt will be independent with advanced HEP to maintain improvements made throughout therapy  Baseline:   Goal  status: Ongoing   3.  Pt will report 50% less abdominal bloating and discomfort due to improved muscle tone throughout the core  Baseline:  Goal status: ongoing   4.  Pt will report 50% reduction of pain due to improvements in posture, strength, and muscle length  Baseline:  Goal status: Ongoing   5. Pt will be able to have a bowel movement in the morning with it only taking 2-3 hours in the morning and feeling relief for the rest of the day     PLAN: PT FREQUENCY: 1x/week   PT DURATION: other: 16 (reducing frequency as able)   PLANNED INTERVENTIONS: Therapeutic exercises, Therapeutic activity, Neuromuscular re-education, Balance training, Gait training, Patient/Family education, Joint mobilization, Dry Needling, Electrical stimulation, Cryotherapy, Moist heat, Taping, Biofeedback, and Manual therapy   PLAN FOR NEXT SESSION: down training with internal anal sensor; work on jaw relaxation while toileting  Earlie Counts, PT 07/13/21 5:07 PM

## 2021-07-16 NOTE — Therapy (Unsigned)
OUTPATIENT PHYSICAL THERAPY TREATMENT NOTE   Patient Name: Brittany [name] MRN: [id number] DOB:[birth year], 31 y.o., female Today's Date: 07/17/2021  PCP: Inda Coke, PA REFERRING PROVIDER: Inda Coke, PA  END OF SESSION:   PT End of Session - 07/17/21 1111     Visit Number 19    Date for PT Re-Evaluation 10/02/21    Authorization Type [employer]    Authorization - Visit Number 19    Authorization - Number of Visits 37    PT Start Time 1111   late   PT Stop Time 1150    PT Time Calculation (min) 39 min    Activity Tolerance Patient tolerated treatment well    Behavior During Therapy WFL for tasks assessed/performed              Past Medical History:  Diagnosis Date   Ankle injury    Anxiety    Eating disorder    Frequent headaches    Past Surgical History:  Procedure Laterality Date   ANKLE SURGERY  2018   HERNIA REPAIR     WISDOM TOOTH EXTRACTION     Patient Active Problem List   Diagnosis Date Noted   Positive ANA (antinuclear antibody) 10/19/2019   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   Anorexia nervosa, restricting type, in full remission, moderate 02/16/2018   History of depression 02/16/2018   GAD (generalized anxiety disorder) 12/16/2017   Pelvic floor dysfunction 12/16/2017   Right hip pain 11/02/2017   Grade 2 ankle sprain, right, initial encounter 05/16/2016   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  I know that my ocd is causing some of it.  I spend 10 min on the toilet then a little might come out then the  whole process starts all over   PAIN:  Are you having pain? Yes NPRS scale: 6/10 Pain location: abdomen Pain orientation: Lower  PAIN TYPE: cramping, encompassing Pain description: constant  Aggravating factors: haven't had a complete bowel movement  Relieving factors: having a full bowel movement      OBJECTIVE: (objective measures completed at initial evaluation unless otherwise dated)       GAIT: Comments: WFL   POSTURE:  Anterior pelvic tilt       PELVIC MMT:   MMT   04/14/2021 06/19/2021  Vaginal      Internal Anal Sphincter 3/5 3/5  External Anal Sphincter 2/5 2/5  Puborectalis 4/5 4/5  (Blank rows = not tested)         PALPATION:   General  tight lumbar paraspinals                 External Perineal Exam normal                             Internal Pelvic Floor TTP of sphincters and difficulty relaxing; can bulge partially; unable to palpate beyond puborectalis   TONE: high   PROLAPSE: None noted in supine   TODAY'S TREATMENT  07/17/2021 Neuro re-ed Biofeedback with internal sensor: deep  squat, sitting simulating toileting Self care: discussed ways to reduce time on the toilet - pt agrees to 2x after each meal for no more than 10 min  07/13/2021 Manual: Soft tissue mobilization: to assess for dry needling Internal pelvic floor techniques:No emotional/communication barriers or cognitive limitation. Patient is motivated to learn. Patient understands and agrees with treatment goals and plan. PT explains patient will be examined in standing, sitting, and lying down to see how their muscles and joints work. When they are ready, they will be asked to remove their underwear so PT can examine their perineum. The patient is also given the option of providing their own chaperone as one is not provided in our facility. The patient also has the right and is explained the right to defer or refuse any part of the evaluation or treatment including the internal exam. With  the patient's consent, PT will use one gloved finger to gently assess the muscles of the pelvic floor, seeing how well it contracts and relaxes and if there is muscle symmetry. After, the patient will get dressed and PT and patient will discuss exam findings and plan of care. PT and patient discuss plan of care, schedule, attendance policy and HEP activities. Going through the anus performing manual work to the puborectalis, anococcygeal ligament, coccygeus, and iliococcygeus and obturator internist to elongate after dry needling and reduce her tightness.   Trigger Point Dry-Needling  Treatment instructions: Expect mild to moderate muscle soreness. S/S of pneumothorax if dry needled over a lung field, and to seek immediate medical attention should they occur. Patient verbalized understanding of these instructions and education. Patient Consent Given: Yes Education handout provided: Previously provided Muscles treated: puborectalis, coccygeus, iliococcygeus Electrical stimulation performed: No Parameters: N/A Treatment response/outcome: elongation of muscles and trigger point responsea  Neuromuscular re-education: Down training:patient was trying to push the therapist finger out of the anal canal and was able to do it    07/10/2021 Neuro re-ed Biofeedback with internal sensor: able to relax after 2 minutes hips in the air in rock pose - able to set bar for consistently remain under 89m (down from 845mpreviously Other positions - sitting with squatty potty Child pose Deep squat    HOME EXERCISE PROGRAM: Purchase choice of rectal neuro re-ed equipment to use in clinic and at home   ASSESSMENT:   CLINICAL IMPRESSION: Patient is still doing better at relaxing with using sensor.  She had a hard time getting the sensor out with pushing today.  She did this at the end of the treatment.  A lot of education given about retraining the bowels and not sitting on the toilet as long Pt will benefit from  skilled PT.  She is expected to progress slowly due to complication from anxiety and OCD.     OBJECTIVE IMPAIRMENTS decreased coordination, decreased endurance, decreased strength, increased muscle spasms, impaired flexibility, impaired tone, and pain.    ACTIVITY LIMITATIONS  toileting and interpersonal relationships .    PERSONAL FACTORS Time since onset of injury/illness/exacerbation and connective tissue disorder  are also affecting patient's functional outcome.      REHAB POTENTIAL: Good   CLINICAL DECISION MAKING: Evolving/moderate complexity   EVALUATION COMPLEXITY: Moderate     GOALS: Goals reviewed with patient? Yes   SHORT TERM GOALS: Target date: 05/13/2021   Pt will have either dilator or internal sensor to use for treatment Baseline: has both Goal status: MET     LONG TERM GOALS: Target date: 10/02/21  All goals updated 07/10/21    Pt will be able to have at least 3 days / week where she has a good bowel movement Baseline:   Goal status: ongoing   2.  Pt will be independent with advanced HEP to maintain improvements made throughout therapy  Baseline:   Goal status: Ongoing   3.  Pt will report 50% less abdominal bloating and discomfort due to improved muscle tone throughout the core  Baseline:  Goal status: ongoing   4.  Pt will report 50% reduction of pain due to improvements in posture, strength, and muscle length  Baseline:  Goal status: Ongoing   5. Pt will be able to have a bowel movement in the morning with it only taking 2-3 hours in the morning and feeling relief for the rest of the day     PLAN: PT FREQUENCY: 1x/week   PT DURATION: other: 16 (reducing frequency as able)   PLANNED INTERVENTIONS: Therapeutic exercises, Therapeutic activity, Neuromuscular re-education, Balance training, Gait training, Patient/Family education, Joint mobilization, Dry Needling, Electrical stimulation, Cryotherapy, Moist heat, Taping, Biofeedback, and Manual  therapy   PLAN FOR NEXT SESSION: work on jaw relaxation while toileting, craniosacral ; down training  [employer], PT 07/17/21 11:50 AM

## 2021-07-17 ENCOUNTER — Ambulatory Visit: Payer: 59 | Admitting: Physical Therapy

## 2021-07-17 DIAGNOSIS — R252 Cramp and spasm: Secondary | ICD-10-CM

## 2021-07-17 DIAGNOSIS — R278 Other lack of coordination: Secondary | ICD-10-CM

## 2021-07-23 ENCOUNTER — Ambulatory Visit: Payer: 59 | Admitting: Physical Therapy

## 2021-07-23 ENCOUNTER — Encounter: Payer: Self-pay | Admitting: Physical Therapy

## 2021-07-23 DIAGNOSIS — R278 Other lack of coordination: Secondary | ICD-10-CM | POA: Diagnosis not present

## 2021-07-23 DIAGNOSIS — R252 Cramp and spasm: Secondary | ICD-10-CM

## 2021-07-23 NOTE — Therapy (Signed)
OUTPATIENT PHYSICAL THERAPY TREATMENT NOTE   Patient Name: Brittany [name] MRN: [id number] DOB:1990-04-24, 31 y.o., female Today's Date: 07/23/2021  PCP: Inda Coke, PA REFERRING PROVIDER: Inda Coke, PA  END OF SESSION:   PT End of Session - 07/23/21 1726     Visit Number 20    Date for PT Re-Evaluation 10/02/21    Authorization Type [employer]    Authorization - Visit Number 20    Authorization - Number of Visits 60    PT Start Time 9169    PT Stop Time 1702    PT Time Calculation (min) 41 min    Activity Tolerance Patient tolerated treatment well    Behavior During Therapy WFL for tasks assessed/performed               Past Medical History:  Diagnosis Date   Ankle injury    Anxiety    Eating disorder    Frequent headaches    Past Surgical History:  Procedure Laterality Date   ANKLE SURGERY  2018   HERNIA REPAIR     WISDOM TOOTH EXTRACTION     Patient Active Problem List   Diagnosis Date Noted   Positive ANA (antinuclear antibody) 10/19/2019   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   Anorexia nervosa, restricting type, in full remission, moderate 02/16/2018   History of depression 02/16/2018   GAD (generalized anxiety disorder) 12/16/2017   Pelvic floor dysfunction 12/16/2017   Right hip pain 11/02/2017   Grade 2 ankle sprain, right, initial encounter 05/16/2016   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  Feeling tight after swimming.   PAIN:  Are you having pain? Yes NPRS scale: 6/10 Pain location: abdomen Pain orientation: Lower  PAIN TYPE:  cramping, encompassing Pain description: constant  Aggravating factors: haven't had a complete bowel movement  Relieving factors: having a full bowel movement      OBJECTIVE: (objective measures completed at initial evaluation unless otherwise dated)       GAIT: Comments: WFL   POSTURE:  Anterior pelvic tilt       PELVIC MMT:   MMT   04/14/2021 06/19/2021  Vaginal      Internal Anal Sphincter 3/5 3/5  External Anal Sphincter 2/5 2/5  Puborectalis 4/5 4/5  (Blank rows = not tested)         PALPATION:   General  tight lumbar paraspinals                 External Perineal Exam normal                             Internal Pelvic Floor TTP of sphincters and difficulty relaxing; can bulge partially; unable to palpate beyond puborectalis   TONE: high   PROLAPSE: None noted in supine   TODAY'S TREATMENT  07/23/2021 Neuro re-ed Biofeedback with internal sensor: deep squat, sitting simulating toileting, pushing out probe Manual Shoulder girdle, masseter, sublingual, suboccipitals, cranial fascia and hyoid fascia all released to level tissues will allow  07/17/2021 Neuro re-ed Biofeedback with internal sensor: deep squat,  sitting simulating toileting Self care: discussed ways to reduce time on the toilet - pt agrees to 2x after each meal for no more than 10 min  07/13/2021 Manual: Soft tissue mobilization: to assess for dry needling Internal pelvic floor techniques:No emotional/communication barriers or cognitive limitation. Patient is motivated to learn. Patient understands and agrees with treatment goals and plan. PT explains patient will be examined in standing, sitting, and lying down to see how their muscles and joints work. When they are ready, they will be asked to remove their underwear so PT can examine their perineum. The patient is also given the option of providing their own chaperone as one is not provided in our facility. The patient also has the right and is  explained the right to defer or refuse any part of the evaluation or treatment including the internal exam. With the patient's consent, PT will use one gloved finger to gently assess the muscles of the pelvic floor, seeing how well it contracts and relaxes and if there is muscle symmetry. After, the patient will get dressed and PT and patient will discuss exam findings and plan of care. PT and patient discuss plan of care, schedule, attendance policy and HEP activities. Going through the anus performing manual work to the puborectalis, anococcygeal ligament, coccygeus, and iliococcygeus and obturator internist to elongate after dry needling and reduce her tightness.   Trigger Point Dry-Needling  Treatment instructions: Expect mild to moderate muscle soreness. S/S of pneumothorax if dry needled over a lung field, and to seek immediate medical attention should they occur. Patient verbalized understanding of these instructions and education. Patient Consent Given: Yes Education handout provided: Previously provided Muscles treated: puborectalis, coccygeus, iliococcygeus Electrical stimulation performed: No Parameters: N/A Treatment response/outcome: elongation of muscles and trigger point responsea  Neuromuscular re-education: Down training:patient was trying to push the therapist finger out of the anal canal and was able to do it      ASSESSMENT:   CLINICAL IMPRESSION: Patient did well with relaxing today and was able to even push sensor out without much difficulty which previously has been very challenging.  Today more focus was spent on relaxing the jaw as there is a correclation shown between pelvic floor tension and jaw clenching.  Pt has a lot of muscle twitching  but did relax with STM and using craniosacral techniques  continue to need skilled PT for improved muscle coordination.   OBJECTIVE IMPAIRMENTS decreased coordination, decreased endurance, decreased strength, increased muscle spasms,  impaired flexibility, impaired tone, and pain.    ACTIVITY LIMITATIONS  toileting and interpersonal relationships .    PERSONAL FACTORS Time since onset of injury/illness/exacerbation and connective tissue disorder  are also affecting patient's functional outcome.      REHAB POTENTIAL: Good   CLINICAL DECISION MAKING: Evolving/moderate complexity   EVALUATION COMPLEXITY: Moderate     GOALS: Goals reviewed with patient? Yes   SHORT TERM GOALS: Target date: 05/13/2021   Pt will have either dilator or internal sensor to use for treatment Baseline: has both Goal status: MET     LONG TERM GOALS: Target date: 10/02/21   All goals updated 07/10/21    Pt will be able to have at least 3 days / week where she has a good bowel movement Baseline:   Goal status: ongoing   2.  Pt will be independent with advanced HEP to maintain improvements made throughout therapy  Baseline:   Goal status: Ongoing   3.  Pt will report 50% less  abdominal bloating and discomfort due to improved muscle tone throughout the core  Baseline:  Goal status: ongoing   4.  Pt will report 50% reduction of pain due to improvements in posture, strength, and muscle length  Baseline:  Goal status: Ongoing   5. Pt will be able to have a bowel movement in the morning with it only taking 2-3 hours in the morning and feeling relief for the rest of the day     PLAN: PT FREQUENCY: 1x/week   PT DURATION: other: 16 (reducing frequency as able)   PLANNED INTERVENTIONS: Therapeutic exercises, Therapeutic activity, Neuromuscular re-education, Balance training, Gait training, Patient/Family education, Joint mobilization, Dry Needling, Electrical stimulation, Cryotherapy, Moist heat, Taping, Biofeedback, and Manual therapy   PLAN FOR NEXT SESSION: work on jaw relaxation using biofeedback while toileting, craniosacral ; down training internal sensor biofeedback  [employer], PT 07/23/21 5:27 PM

## 2021-07-29 ENCOUNTER — Encounter: Payer: Self-pay | Admitting: Physical Therapy

## 2021-07-29 ENCOUNTER — Ambulatory Visit: Payer: 59 | Admitting: Physical Therapy

## 2021-07-29 DIAGNOSIS — R278 Other lack of coordination: Secondary | ICD-10-CM | POA: Diagnosis not present

## 2021-07-29 DIAGNOSIS — R252 Cramp and spasm: Secondary | ICD-10-CM

## 2021-07-29 NOTE — Therapy (Signed)
OUTPATIENT PHYSICAL THERAPY TREATMENT NOTE   Patient Name: Brittany [name] MRN: [id number] DOB:03/14/1990, 31 y.o., female Today's Date: 07/29/2021  PCP: Inda Coke, PA REFERRING PROVIDER: Inda Coke, PA  END OF SESSION:   PT End of Session - 07/29/21 1203     Visit Number 21    Date for PT Re-Evaluation 10/02/21    Authorization Type [employer]    Authorization - Number of Visits [id number]    PT Start Time 1153   late   PT Stop Time 1232    PT Time Calculation (min) 39 min    Activity Tolerance Patient tolerated treatment well    Behavior During Therapy WFL for tasks assessed/performed                Past Medical History:  Diagnosis Date   Ankle injury    Anxiety    Eating disorder    Frequent headaches    Past Surgical History:  Procedure Laterality Date   ANKLE SURGERY  2018   HERNIA REPAIR     WISDOM TOOTH EXTRACTION     Patient Active Problem List   Diagnosis Date Noted   Positive ANA (antinuclear antibody) 10/19/2019   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   Anorexia nervosa, restricting type, in full remission, moderate 02/16/2018   History of depression 02/16/2018   GAD (generalized anxiety disorder) 12/16/2017   Pelvic floor dysfunction 12/16/2017   Right hip pain 11/02/2017   Grade 2 ankle sprain, right, initial encounter 05/16/2016   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 Yes NPRS scale: 6/10 Pain location: abdomen Pain orientation: Lower  PAIN TYPE: cramping,  encompassing Pain description: constant  Aggravating factors: haven't had a complete bowel movement  Relieving factors: having a full bowel movement      OBJECTIVE: (objective measures completed at initial evaluation unless otherwise dated)       GAIT: Comments: WFL   POSTURE:  Anterior pelvic tilt       PELVIC MMT:   MMT   04/14/2021 06/19/2021  Vaginal      Internal Anal Sphincter 3/5 3/5  External Anal Sphincter 2/5 2/5  Puborectalis 4/5 4/5  (Blank rows = not tested)         PALPATION:   General  tight lumbar paraspinals                 External Perineal Exam normal                             Internal Pelvic Floor TTP of sphincters and difficulty relaxing; can bulge partially; unable to palpate beyond puborectalis   TONE: high   PROLAPSE: None noted in supine   TODAY'S TREATMENT  07/29/2021  Manual Patient confirms identification and approves physical therapist to perform internal soft tissue work  Obdurator internus, coccygeus STM to lumbar and gluteals Trigger Point Dry-Needling  Treatment instructions: Expect mild to moderate muscle soreness. S/S of pneumothorax if dry  needled over a lung field, and to seek immediate medical attention should they occur. Patient verbalized understanding of these instructions and education.  Patient Consent Given: Yes Education handout provided: Yes Muscles treated: Rt piriformis, glute med, min, max, bil lumbar multifidi Electrical stimulation performed: No Parameters: N/A Treatment response/outcome: increased soft tissue length and twitch responses   07/23/2021 Neuro re-ed Biofeedback with internal sensor: deep squat, sitting simulating toileting, pushing out probe Manual Shoulder girdle, masseter, sublingual, suboccipitals, cranial fascia and hyoid fascia all released to level tissues will allow  07/17/2021 Neuro re-ed Biofeedback with internal sensor: deep squat, sitting simulating toileting Self care:  discussed ways to reduce time on the toilet - pt agrees to 2x after each meal for no more than 10 min       ASSESSMENT:   CLINICAL IMPRESSION: Pt was feeling more tension in pelvic floor and wrapping around back to gluteals.  Today's session focused on improved muscle length as the biofeedback was not available to use as planned.  Pt did have significant muscle spasms noted with internal soft tissue work.  Coccygeus release with trigger point release and obdurator internus continued to spasm and STM techniques did not alleviate today.  Pt is expected to see some results from manual over the next day or two.  Continue to address muscle spasms and coordination.   OBJECTIVE IMPAIRMENTS decreased coordination, decreased endurance, decreased strength, increased muscle spasms, impaired flexibility, impaired tone, and pain.    ACTIVITY LIMITATIONS  toileting and interpersonal relationships .    PERSONAL FACTORS Time since onset of injury/illness/exacerbation and connective tissue disorder  are also affecting patient's functional outcome.      REHAB POTENTIAL: Good   CLINICAL DECISION MAKING: Evolving/moderate complexity   EVALUATION COMPLEXITY: Moderate     GOALS: Goals reviewed with patient? Yes   SHORT TERM GOALS: Target date: 05/13/2021   Pt will have either dilator or internal sensor to use for treatment Baseline: has both Goal status: MET     LONG TERM GOALS: Target date: 10/02/21   All goals updated 07/10/21    Pt will be able to have at least 3 days / week where she has a good bowel movement Baseline:   Goal status: ongoing   2.  Pt will be independent with advanced HEP to maintain improvements made throughout therapy  Baseline:   Goal status: Ongoing   3.  Pt will report 50% less abdominal bloating and discomfort due to improved muscle tone throughout the core  Baseline:  Goal status: ongoing   4.  Pt will report 50% reduction of pain due to improvements in posture,  strength, and muscle length  Baseline:  Goal status: Ongoing   5. Pt will be able to have a bowel movement in the morning with it only taking 2-3 hours in the morning and feeling relief for the rest of the day     PLAN: PT FREQUENCY: 1x/week   PT DURATION: other: 16 (reducing frequency as able)   PLANNED INTERVENTIONS: Therapeutic exercises, Therapeutic activity, Neuromuscular re-education, Balance training, Gait training, Patient/Family education, Joint mobilization, Dry Needling, Electrical stimulation, Cryotherapy, Moist heat, Taping, Biofeedback, and Manual therapy   PLAN FOR NEXT SESSION: work on jaw relaxation using biofeedback while toileting, craniosacral ; down training internal sensor biofeedback  [employer], PT 07/29/21 12:45 PM

## 2021-08-05 ENCOUNTER — Ambulatory Visit: Payer: 59 | Attending: Physician Assistant | Admitting: Physical Therapy

## 2021-08-05 ENCOUNTER — Encounter: Payer: Self-pay | Admitting: Physical Therapy

## 2021-08-05 DIAGNOSIS — R278 Other lack of coordination: Secondary | ICD-10-CM | POA: Insufficient documentation

## 2021-08-05 DIAGNOSIS — R252 Cramp and spasm: Secondary | ICD-10-CM | POA: Diagnosis present

## 2021-08-05 NOTE — Therapy (Signed)
OUTPATIENT PHYSICAL THERAPY TREATMENT NOTE   Patient Name: Brittany [name] MRN: [id number] DOB:01-Dec-1990, 31 y.o., female Today's Date: 08/05/2021  PCP: Inda Coke, PA REFERRING PROVIDER: Inda Coke, PA  END OF SESSION:   PT End of Session - 08/05/21 1156     Visit Number 22    Date for PT Re-Evaluation 10/02/21    Authorization Type U[employer]    PT Start Time 1153    PT Stop Time 1230    PT Time Calculation (min) 37 min    Activity Tolerance Patient tolerated treatment well    Behavior During Therapy WFL for tasks assessed/performed                 Past Medical History:  Diagnosis Date   Ankle injury    Anxiety    Eating disorder    Frequent headaches    Past Surgical History:  Procedure Laterality Date   ANKLE SURGERY  2018   HERNIA REPAIR     WISDOM TOOTH EXTRACTION     Patient Active Problem List   Diagnosis Date Noted   Positive ANA (antinuclear antibody) 10/19/2019   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   Anorexia nervosa, restricting type, in full remission, moderate 02/16/2018   History of depression 02/16/2018   GAD (generalized anxiety disorder) 12/16/2017   Pelvic floor dysfunction 12/16/2017   Right hip pain 11/02/2017   Grade 2 ankle sprain, right, initial encounter 05/16/2016   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   PAIN:  Are you having pain? Yes NPRS scale: 6/10 Pain location: abdomen Pain orientation: Lower  PAIN TYPE: cramping,  encompassing Pain description: constant  Aggravating factors: haven't had a complete bowel movement  Relieving factors: having a full bowel movement      OBJECTIVE: (objective measures completed at initial evaluation unless otherwise dated)       GAIT: Comments: WFL   POSTURE:  Anterior pelvic tilt       PELVIC MMT:   MMT   04/14/2021 06/19/2021  Vaginal      Internal Anal Sphincter 3/5 3/5  External Anal Sphincter 2/5 2/5  Puborectalis 4/5 4/5  (Blank rows = not tested)         PALPATION:   General  tight lumbar paraspinals                 External Perineal Exam normal                             Internal Pelvic Floor TTP of sphincters and difficulty relaxing; can bulge partially; unable to palpate beyond puborectalis   TONE: high   PROLAPSE: None noted in supine   TODAY'S TREATMENT  08/05/2021  Manual Patient confirms identification and approves physical therapist to perform internal soft tissue work  Fascial triplaner release to perineal body externally and with fingers gripped one finger at [employer] Dry-Needling  Treatment instructions: Expect mild to moderate muscle soreness. S/S of  pneumothorax if dry needled over a lung field, and to seek immediate medical attention should they occur. Patient verbalized understanding of these instructions and education.  Patient Consent Given: Yes Education handout provided: Yes Muscles treated: Rt obturator and medial hamstring Electrical stimulation performed: No Parameters: N/A Treatment response/outcome: increased soft tissue length and twitch responses Self care:  Educated on doing just external release to leg muscles and nerves calm down due to very hightened irritabilty  07/29/2021  Manual Patient confirms identification and approves physical therapist to perform internal soft tissue work  Obdurator internus, coccygeus STM to lumbar and gluteals Trigger Point Dry-Needling  Treatment  instructions: Expect mild to moderate muscle soreness. S/S of pneumothorax if dry needled over a lung field, and to seek immediate medical attention should they occur. Patient verbalized understanding of these instructions and education.  Patient Consent Given: Yes Education handout provided: Yes Muscles treated: Rt piriformis, glute med, min, max, bil lumbar multifidi Electrical stimulation performed: No Parameters: N/A Treatment response/outcome: increased soft tissue length and twitch responses   07/23/2021 Neuro re-ed Biofeedback with internal sensor: deep squat, sitting simulating toileting, pushing out probe Manual Shoulder girdle, masseter, sublingual, suboccipitals, cranial fascia and hyoid fascia all released to level tissues will allow  07/17/2021 Neuro re-ed Biofeedback with internal sensor: deep squat, sitting simulating toileting Self care: discussed ways to reduce time on the toilet - pt agrees to 2x after each meal for no more than 10 min       ASSESSMENT:   CLINICAL IMPRESSION: Pt has been attempting to return to being sexually intimate with her husband and it has been difficult and causing emotional stress in her relationship.  Today's session focused on gentle fascial release.  Pt responded well but only very minimal release around the indroitus.  Pt was given education on taking a break from internal soft tissue work due to very high irritibility at this time.  Pt will benefit from skilled PT to continue to work on [place] and coordination.   OBJECTIVE IMPAIRMENTS decreased coordination, decreased endurance, decreased strength, increased muscle spasms, impaired flexibility, impaired tone, and pain.    ACTIVITY LIMITATIONS  toileting and interpersonal relationships .    PERSONAL FACTORS Time since onset of injury/illness/exacerbation and connective tissue disorder  are also affecting patient's functional outcome.      REHAB POTENTIAL: Good   CLINICAL DECISION  MAKING: Evolving/moderate complexity   EVALUATION COMPLEXITY: Moderate     GOALS: Goals reviewed with patient? Yes   SHORT TERM GOALS: Target date: 05/13/2021   Pt will have either dilator or internal sensor to use for treatment Baseline: has both Goal status: MET     LONG TERM GOALS: Target date: 10/02/21   All goals updated 07/10/21    Pt will be able to have at least 3 days / week where she has a good bowel movement Baseline:   Goal status: ongoing   2.  Pt will be independent with advanced HEP to maintain improvements made throughout therapy  Baseline:   Goal status: Ongoing   3.  Pt will report 50% less abdominal bloating and discomfort due to improved muscle tone throughout the core  Baseline:  Goal status: ongoing   4.  Pt will report 50% reduction of pain due to improvements in posture, strength, and muscle length  Baseline:  Goal status: Ongoing   5. Pt will be able to have a bowel movement in the morning with it only taking 2-3 hours in the morning and feeling  relief for the rest of the day     PLAN: PT FREQUENCY: 1x/week   PT DURATION: other: 16 (reducing frequency as able)   PLANNED INTERVENTIONS: Therapeutic exercises, Therapeutic activity, Neuromuscular re-education, Balance training, Gait training, Patient/Family education, Joint mobilization, Dry Needling, Electrical stimulation, Cryotherapy, Moist heat, Taping, Biofeedback, and Manual therapy   PLAN FOR NEXT SESSION: work on jaw relaxation using biofeedback while toileting, fascial release to pelvic floor as able and f/u on not using wand internally; educate on kehel for vabration massage  [employer], PT 08/05/21 12:00 PM

## 2021-08-10 NOTE — Therapy (Unsigned)
OUTPATIENT PHYSICAL THERAPY TREATMENT NOTE   Patient Name: Brittany [name] MRN: [id number] DOB:05-Dec-1990, 31 y.o., female Today's Date: 08/11/2021  PCP: Inda Coke, PA REFERRING PROVIDER: Inda Coke, PA  END OF SESSION:   PT End of Session - 08/11/21 1225     Visit Number 23    Date for PT Re-Evaluation 10/02/21    Authorization Type [employer]    PT Start Time 1153   late   PT Stop Time 1230    PT Time Calculation (min) 37 min    Activity Tolerance Patient tolerated treatment well    Behavior During Therapy WFL for tasks assessed/performed                  Past Medical History:  Diagnosis Date   Ankle injury    Anxiety    Eating disorder    Frequent headaches    Past Surgical History:  Procedure Laterality Date   ANKLE SURGERY  2018   HERNIA REPAIR     WISDOM TOOTH EXTRACTION     Patient Active Problem List   Diagnosis Date Noted   Positive ANA (antinuclear antibody) 10/19/2019   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   Anorexia nervosa, restricting type, in full remission, moderate 02/16/2018   History of depression 02/16/2018   GAD (generalized anxiety disorder) 12/16/2017   Pelvic floor dysfunction 12/16/2017   Right hip pain 11/02/2017   Grade 2 ankle sprain, right, initial encounter 05/16/2016   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  For example I cannot relax my jaw when I breath through my nose.  I still have bowel movements that are just a little and then I tighten back up   PAIN:  Are you having pain? Yes NPRS scale:  6/10 Pain location: abdomen Pain orientation: Lower  PAIN TYPE: cramping, encompassing Pain description: constant  Aggravating factors: haven't had a complete bowel movement  Relieving factors: having a full bowel movement      OBJECTIVE: (objective measures completed at initial evaluation unless otherwise dated)       GAIT: Comments: WFL   POSTURE:  Anterior pelvic tilt       PELVIC MMT:   MMT   04/14/2021 06/19/2021  Vaginal      Internal Anal Sphincter 3/5 3/5  External Anal Sphincter 2/5 2/5  Puborectalis 4/5 4/5  (Blank rows = not tested)         PALPATION:   General  tight lumbar paraspinals                 External Perineal Exam normal                             Internal Pelvic Floor TTP of sphincters and difficulty relaxing; can bulge partially; unable to palpate beyond puborectalis   TONE: high   PROLAPSE: None noted in supine   TODAY'S TREATMENT  08/11/21 Biofeedback: masseters and rectal sensor done simultaneously to see if there is a connection with jaw and pelvic floor tension; sitting with squatty potty  and deep squat stretch  08/05/2021  Manual Patient confirms identification and approves physical therapist to perform internal soft tissue work  Fascial triplaner release to perineal body externally and with fingers gripped one finger at [employer] Dry-Needling  Treatment instructions: Expect mild to moderate muscle soreness. S/S of pneumothorax if dry needled over a lung field, and to seek immediate medical attention should they occur. Patient verbalized understanding of these instructions and education.  Patient Consent Given: Yes Education handout provided: Yes Muscles treated: Rt obturator and medial hamstring Electrical stimulation performed: No Parameters: N/A Treatment response/outcome: increased soft tissue length and twitch responses Self care:  Educated on doing just external release to leg muscles and nerves calm down due  to very hightened irritabilty  07/29/2021  Manual Patient confirms identification and approves physical therapist to perform internal soft tissue work  Obdurator internus, coccygeus STM to lumbar and gluteals Trigger Point Dry-Needling  Treatment instructions: Expect mild to moderate muscle soreness. S/S of pneumothorax if dry needled over a lung field, and to seek immediate medical attention should they occur. Patient verbalized understanding of these instructions and education.  Patient Consent Given: Yes Education handout provided: Yes Muscles treated: Rt piriformis, glute med, min, max, bil lumbar multifidi Electrical stimulation performed: No Parameters: N/A Treatment response/outcome: increased soft tissue length and twitch responses   07/23/2021 Neuro re-ed Biofeedback with internal sensor: deep squat, sitting simulating toileting, pushing out probe Manual Shoulder girdle, masseter, sublingual, suboccipitals, cranial fascia and hyoid fascia all released to level tissues will allow  07/17/2021 Neuro re-ed Biofeedback with internal sensor: deep squat, sitting simulating toileting Self care: discussed ways to reduce time on the toilet - pt agrees to 2x after each meal for no more than 10 min       ASSESSMENT:   CLINICAL IMPRESSION: Pt felt less pain after taking a break from using the wand so much.  Pt did well with biofeedback to see if jaw tension was contributing to any difficulty having a bowel movement.  She was able to relax her jaw but noticed it tightens when she pushes to have a bowel movement.  She was still able to get the internal sensor out and is now better and relaxing the jaw after it tightens. Pt will benefit from skilled PT to continue to work on [place] and coordination.   OBJECTIVE IMPAIRMENTS decreased coordination, decreased endurance, decreased strength, increased muscle spasms, impaired flexibility, impaired tone, and pain.    ACTIVITY LIMITATIONS   toileting and interpersonal relationships .    PERSONAL FACTORS Time since onset of injury/illness/exacerbation and connective tissue disorder  are also affecting patient's functional outcome.      REHAB POTENTIAL: Good   CLINICAL DECISION MAKING: Evolving/moderate complexity   EVALUATION COMPLEXITY: Moderate     GOALS: Goals reviewed with patient? Yes   SHORT TERM GOALS: Target date: 05/13/2021   Pt will have either dilator or internal sensor to use for treatment Baseline: has both Goal status: MET     LONG TERM GOALS: Target date: 10/02/21   All goals updated 07/10/21    Pt will be able to have at least 3 days / week where she has a good bowel movement Baseline:   Goal status: ongoing   2.  Pt will be independent with advanced HEP to maintain improvements made throughout therapy  Baseline:   Goal status: Ongoing   3.  Pt will report 50% less abdominal bloating and discomfort due to improved muscle tone throughout the  core  Baseline:  Goal status: ongoing   4.  Pt will report 50% reduction of pain due to improvements in posture, strength, and muscle length  Baseline:  Goal status: Ongoing   5. Pt will be able to have a bowel movement in the morning with it only taking 2-3 hours in the morning and feeling relief for the rest of the day     PLAN: PT FREQUENCY: 1x/week   PT DURATION: other: 16 (reducing frequency as able)   PLANNED INTERVENTIONS: Therapeutic exercises, Therapeutic activity, Neuromuscular re-education, Balance training, Gait training, Patient/Family education, Joint mobilization, Dry Needling, Electrical stimulation, Cryotherapy, Moist heat, Taping, Biofeedback, and Manual therapy   PLAN FOR NEXT SESSION:  fascial release to pelvic floor as able and f/u on not using wand internally; educate on kehel for vabration massage  [employer], PT 08/11/21 12:26 PM

## 2021-08-11 ENCOUNTER — Ambulatory Visit: Payer: 59 | Admitting: Physical Therapy

## 2021-08-11 ENCOUNTER — Encounter: Payer: Self-pay | Admitting: Physical Therapy

## 2021-08-11 DIAGNOSIS — R252 Cramp and spasm: Secondary | ICD-10-CM | POA: Diagnosis not present

## 2021-08-11 DIAGNOSIS — R278 Other lack of coordination: Secondary | ICD-10-CM

## 2021-08-12 ENCOUNTER — Encounter (INDEPENDENT_AMBULATORY_CARE_PROVIDER_SITE_OTHER): Payer: Self-pay

## 2021-08-18 ENCOUNTER — Encounter: Payer: Self-pay | Admitting: Physical Therapy

## 2021-08-18 ENCOUNTER — Ambulatory Visit: Payer: 59 | Admitting: Physical Therapy

## 2021-08-18 DIAGNOSIS — R252 Cramp and spasm: Secondary | ICD-10-CM | POA: Diagnosis not present

## 2021-08-18 DIAGNOSIS — R278 Other lack of coordination: Secondary | ICD-10-CM

## 2021-08-18 NOTE — Therapy (Signed)
OUTPATIENT PHYSICAL THERAPY TREATMENT NOTE   Patient Name: Cortne Amara MRN: [id number] DOB:07-May-1990, 31 y.o., female Today's Date: 08/18/2021  PCP: Inda Coke, PA REFERRING PROVIDER: Inda Coke, PA  END OF SESSION:   PT End of Session - 08/18/21 1718     Visit Number 24    Date for PT Re-Evaluation 10/02/21    Authorization Type [employer]    PT Start Time 2919    PT Stop Time 1552    PT Time Calculation (min) 61 min    Activity Tolerance Patient tolerated treatment well    Behavior During Therapy WFL for tasks assessed/performed                   Past Medical History:  Diagnosis Date   Ankle injury    Anxiety    Eating disorder    Frequent headaches    Past Surgical History:  Procedure Laterality Date   ANKLE SURGERY  2018   HERNIA REPAIR     WISDOM TOOTH EXTRACTION     Patient Active Problem List   Diagnosis Date Noted   Positive ANA (antinuclear antibody) 10/19/2019   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   Anorexia nervosa, restricting type, in full remission, moderate 02/16/2018   History of depression 02/16/2018   GAD (generalized anxiety disorder) 12/16/2017   Pelvic floor dysfunction 12/16/2017   Right hip pain 11/02/2017   Grade 2 ankle sprain, right, initial encounter 05/16/2016   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 Yes NPRS scale: (did not give a number) Pain location: abdomen Pain orientation: Lower rectum  PAIN TYPE: pressure Pain  description: constant  Aggravating factors: haven't had a complete bowel movement  Relieving factors: having a full bowel movement      OBJECTIVE: (objective measures completed at initial evaluation unless otherwise dated)       GAIT: Comments: WFL   POSTURE:  Anterior pelvic tilt       PELVIC MMT:   MMT   04/14/2021 06/19/2021  Vaginal      Internal Anal Sphincter 3/5 3/5  External Anal Sphincter 2/5 2/5  Puborectalis 4/5 4/5  (Blank rows = not tested)         PALPATION:   General  tight lumbar paraspinals                 External Perineal Exam normal                             Internal Pelvic Floor TTP of sphincters and difficulty relaxing; can bulge partially; unable to palpate beyond puborectalis   TONE: high   PROLAPSE: None noted in supine   TODAY'S TREATMENT  08/18/21 Biofeedback: rectal sensor deep squat stretch; sitting position, supine with  cervical movements Manual - hyoid, shoulder girdle release; suboccipital release with moving into ease fascial releases  08/11/21 Biofeedback: masseters and rectal sensor done simultaneously to see if there is a connection with  jaw and pelvic floor tension; sitting with squatty potty and deep squat stretch  08/05/2021  Manual Patient confirms identification and approves physical therapist to perform internal soft tissue work  Fascial triplaner release to perineal body externally and with fingers gripped one finger at [employer] Dry-Needling  Treatment instructions: Expect mild to moderate muscle soreness. S/S of pneumothorax if dry needled over a lung field, and to seek immediate medical attention should they occur. Patient verbalized understanding of these instructions and education.  Patient Consent Given: Yes Education handout provided: Yes Muscles treated: Rt obturator and medial hamstring Electrical stimulation performed: No Parameters: N/A Treatment response/outcome: increased soft tissue length  and twitch responses Self care:  Educated on doing just external release to leg muscles and nerves calm down due to very hightened irritabilty          ASSESSMENT:   CLINICAL IMPRESSION: Pt had good response to biofeedback.  She was very consistently able to relax the pelvic floor.  Pt responded well to [facility] and cervical paraspinals and suboccipitals released significantly.  Initial treatment was used to review questions from previous session and discuss how self care and toileting techniques are going at home.  PT and pt agree that she can try using music when having a bowel movement to work on bowel retraining.  Focusing on the act of having a bowel movement seems to be inhibiting her physiology from allowing the bowel movement to be completed. Pt will benefit from skilled PT to continue to work on downtraining and coordination as well as body awareness.  Pt is has complexities which attribute to slower progress but she is expected to continue to make progress with skilled PT.   OBJECTIVE IMPAIRMENTS decreased coordination, decreased endurance, decreased strength, increased muscle spasms, impaired flexibility, impaired tone, and pain.    ACTIVITY LIMITATIONS  toileting and interpersonal relationships .    PERSONAL FACTORS Time since onset of injury/illness/exacerbation and connective tissue disorder  are also affecting patient's functional outcome.      REHAB POTENTIAL: Good   CLINICAL DECISION MAKING: Evolving/moderate complexity   EVALUATION COMPLEXITY: Moderate     GOALS: Goals reviewed with patient? Yes   SHORT TERM GOALS: Target date: 05/13/2021   Pt will have either dilator or internal sensor to use for treatment Baseline: has both Goal status: MET     LONG TERM GOALS: Target date: 10/02/21   All goals updated 08/18/21     Pt will be able to have at least 3 days / week where she has a good bowel movement Baseline:   Goal status: ongoing   2.  Pt will be independent  with advanced HEP to maintain improvements made throughout therapy  Baseline:   Goal status: Ongoing   3.  Pt will report 50% less abdominal bloating and discomfort due to improved muscle tone throughout the core  Baseline:  Goal status: ongoing   4.  Pt will report 50% reduction of pain due to improvements in posture, strength, and muscle length  Baseline:  Goal status: Ongoing   5. Pt will be able to have a bowel movement in the morning with it only taking 2-3 hours in the morning and feeling relief for the rest of the day Baseline: 4-5 hours Goal status: ongoing      PLAN: PT FREQUENCY: 1x/week   PT DURATION: other: 16 (reducing frequency as able)   PLANNED INTERVENTIONS: Therapeutic exercises, Therapeutic activity, Neuromuscular re-education, Balance training, Gait training, Patient/Family education, Joint mobilization, Dry  Needling, Electrical stimulation, Cryotherapy, Moist heat, Taping, Biofeedback, and Manual therapy   PLAN FOR NEXT SESSION:  fascial release to pelvic floor as able and discuss kehel for vabration massage; hyoid and cervical release; f/u on music; colon mobility fascial release  Gustavus Bryant, PT 08/18/21 5:20 PM

## 2021-08-25 ENCOUNTER — Ambulatory Visit: Payer: 59 | Admitting: Physical Therapy

## 2021-08-25 NOTE — Therapy (Deleted)
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   Anorexia nervosa, restricting type, in full remission, moderate 02/16/2018   History of depression 02/16/2018   GAD (generalized anxiety disorder) 12/16/2017   Pelvic floor dysfunction 12/16/2017   Right hip pain 11/02/2017   Grade 2 ankle sprain, right, initial encounter 05/16/2016   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 Yes NPRS scale: (did not give a number) Pain location: abdomen Pain orientation: Lower rectum  PAIN TYPE: pressure Pain description: constant  Aggravating factors: haven't had a complete bowel movement  Relieving factors: having a full bowel movement      OBJECTIVE: (objective measures completed at initial evaluation unless otherwise dated)       GAIT: Comments: WFL   POSTURE:  Anterior pelvic tilt       PELVIC MMT:   MMT   04/14/2021  06/19/2021  Vaginal      Internal Anal Sphincter 3/5 3/5  External Anal Sphincter 2/5 2/5  Puborectalis 4/5 4/5  (Blank rows = not tested)         PALPATION:   General  tight lumbar paraspinals                 External Perineal Exam normal                             Internal Pelvic Floor TTP of sphincters and difficulty relaxing; can bulge partially; unable to palpate beyond puborectalis   TONE: high   PROLAPSE: None noted in supine   TODAY'S TREATMENT  08/18/21 Biofeedback: rectal sensor deep squat stretch; sitting position, supine with  cervical movements Manual - hyoid, shoulder girdle release; suboccipital release with moving into ease fascial releases  08/11/21 Biofeedback: masseters and rectal sensor done simultaneously to see if there is a connection with jaw and pelvic floor tension; sitting with squatty potty and deep squat stretch  08/05/2021  Manual Patient confirms identification and approves physical therapist to perform internal soft tissue work  Fascial triplaner release to perineal body externally and with fingers gripped one finger at [employer] Dry-Needling  Treatment instructions: Expect mild to moderate muscle soreness. S/S of pneumothorax if dry needled over a lung field, and to seek immediate medical attention should they occur. Patient verbalized understanding  of these instructions and education.  Patient Consent Given: Yes Education handout provided: Yes Muscles treated: Rt obturator and medial hamstring Electrical stimulation performed: No Parameters: N/A Treatment response/outcome: increased soft tissue length and twitch responses Self care:  Educated on doing just external release to leg muscles and nerves calm down due to very hightened irritabilty          ASSESSMENT:   CLINICAL IMPRESSION: Pt had good response to biofeedback.  She was very consistently able to relax the pelvic floor.  Pt responded well to [facility] and cervical  paraspinals and suboccipitals released significantly.  Initial treatment was used to review questions from previous session and discuss how self care and toileting techniques are going at home.  PT and pt agree that she can try using music when having a bowel movement to work on bowel retraining.  Focusing on the act of having a bowel movement seems to be inhibiting her physiology from allowing the bowel movement to be completed. Pt will benefit from skilled PT to continue to work on downtraining and coordination as well as body awareness.  Pt is has complexities which attribute to slower progress but she is expected to continue to make progress with skilled PT.   OBJECTIVE IMPAIRMENTS decreased coordination, decreased endurance, decreased strength, increased muscle spasms, impaired flexibility, impaired tone, and pain.    ACTIVITY LIMITATIONS  toileting and interpersonal relationships .    PERSONAL FACTORS Time since onset of injury/illness/exacerbation and connective tissue disorder  are also affecting patient's functional outcome.      REHAB POTENTIAL: Good   CLINICAL DECISION MAKING: Evolving/moderate complexity   EVALUATION COMPLEXITY: Moderate     GOALS: Goals reviewed with patient? Yes   SHORT TERM GOALS: Target date: 05/13/2021   Pt will have either dilator or internal sensor to use for treatment Baseline: has both Goal status: MET     LONG TERM GOALS: Target date: 10/02/21   All goals updated 08/18/21     Pt will be able to have at least 3 days / week where she has a good bowel movement Baseline:   Goal status: ongoing   2.  Pt will be independent with advanced HEP to maintain improvements made throughout therapy  Baseline:   Goal status: Ongoing   3.  Pt will report 50% less abdominal bloating and discomfort due to improved muscle tone throughout the core  Baseline:  Goal status: ongoing   4.  Pt will report 50% reduction of pain due to improvements in posture,  strength, and muscle length  Baseline:  Goal status: Ongoing   5. Pt will be able to have a bowel movement in the morning with it only taking 2-3 hours in the morning and feeling relief for the rest of the day Baseline: 4-5 hours Goal status: ongoing      PLAN: PT FREQUENCY: 1x/week   PT DURATION: other: 16 (reducing frequency as able)   PLANNED INTERVENTIONS: Therapeutic exercises, Therapeutic activity, Neuromuscular re-education, Balance training, Gait training, Patient/Family education, Joint mobilization, Dry Needling, Electrical stimulation, Cryotherapy, Moist heat, Taping, Biofeedback, and Manual therapy   PLAN FOR NEXT SESSION:  fascial release to pelvic floor as able and discuss kehel for vabration massage; hyoid and cervical release; f/u on music; colon mobility fascial release  Gustavus Bryant, PT 08/25/21 8:05 AM

## 2021-09-01 ENCOUNTER — Encounter: Payer: Self-pay | Admitting: Physical Therapy

## 2021-09-01 ENCOUNTER — Ambulatory Visit: Payer: 59 | Admitting: Physical Therapy

## 2021-09-01 DIAGNOSIS — R252 Cramp and spasm: Secondary | ICD-10-CM | POA: Diagnosis not present

## 2021-09-01 DIAGNOSIS — R278 Other lack of coordination: Secondary | ICD-10-CM

## 2021-09-01 NOTE — Therapy (Signed)
OUTPATIENT PHYSICAL THERAPY TREATMENT NOTE   Patient Name: Brittany [name] MRN: [id number] DOB:20-Aug-1990, 31 y.o., female Today's Date: 09/01/2021  PCP: Inda Coke, PA REFERRING PROVIDER: Inda Coke, PA  END OF SESSION:   PT End of Session - 09/01/21 1256     Visit Number 25    Date for PT Re-Evaluation 10/02/21    Authorization Type [employer]    PT Start Time 1240   late   PT Stop Time 1313    PT Time Calculation (min) 33 min    Activity Tolerance Patient tolerated treatment well    Behavior During Therapy WFL for tasks assessed/performed                    Past Medical History:  Diagnosis Date   Ankle injury    Anxiety    Eating disorder    Frequent headaches    Past Surgical History:  Procedure Laterality Date   ANKLE SURGERY  2018   HERNIA REPAIR     WISDOM TOOTH EXTRACTION     Patient Active Problem List   Diagnosis Date Noted   Positive ANA (antinuclear antibody) 10/19/2019   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   Anorexia nervosa, restricting type, in full remission, moderate 02/16/2018   History of depression 02/16/2018   GAD (generalized anxiety disorder) 12/16/2017   Pelvic floor dysfunction 12/16/2017   Right hip pain 11/02/2017   Grade 2 ankle sprain, right, initial encounter 05/16/2016   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  I am on the second to largest rectal dilator.  I think the practice to relax immediately with the biofeedback is the most helpful thing but I cannot have anything internal now because I am so bloated    PAIN:  Are you having pain? Yes NPRS scale: (did not give a number) Pain location: abdomen Pain orientation: Lower rectum  PAIN TYPE: pressure Pain description: constant  Aggravating factors: haven't had a complete bowel movement  Relieving factors: having a full bowel movement      OBJECTIVE: (objective measures completed at initial evaluation unless otherwise dated)       GAIT: Comments: WFL   POSTURE:  Anterior pelvic tilt       PELVIC MMT:   MMT   04/14/2021 06/19/2021  Vaginal      Internal Anal Sphincter 3/5 3/5  External Anal Sphincter 2/5 2/5  Puborectalis 4/5 4/5  (Blank rows = not tested)         PALPATION:   General  tight lumbar paraspinals                 External Perineal Exam normal                             Internal Pelvic Floor TTP of sphincters and difficulty relaxing; can bulge partially; unable to palpate beyond puborectalis   TONE: high   PROLAPSE: None noted in supine   TODAY'S TREATMENT  09/01/21 Self care: discussed the things that are working the best and working on  progress Working on pushing and bulging pelvic floor while exhaling through your mouth Exercise: Vibration plate hamstring and lumbar stretch - lumbar stretch sitting on vib plate stopped due to feeling uncomfortable  08/18/21 Biofeedback: rectal sensor deep squat stretch; sitting position, supine with  cervical movements Manual - hyoid, shoulder girdle release; suboccipital release with moving into ease fascial releases  08/11/21 Biofeedback: masseters and rectal sensor done simultaneously to see if there is a connection with jaw and pelvic floor tension; sitting with squatty potty and deep squat stretch  08/05/2021  Manual Patient confirms identification and approves physical therapist to perform internal soft tissue work  Fascial triplaner release to perineal body externally and with fingers gripped one finger at [employer] Dry-Needling  Treatment  instructions: Expect mild to moderate muscle soreness. S/S of pneumothorax if dry needled over a lung field, and to seek immediate medical attention should they occur. Patient verbalized understanding of these instructions and education.  Patient Consent Given: Yes Education handout provided: Yes Muscles treated: Rt obturator and medial hamstring Electrical stimulation performed: No Parameters: N/A Treatment response/outcome: increased soft tissue length and twitch responses Self care:  Educated on doing just external release to leg muscles and nerves calm down due to very hightened irritabilty          ASSESSMENT:   CLINICAL IMPRESSION: Today's session focused on trying vibration for muscle relaxation and discussing where to find tool to use at home for vibration massage to pelvic floor.  Pt also worked on feeling with tactile cue how to contract, relax, bulge to work on doing this several times in a row at home.  Pt doing well but has not been able to be consistent the past two weeks due to more pain and bloating.  Continue per plan of care recommended.   OBJECTIVE IMPAIRMENTS decreased coordination, decreased endurance, decreased strength, increased muscle spasms, impaired flexibility, impaired tone, and pain.    ACTIVITY LIMITATIONS  toileting and interpersonal relationships .    PERSONAL FACTORS Time since onset of injury/illness/exacerbation and connective tissue disorder  are also affecting patient's functional outcome.      REHAB POTENTIAL: Good   CLINICAL DECISION MAKING: Evolving/moderate complexity   EVALUATION COMPLEXITY: Moderate     GOALS: Goals reviewed with patient? Yes   SHORT TERM GOALS: Target date: 05/13/2021   Pt will have either dilator or internal sensor to use for treatment Baseline: has both Goal status: MET     LONG TERM GOALS: Target date: 10/02/21   All goals updated 08/18/21     Pt will be able to have at least 3 days / week where she has a  good bowel movement Baseline:   Goal status: ongoing   2.  Pt will be independent with advanced HEP to maintain improvements made throughout therapy  Baseline:   Goal status: Ongoing   3.  Pt will report 50% less abdominal bloating and discomfort due to improved muscle tone throughout the core  Baseline:  Goal status: ongoing   4.  Pt will report 50% reduction of pain due to improvements in posture, strength, and muscle length  Baseline:  Goal status: Ongoing   5. Pt will be able to have a bowel movement in the morning with it only taking 2-3 hours in the morning and feeling relief for the rest of the day Baseline: 4-5 hours Goal status: ongoing      PLAN: PT FREQUENCY: 1x/week   PT DURATION: other: 16 (reducing frequency as able)  PLANNED INTERVENTIONS: Therapeutic exercises, Therapeutic activity, Neuromuscular re-education, Balance training, Gait training, Patient/Family education, Joint mobilization, Dry Needling, Electrical stimulation, Cryotherapy, Moist heat, Taping, Biofeedback, and Manual therapy   PLAN FOR NEXT SESSION:  fascial release to pelvic floor as able and f/u kehel for vabration massage; hyoid and cervical release; f/u contract, relax, bulge, re-measure abdomen around umbilicus (53YY this session)  Gustavus Bryant, PT 09/01/21 1:59 PM

## 2021-09-08 ENCOUNTER — Encounter: Payer: Self-pay | Admitting: Physical Therapy

## 2021-09-08 ENCOUNTER — Ambulatory Visit: Payer: 59 | Attending: Physician Assistant | Admitting: Physical Therapy

## 2021-09-08 DIAGNOSIS — R252 Cramp and spasm: Secondary | ICD-10-CM | POA: Diagnosis present

## 2021-09-08 DIAGNOSIS — R278 Other lack of coordination: Secondary | ICD-10-CM | POA: Insufficient documentation

## 2021-09-08 DIAGNOSIS — M545 Low back pain, unspecified: Secondary | ICD-10-CM | POA: Insufficient documentation

## 2021-09-08 DIAGNOSIS — G8929 Other chronic pain: Secondary | ICD-10-CM | POA: Diagnosis present

## 2021-09-08 NOTE — Therapy (Signed)
OUTPATIENT PHYSICAL THERAPY TREATMENT NOTE   Patient Name: Brittany [name] MRN: [id number] DOB:December 14, 1990, 31 y.o., female Today's Date: 09/08/2021  PCP: Inda Coke, PA REFERRING PROVIDER: Inda Coke, PA  END OF SESSION:   PT End of Session - 09/08/21 1622     Visit Number 26    Date for PT Re-Evaluation 10/02/21    Authorization Type [employer]    Authorization - Number of Visits 60    PT Start Time 3300    PT Stop Time 1700    PT Time Calculation (min) 39 min    Activity Tolerance Patient tolerated treatment well    Behavior During Therapy WFL for tasks assessed/performed                     Past Medical History:  Diagnosis Date   Ankle injury    Anxiety    Eating disorder    Frequent headaches    Past Surgical History:  Procedure Laterality Date   ANKLE SURGERY  2018   HERNIA REPAIR     WISDOM TOOTH EXTRACTION     Patient Active Problem List   Diagnosis Date Noted   Positive ANA (antinuclear antibody) 10/19/2019   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   Anorexia nervosa, restricting type, in full remission, moderate 02/16/2018   History of depression 02/16/2018   GAD (generalized anxiety disorder) 12/16/2017   Pelvic floor dysfunction 12/16/2017   Right hip pain 11/02/2017   Grade 2 ankle sprain, right, initial encounter 05/16/2016   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 I am uncomfortable and heavy in the pelvic floor.  Pressure in my gut is present.  No pain.   PAIN:  Are you having pain? No NPRS scale:  (did not give a number) Pain location: abdomen Pain orientation: Lower rectum  PAIN TYPE: pressure Pain description: constant  Aggravating factors: haven't had a complete bowel movement  Relieving factors: having a full bowel movement      OBJECTIVE: (objective measures completed at initial evaluation unless otherwise dated)       GAIT: Comments: WFL   POSTURE:  Anterior pelvic tilt       PELVIC MMT:   MMT   04/14/2021 06/19/2021  Vaginal      Internal Anal Sphincter 3/5 3/5  External Anal Sphincter 2/5 2/5  Puborectalis 4/5 4/5  (Blank rows = not tested)         PALPATION:   General  tight lumbar paraspinals                 External Perineal Exam normal                             Internal Pelvic Floor TTP of sphincters and difficulty relaxing; can bulge partially; unable to palpate beyond puborectalis   TONE: high   PROLAPSE: None noted in supine   TODAY'S TREATMENT  09/08/21 Biofeedback: rectal sensor deep squat stretch; sitting position, side lying Self care: intercourse or outercourse position handout; timing the  time she stays in bed  09/01/21 Self care: discussed the things that are working the best and working on progress Working on pushing and bulging pelvic floor while exhaling through your mouth Exercise: Vibration plate hamstring and lumbar stretch - lumbar stretch sitting on vib plate stopped due to feeling uncomfortable  08/18/21 Biofeedback: rectal sensor deep squat stretch; sitting position, supine with  cervical movements Manual - hyoid, shoulder girdle release; suboccipital release with moving into ease fascial releases  08/11/21 Biofeedback: masseters and rectal sensor done simultaneously to see if there is a connection with jaw and pelvic floor tension; sitting with squatty potty and deep squat stretch       ASSESSMENT:   CLINICAL IMPRESSION: Today's session focused on biofeedback.  Pt able to push and stay down to 5-37m and push sensor  out. Self care focused on reducing pain and different positions.  Handout provided for being intimate with partner and working on returning to activities with her partner. Also discussed focusing on time spent in bed verses time on toilet for  getting better sleep.   OBJECTIVE IMPAIRMENTS decreased coordination, decreased endurance, decreased strength, increased muscle spasms, impaired flexibility, impaired tone, and pain.    ACTIVITY LIMITATIONS  toileting and interpersonal relationships .    PERSONAL FACTORS Time since onset of injury/illness/exacerbation and connective tissue disorder  are also affecting patient's functional outcome.      REHAB POTENTIAL: Good   CLINICAL DECISION MAKING: Evolving/moderate complexity   EVALUATION COMPLEXITY: Moderate     GOALS: Goals reviewed with patient? Yes   SHORT TERM GOALS: Target date: 05/13/2021   Pt will have either dilator or internal sensor to use for treatment Baseline: has both Goal status: MET     LONG TERM GOALS: Target date: 10/02/21   All goals updated 08/18/21     Pt will be able to have at least 3 days / week where she has a good bowel movement Baseline:   Goal status: ongoing   2.  Pt will be independent with advanced HEP to maintain improvements made throughout therapy  Baseline:   Goal status: Ongoing   3.  Pt will report 50% less abdominal bloating and discomfort due to improved muscle tone throughout the core  Baseline:  Goal status: ongoing   4.  Pt will report 50% reduction of pain due to improvements in posture, strength, and muscle length  Baseline:  Goal status: Ongoing   5. Pt will be able to have a bowel movement in the morning with it only taking 2-3 hours in the morning and feeling relief for the rest of the day Baseline: 4-5 hours Goal status: ongoing      PLAN: PT FREQUENCY: 1x/week   PT DURATION: other: 16 (reducing frequency as able)   PLANNED INTERVENTIONS: Therapeutic exercises,  Therapeutic activity, Neuromuscular re-education, Balance training, Gait training, Patient/Family education, Joint mobilization, Dry Needling, Electrical stimulation, Cryotherapy, Moist heat, Taping, Biofeedback, and Manual therapy   PLAN FOR NEXT SESSION:  how was time in bed? re-measure abdomen around umbilicus ([id number] session); biofeedback and positions that provide more relaxation  [employer] PT 09/08/21 4:35 PM

## 2021-09-15 ENCOUNTER — Encounter: Payer: 59 | Admitting: Physical Therapy

## 2021-09-22 ENCOUNTER — Encounter: Payer: 59 | Admitting: Physical Therapy

## 2021-09-28 NOTE — Therapy (Deleted)
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   Anorexia nervosa, restricting type, in full remission, moderate 02/16/2018   History of depression 02/16/2018   GAD (generalized anxiety disorder) 12/16/2017   Pelvic floor dysfunction 12/16/2017   Right hip pain 11/02/2017   Grade 2 ankle sprain, right, initial encounter 05/16/2016   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 I am uncomfortable and heavy in the pelvic floor.  Pressure in my gut is present.  No pain.   PAIN:  Are you having pain? No NPRS scale: (did not give a number) Pain location: abdomen Pain orientation: Lower rectum  PAIN TYPE: pressure Pain description: constant  Aggravating factors: haven't had a complete bowel movement  Relieving factors: having a full bowel movement      OBJECTIVE: (objective measures completed at initial evaluation unless otherwise dated)       GAIT: Comments: WFL    POSTURE:  Anterior pelvic tilt       PELVIC MMT:   MMT   04/14/2021 06/19/2021  Vaginal      Internal Anal Sphincter 3/5 3/5  External Anal Sphincter 2/5 2/5  Puborectalis 4/5 4/5  (Blank rows = not tested)         PALPATION:   General  tight lumbar paraspinals                 External Perineal Exam normal                             Internal Pelvic Floor TTP of sphincters and difficulty relaxing; can bulge partially; unable to palpate beyond puborectalis   TONE: high   PROLAPSE: None noted in supine   TODAY'S TREATMENT  09/08/21 Biofeedback: rectal sensor deep squat stretch; sitting position, side lying Self care: intercourse or outercourse position handout; timing the time she stays in bed  09/01/21 Self care: discussed the things that are working the best and working on progress Working on pushing and bulging pelvic floor while exhaling through your mouth Exercise: Vibration plate hamstring and lumbar stretch - lumbar stretch sitting on vib plate stopped due to feeling uncomfortable  08/18/21 Biofeedback: rectal sensor deep squat stretch; sitting position, supine with  cervical movements Manual - hyoid, shoulder girdle release; suboccipital release with moving into ease fascial releases  08/11/21 Biofeedback: masseters and rectal sensor done  simultaneously to see if there is a connection with jaw and pelvic floor tension; sitting with squatty potty and deep squat stretch       ASSESSMENT:   CLINICAL IMPRESSION: Today's session focused on biofeedback.  Pt able to push and stay down to 5-12m and push sensor out. Self care focused on reducing pain and different positions.  Handout provided for being intimate with partner and working on returning to activities with her partner. Also discussed focusing on time spent in bed verses time on toilet for  getting better sleep.   OBJECTIVE IMPAIRMENTS decreased coordination, decreased endurance, decreased strength, increased  muscle spasms, impaired flexibility, impaired tone, and pain.    ACTIVITY LIMITATIONS  toileting and interpersonal relationships .    PERSONAL FACTORS Time since onset of injury/illness/exacerbation and connective tissue disorder  are also affecting patient's functional outcome.      REHAB POTENTIAL: Good   CLINICAL DECISION MAKING: Evolving/moderate complexity   EVALUATION COMPLEXITY: Moderate     GOALS: Goals reviewed with patient? Yes   SHORT TERM GOALS: Target date: 05/13/2021   Pt will have either dilator or internal sensor to use for treatment Baseline: has both Goal status: MET     LONG TERM GOALS: Target date: 10/02/21   All goals updated 08/18/21     Pt will be able to have at least 3 days / week where she has a good bowel movement Baseline:   Goal status: ongoing   2.  Pt will be independent with advanced HEP to maintain improvements made throughout therapy  Baseline:   Goal status: Ongoing   3.  Pt will report 50% less abdominal bloating and discomfort due to improved muscle tone throughout the core  Baseline:  Goal status: ongoing   4.  Pt will report 50% reduction of pain due to improvements in posture, strength, and muscle length  Baseline:  Goal status: Ongoing   5. Pt will be able to have a bowel movement in the morning with it only taking 2-3 hours in the morning and feeling relief for the rest of the day Baseline: 4-5 hours Goal status: ongoing      PLAN: PT FREQUENCY: 1x/week   PT DURATION: other: 16 (reducing frequency as able)   PLANNED INTERVENTIONS: Therapeutic exercises, Therapeutic activity, Neuromuscular re-education, Balance training, Gait training, Patient/Family education, Joint mobilization, Dry Needling, Electrical stimulation, Cryotherapy, Moist heat, Taping, Biofeedback, and Manual therapy   PLAN FOR NEXT SESSION:  how was time in bed? re-measure abdomen around umbilicus ([id number] session); biofeedback and positions that provide  more relaxation  [employer] PT 09/28/21 4:38 PM

## 2021-09-29 ENCOUNTER — Ambulatory Visit: Payer: 59 | Admitting: Physical Therapy

## 2021-10-12 ENCOUNTER — Encounter: Payer: Self-pay | Admitting: Physical Medicine and Rehabilitation

## 2021-10-12 ENCOUNTER — Encounter: Payer: 59 | Attending: Physical Medicine and Rehabilitation | Admitting: Physical Medicine and Rehabilitation

## 2021-10-12 VITALS — BP 114/72 | HR 93 | Ht 64.0 in | Wt 118.0 lb

## 2021-10-12 DIAGNOSIS — R0989 Other specified symptoms and signs involving the circulatory and respiratory systems: Secondary | ICD-10-CM | POA: Insufficient documentation

## 2021-10-12 DIAGNOSIS — M6289 Other specified disorders of muscle: Secondary | ICD-10-CM | POA: Diagnosis present

## 2021-10-12 DIAGNOSIS — G8929 Other chronic pain: Secondary | ICD-10-CM | POA: Insufficient documentation

## 2021-10-12 DIAGNOSIS — M545 Low back pain, unspecified: Secondary | ICD-10-CM | POA: Insufficient documentation

## 2021-10-12 DIAGNOSIS — M357 Hypermobility syndrome: Secondary | ICD-10-CM | POA: Diagnosis present

## 2021-10-12 NOTE — Addendum Note (Signed)
Addended by: Izora Ribas on: 10/12/2021 12:07 PM   Modules accepted: Orders

## 2021-10-12 NOTE — Progress Notes (Addendum)
Subjective:    Patient ID: Brittany [name], female    DOB: 04/13/1990, 31 y.o.   MRN: [id number]  HPI  Brittany [name] is a 31 year old woman who presents for f/u of chronic pelvic pain.  1) Pelvic floor dysfunction: -they second neuromodulation attempt did not help and was very painful. She now has scars on her buttocks -pain has been stable since last visit -her mental health is currently helping her the most.  -she is concerned that bowel retraining will not work. This is supposed to be when she only allows herself to be in the bathroom for a certain amount of time and number of times during the day.  -when she can get both her smooth and skeletal muscles to relax for her to go, it only lasts so long and she still has incomplete evacuation.  -she has good days and [name] days.  -she is able to appreciate the good days -she asks how to get the medical records of her MRI to share with her therapist -she will be trying biofeedback today -she is trying dry needling in the pelvic flood and is seeing a pelvic floor therapist to do rectal biofeedback and trying rectal dilators. She has progressed in that she can use bigger ones.  -She has done PT which she feels is a temporary band aid.  -Average pain is 7/10 -She feels that some, but not all of the pain is from anxiety. 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  -pain has been more bearable -she is trying to find a lot of mental health help -she is continuing with therapy -pain is still very [name] often and extremely life-limiting.  -she has benefited from working with chiropractor -she is very limited in what she can physically do -Brittany [name] has tried valium suppositories -she uses a splint and release tool and it helps to keep the prolapse in place to that she can have a more normal BM.   2) Hypermobility syndrome: - she has chronic ankle injuries  as a result -she fees that a lot of her tightness is related to her OCD -she recently got EDS test -she will be following with endocrinology  3) Throat tightness: -she loves to sing and this has inhibited her ability to do so.   3) Lower back pain -improved with chiropractor  4) Anxiety -is trying valium suppositories  5) Visual disturbances: -worse with stress -discussed with her father who has floaters and she has a family history of migraines  6) Constipation -still severe without improvement  Pain Inventory Average Pain 6 Pain Right Now 4 My pain is intermittent, constant, sharp, dull, tingling, and aching  In the last 24 hours, has pain interfered with the following? General activity 10 Relation with others 8 Enjoyment of life 10 What TIME of day is your pain at its worst? daytime and evening Sleep (in general) Fair  Pain is worse with: bending, inactivity, unsure, and some activites Pain improves with: therapy/exercise Relief from Meds: 0  walk without assistance how many minutes can you walk? depends ability to climb steps?  yes do you drive?  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  bladder control problems bowel control problems weakness numbness spasms dizziness depression anxiety       Family History  Problem Relation Age of Onset  Depression Mother    Anxiety disorder Mother    Depression Brother    Anxiety disorder Brother    Diabetes Maternal Grandmother    Hearing loss Maternal Grandmother    Alcohol abuse Maternal Grandfather    Depression Maternal Grandfather    Diabetes Maternal Grandfather    Dementia Paternal Grandfather    Thyroid disease Brother    Social History   Socioeconomic History   Marital status: Married    Spouse name: Not on file   Number of children: Not on file   Years of education: Not on file   Highest education level: Not on file  Occupational History   Not on file   Tobacco Use   Smoking status: Never   Smokeless tobacco: Never  Vaping Use   Vaping Use: Never used  Substance and Sexual Activity   Alcohol use: No    Alcohol/week: 0.0 standard drinks of alcohol   Drug use: No   Sexual activity: Yes    Birth control/protection: None  Other Topics Concern   Not on file  Social History Narrative   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  Opioid Risk Score:   Fall Risk Score:  `1  Depression screen [facility] 2/9     06/09/2021   10:59 AM 10/24/2020    2:48 PM 08/26/2020    2:03 PM 07/22/2020    2:18 PM 04/07/2020    2:06 PM 04/02/2019    8:38 AM 02/09/2016    3:08 PM  Depression screen PHQ 2/9  Decreased Interest 0 1 1 1 1  0 0  Down, Depressed, Hopeless 0 2 1 1 1 1  0  PHQ - 2 Score 0 3 2 2 2 1  0  Altered sleeping  1   1 0   Tired, decreased energy  1   1 1    Change in appetite  0   0 0   Feeling [name] or failure about yourself   3   3 3    Trouble concentrating  3   3 2    Moving slowly or fidgety/restless  1   1 0   Suicidal thoughts  0   1 2   PHQ-9 Score  12   12 9    Difficult doing work/chores  Very difficult   Extremely dIfficult Somewhat difficult    Review of Systems  Constitutional: Negative.        Night sweats  HENT: Negative.    Eyes: Negative.   Respiratory: Negative.    Cardiovascular: Negative.   Gastrointestinal:  Positive for abdominal pain, constipation and diarrhea.  Endocrine: Negative.   Musculoskeletal:  Positive for  arthralgias, back pain and neck pain.       Stomach and pelvic area  Skin: Negative.   Allergic/Immunologic: Negative.   Psychiatric/Behavioral:  The patient is nervous/anxious.   All other systems reviewed and  are negative.      Objective:   Physical Exam Gen: no distress, normal appearing, BMI 20.25 HEENT: oral mucosa pink and moist, NCAT Cardio: Reg rate Chest: normal effort, normal rate of breathing Abd: soft, non-distended Ext: no edema Psych: pleasant, normal affect, discusses her anxieties regarding her pain and bowel regimen, OCD tendencies Skin: intact Neuro: Alert and oriented x3  Assessment & Plan:  Brittany [name] is a 31 year old woman who presents for follow-up of pelvic floor dysfunction.  1) Pelvic floor dysfunction: -she takes magnesium citrate and even then she cannot have complete evacuation. -encouraged rectal biofeedback today -discussed her pelvic floor therapy -discussed extracorporeal shockwave therapy -discussed that MRI results are normal, back pain is likely ligamentous/muscular -discussed that she can request MRI records by calling medical records -continue pelvic floor PT, rectal biofeedback, progressive rectal dilation.  -once in a while she has bowel incontinence -She has pelvic floor pain and has had great benefit from pelvic floor therapists.  -discussed pudendal nerve entrapment, prolotherapy. Provided link for further education.  -discussed TCA. -discussed Botox as an option -continue magnesium citrate.  -this has a huge impact on her quality of life.  -discussed anti-spasticity medications and Botox. Extensively discussed the benefits of Botox.  -Provided with a pain relief journal and discussed that it contains foods and lifestyle tips to naturally help to improve pain. Discussed that these lifestyle strategies are also very good for health unlike some medications which can have negative side effects. Discussed that the act of keeping a journal  can be therapeutic and helpful to realize patterns what helps to trigger and alleviate pain.   -continue internal massaging -Discussed current symptoms of pain and history of pain.  -Discussed benefits of exercise in reducing pain. -Discussed following foods that may reduce pain: 1) Ginger (especially studied for arthritis)- reduce leukotriene production to decrease inflammation 2) Blueberries- high in phytonutrients that decrease inflammation 3) Salmon- marine omega-3s reduce joint swelling and pain 4) Pumpkin seeds- reduce inflammation 5) dark chocolate- reduces inflammation 6) turmeric- reduces inflammation 7) tart cherries - reduce pain and stiffness 8) extra virgin olive oil - its compound olecanthal helps to block prostaglandins  9) chili peppers- can be eaten or applied topically via capsaicin 10) mint- helpful for headache, muscle aches, joint pain, and itching 11) garlic- reduces inflammation  Link to further information on diet for chronic pain: [email or web address]   2) Anxiety: -continue Lexapro -she is seeing a mental health therapist.  -continue valium suppositories -Discussed exercise and meditation as tools to decrease anxiety. -Recommended Down Dog Yoga app -Discussed spending time outdoors. -Discussed positive re-framing of anxiety.  -Discussed the following foods that have been show to reduce anxiety: 1) [place] nuts, mushrooms, soy beans due to their high selenium content. Upper limit of toxicity of selenium is 424mcg/day so no more than 3-4 [place] nuts per day.  2) Fatty fish such as salmon, mackerel, sardines, trout, and herring- high in omega-3 fatty acids 3) Eggs- increases serotonin and dopamine 4) Pumpkin seeds- high in omega-3 fatty acids 5) dark chocolate- high in flavanols that increase blood flow to brain 6) turmeric- take with black pepper to increase absorption 7) chamomile tea-  antioxidant and anti-inflammatory properties 8) yogurt without sugar- supports gut-brain axis 9) green tea- contains L- theanine 10) blueberries- high in vitamin C and antioxidants 11) [place]- high in tryptophan which gets converted to serotonin 12) bell peppers- rich in vitamin C and antioxidants 13) citrus fruits- rich in vitamin C and  antioxidants 14) almonds- high in vitamin E and healthy fats 15) chia seeds- high in omega-3 fatty acids  3) Sweating profusely at times: -could be autonomic dysfunction.   4) Hypermobility: -discussed with her that I will call as soon as EDS result returns.  -discussed current symptoms -discussed her following with an EDS specialist.   5) Constipation: -continue mag citrate as needed, discussed its health benefits.   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   7) Visual disturbances  8) Low back pain, chronic, without sciatica.  -discussed bracing  9) Throat tightness -referred to SLP and ENT  42 minutes spent in discussion of her experience with sacral neuromodulation, follow-up with GI, urogynceologist, discussed that trying Botox could be beneficial, discussed extracorporeal shockwave therapy, discussed bracing for low back pain, discussed hypermobility, discussed Zynex heating blanket, TENS unit, her response to pelvic floor therapy and bowel retraining, her therapists' recommendations, her support group online.

## 2021-10-12 NOTE — Patient Instructions (Signed)
[email or web address]  Extracorporeal shockwave therapy

## 2021-10-12 NOTE — Addendum Note (Signed)
Addended by: Izora Ribas on: 10/12/2021 12:00 PM   Modules accepted: Level of Service

## 2021-10-15 ENCOUNTER — Ambulatory Visit: Payer: 59 | Attending: Physician Assistant | Admitting: Physical Therapy

## 2021-10-15 ENCOUNTER — Encounter: Payer: Self-pay | Admitting: Physical Therapy

## 2021-10-15 DIAGNOSIS — G8929 Other chronic pain: Secondary | ICD-10-CM | POA: Diagnosis present

## 2021-10-15 DIAGNOSIS — R252 Cramp and spasm: Secondary | ICD-10-CM | POA: Insufficient documentation

## 2021-10-15 DIAGNOSIS — M545 Low back pain, unspecified: Secondary | ICD-10-CM | POA: Insufficient documentation

## 2021-10-15 DIAGNOSIS — R278 Other lack of coordination: Secondary | ICD-10-CM | POA: Insufficient documentation

## 2021-10-15 DIAGNOSIS — R490 Dysphonia: Secondary | ICD-10-CM | POA: Insufficient documentation

## 2021-10-15 NOTE — Therapy (Signed)
OUTPATIENT PHYSICAL THERAPY TREATMENT NOTE   Patient Name: Brittany [name] MRN: [id number] DOB:1990-03-21, 31 y.o., female Today's Date: 10/15/2021  PCP: Inda Coke, PA REFERRING PROVIDER: Inda Coke, PA  END OF SESSION:   PT End of Session - 10/15/21 1703     Visit Number 27    Date for PT Re-Evaluation 10/15/21    Authorization Type [employer]    PT Start Time 8891    PT Stop Time 1700    PT Time Calculation (min) 39 min    Activity Tolerance Patient tolerated treatment well    Behavior During Therapy WFL for tasks assessed/performed                      Past Medical History:  Diagnosis Date   Ankle injury    Anxiety    Eating disorder    Frequent headaches    Past Surgical History:  Procedure Laterality Date   ANKLE SURGERY  2018   HERNIA REPAIR     WISDOM TOOTH EXTRACTION     Patient Active Problem List   Diagnosis Date Noted   Positive ANA (antinuclear antibody) 10/19/2019   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   Anorexia nervosa, restricting type, in full remission, moderate 02/16/2018   History of depression 02/16/2018   GAD (generalized anxiety disorder) 12/16/2017   Pelvic floor dysfunction 12/16/2017   Right hip pain 11/02/2017   Grade 2 ankle sprain, right, initial encounter 05/16/2016   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 It is causing an OCD in me I think.  I have to try to go to the bathroom and then cannot go and then have to do my PT.  My alarm is set at 8 am   PAIN:  Are you having pain? No NPRS scale: (did not  give a number) Pain location: abdomen Pain orientation: Lower rectum  PAIN TYPE: pressure Pain description: constant  Aggravating factors: haven't had a complete bowel movement  Relieving factors: having a full bowel movement      OBJECTIVE: (objective measures completed at initial evaluation unless otherwise dated)       GAIT: Comments: WFL   POSTURE:  Anterior pelvic tilt       PELVIC MMT:   MMT   04/14/2021 06/19/2021  Vaginal      Internal Anal Sphincter 3/5 3/5  External Anal Sphincter 2/5 2/5  Puborectalis 4/5 4/5  (Blank rows = not tested)         PALPATION:   General  tight lumbar paraspinals                 External Perineal Exam normal                             Internal Pelvic Floor TTP of sphincters and difficulty relaxing; can bulge partially; unable to palpate beyond puborectalis   TONE: high   PROLAPSE: None noted in supine  Abdominal measurement around umbilicus - 69IH (03/88/82)  TODAY'S TREATMENT  10/15/21 Biofeedback: rectal sensor deep  squat stretch; sitting position, side lying Self care: discussing how to use the lying position to relax at night as she is not trying to have a bowel movement at that time and the goal is just to relax the muscles  09/08/21 Biofeedback: rectal sensor deep squat stretch; sitting position, side lying Self care: intercourse or outercourse position handout; timing the time she stays in bed  09/01/21 Self care: discussed the things that are working the best and working on progress Working on pushing and bulging pelvic floor while exhaling through your mouth Exercise: Vibration plate hamstring and lumbar stretch - lumbar stretch sitting on vib plate stopped due to feeling uncomfortable       ASSESSMENT:   CLINICAL IMPRESSION: Today's session focused on biofeedback in lying down positions to work on relaxing at night.  Pt has all the tools needed to understand how to relax the pelvic floor at this time and she is  independent with her HEP. Pt recommended to d/c today   OBJECTIVE IMPAIRMENTS decreased coordination, decreased endurance, decreased strength, increased muscle spasms, impaired flexibility, impaired tone, and pain.    ACTIVITY LIMITATIONS  toileting and interpersonal relationships .    PERSONAL FACTORS Time since onset of injury/illness/exacerbation and connective tissue disorder  are also affecting patient's functional outcome.      REHAB POTENTIAL: Good   CLINICAL DECISION MAKING: Evolving/moderate complexity   EVALUATION COMPLEXITY: Moderate     GOALS: Goals reviewed with patient? Yes   SHORT TERM GOALS: Target date: 05/13/2021   Pt will have either dilator or internal sensor to use for treatment Baseline: has both Goal status: MET     LONG TERM GOALS: Target date: 10/02/21   All goals updated 10/15/21      Pt will be able to have at least 3 days / week where she has a good bowel movement Baseline:   Goal status: Not Met    2.  Pt will be independent with advanced HEP to maintain improvements made throughout therapy  Baseline:   Goal status: Met    3.  Pt will report 50% less abdominal bloating and discomfort due to improved muscle tone throughout the core  Baseline: slightly less 75cm down from 77cm abdominal circumference Goal status: Partially met   4.  Pt will report 50% reduction of pain due to improvements in posture, strength, and muscle length  Baseline: too variable to tell Goal status: Not Met    5. Pt will be able to have a bowel movement in the morning with it only taking 2-3 hours in the morning and feeling relief for the rest of the day Baseline: 4-5 hours Goal status: Not Met      PLAN: PT FREQUENCY: 1x/week   PT DURATION: other: 16 (reducing frequency as able)   PLANNED INTERVENTIONS: Therapeutic exercises, Therapeutic activity, Neuromuscular re-education, Balance training, Gait training, Patient/Family education, Joint mobilization, Dry  Needling, Electrical stimulation, Cryotherapy, Moist heat, Taping, Biofeedback, and Manual therapy   PLAN FOR NEXT SESSION:  d/c today  Gustavus Bryant, PT 10/15/21 5:08 PM    PHYSICAL THERAPY DISCHARGE SUMMARY  Visits from Start of Care: 27    Current functional level related to goals / functional outcomes: See above   Remaining deficits: See above   Education / Equipment: HEP   Patient agrees to discharge. Patient goals were met. Patient is being discharged due to being pleased with the current functional level.  Gustavus Bryant, PT 10/15/21 5:08 PM

## 2021-10-26 ENCOUNTER — Encounter: Payer: Self-pay | Admitting: *Deleted

## 2021-10-28 ENCOUNTER — Ambulatory Visit: Payer: 59 | Admitting: Physical Therapy

## 2021-11-02 NOTE — Therapy (Unsigned)
OUTPATIENT SPEECH LANGUAGE PATHOLOGY VOICE EVALUATION   Patient Name: Brittany [name] MRN: [id number] DOB:1990/07/08, 31 y.o., female Today's Date: 11/04/2021  PCP: Inda Coke, PA REFERRING PROVIDER: Izora Ribas, MD   End of Session - 11/04/21 1203     Visit Number 1    Number of Visits 9    Date for SLP Re-Evaluation 12/30/21    SLP Start Time 79    SLP Stop Time  1445    SLP Time Calculation (min) 45 min    Activity Tolerance Patient tolerated treatment well             Past Medical History:  Diagnosis Date   Ankle injury    Anxiety    Eating disorder    Frequent headaches    Past Surgical History:  Procedure Laterality Date   ANKLE SURGERY  2018   HERNIA REPAIR     WISDOM TOOTH EXTRACTION     Patient Active Problem List   Diagnosis Date Noted   Positive ANA (antinuclear antibody) 10/19/2019   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   Anorexia nervosa, restricting type, in full remission, moderate 02/16/2018   History of depression 02/16/2018   GAD (generalized anxiety disorder) 12/16/2017   Pelvic floor dysfunction 12/16/2017   Right hip pain 11/02/2017   Grade 2 ankle sprain, right, initial encounter 05/16/2016   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 Ongoing since 2018. Pt also being treated for pelvic floor dysfunction. Pt dx with anxiety and functional movement disorder.   PAIN:  Are you having pain? Yes: NPRS scale: 3/10 Pain location: abdomin and pelvis floor   FALLS: Has patient  fallen in last 6 months? No, Number of falls: 0  LIVING ENVIRONMENT: Lives with: lives with their family Lives in: House/apartment  PLOF: Independent  PATIENT GOALS: return to singing without reported throat tension  OBJECTIVE:   COGNITION: Overall cognitive status: Impaired  SOCIAL HISTORY: Occupation: works at [employer], reports occupation does not require ongoing speaking throughout the day Water intake: optimal Caffeine/alcohol intake: moderate Daily voice use: moderate  PERCEPTUAL VOICE ASSESSMENT: Voice quality: normal Vocal abuse: habitual throat clearing and abnormal breathing pattern Resonance: normal Respiratory function: thoracic breathing     OBJECTIVE VOICE ASSESSMENT: Maximum phonation time for sustained "ah": 17 Conversational pitch average: 190 Hz (WNL) Conversational pitch range: 81-371 Hz Conversational loudness average: 70 dB (WNL) Conversational loudness range: 64-79 dB S/z ratio: 1.11 (Suggestive of dysfunction >1.0)  COMMENTS: voice free from hoarseness today, with pt denying pain when speaking or fatigue with daily use. Primary complaint is re: singing abilities. Tells SLP she has increased tension when singing with notable decrease in vocal range and quality. Pt uses singing to manage stress and is primary enjoyed activity. Pt has decreased singing significantly since 2018, per pt report. Pt tells ST she used to be able to "relax" throat when singing, is no longer able to do. If feels tension, declines to sing, avoiding preferred activity. Feels like tension and pain caused by pt reported hypermobility ligament disorder attributing to tension when singing.   PATIENT REPORTED  OUTCOME MEASURES (PROM): V-RQOL: to be completed first session   TODAY'S TREATMENT:   11-03-21: Education provided on evaluation results and SLP's recommendations. Initiated training re: vocal mechanism and voicing system. Pt verbalizes agreement with POC, all questions  answered to satisfaction. SLP educated pt on recommend to persue laryngeal evaluation from ENT. Pt agreeable, will continue to attempt having that appointment scheduled. Verbalizes understanding that SLP has limited insight into mechanics of larynx without imaging but tells SLP she desires to initiate therapy regardless.    PATIENT EDUCATION: Education details: see above Person educated: Patient Education method: [employer] Technology [employer] Education comprehension: verbalized understanding  HOME EXERCISE PROGRAM: To be established  GOALS: Goals reviewed with patient? Yes  SHORT TERM GOALS: Target date: 12/08/2021  . The patient will independently implement laryngeal hygiene recommendations 6/7 days over 1 week period  Baseline: Goal status: INITIAL  2.  The patient will perform the introduced head/neck stretches and laryngeal massage to reduce tension which may attribute to discomfort when singing with rare min-A Baseline:  Goal status: INITIAL  3.  The patient will perform diaphragmatic (low abdominal) respiration to power sustained vowel (singing) to optimize voicing efficiency and decrease laryngeal hyperfunction in  80% of trials with rare min-A Baseline:  Goal status: INITIAL  4.  The patient will demonstrate all introduced vocal warm up exercises to optimize laryngeal function prior to singingwith rare min-A  Baseline:  Goal status: INITIAL   LONG TERM GOALS: Target date: 12/29/2021  The patient will report daily HEP adherence (BID recommended) over 1 week period with mod-I Baseline:  Goal status: INITIAL  2.  The patient will implement tension reduction techniques with mod-I when sensation of hyperfunction occurs to facilitate return to singing in same practice block with 80% accuracy Baseline:  Goal status: INITIAL  3.  The patient will report carryover of tension reduction techniques and vocal warm up exercises with mod-I over 1 week period Baseline:  Goal status:  INITIAL  4.  The patient will perform diaphragmatic (low abdominal) respiration to power singing over 1 verse to optimize voicing efficiency and decrease laryngeal hyperfunction in  80% of trials with rare min-A Baseline:  Goal status: INITIAL  5.  The patient will report subjective perception of improvement in ability to engage in preferred activity via goal attainment scaling by d/c Baseline:  Goal status: INITIAL   ASSESSMENT:  CLINICAL IMPRESSION: Patient is a 31 y.o. F who was seen today for voice evaluation. PMHx significant for pelvic floor dysfunction, anxiety, and pt reported OCD. Pt with WNL voice during evaluation, when speaking. Denies vocal fatigue or pain when speaking. Endorses inability to sing at present d/t increased tension when singing. Negatively impacting QoL, as pt enjoys singing and previously used it as a stress reduction technique. Skilled interventions will target using tension reduction techniques and optimizing efficacy of voicing system to enable ability to sing.   OBJECTIVE IMPAIRMENTS: include voice disorder. These impairments are limiting patient from  participating in important activity, reported as essential for QoL and management of stress . Factors affecting potential to achieve goals and functional outcome are co-morbidities and previous level of function.. Patient will benefit from skilled SLP services to address above impairments and improve overall function.  REHAB POTENTIAL: Fair    PLAN: SLP FREQUENCY: 1-2x/week  SLP DURATION: 8 weeks  PLANNED INTERVENTIONS: Cueing hierachy, Internal/external aids, Functional tasks, and SLP instruction and feedback    Maia Breslow, CCC-SLP 11/04/2021, 12:05 PM

## 2021-11-03 ENCOUNTER — Other Ambulatory Visit: Payer: Self-pay

## 2021-11-03 ENCOUNTER — Ambulatory Visit: Payer: 59 | Admitting: Speech Pathology

## 2021-11-03 ENCOUNTER — Encounter: Payer: Self-pay | Admitting: Speech Pathology

## 2021-11-03 DIAGNOSIS — R278 Other lack of coordination: Secondary | ICD-10-CM | POA: Diagnosis not present

## 2021-11-03 DIAGNOSIS — R490 Dysphonia: Secondary | ICD-10-CM

## 2021-11-16 ENCOUNTER — Ambulatory Visit: Payer: 59 | Attending: Physician Assistant | Admitting: Speech Pathology

## 2021-11-16 DIAGNOSIS — R490 Dysphonia: Secondary | ICD-10-CM | POA: Diagnosis not present

## 2021-11-16 NOTE — Therapy (Signed)
OUTPATIENT SPEECH LANGUAGE PATHOLOGY TREATMENT NOTE   Patient Name: Brittany [name] MRN: [id number] DOB:10-Oct-1990, 31 y.o., female Today's Date: 11/16/2021  PCP: Jarold Motto, PA REFERRING PROVIDER: Horton Chin, MD   End of Session - 11/16/21 1111     Visit Number 2    Number of Visits 9    Date for SLP Re-Evaluation 12/30/21    SLP Start Time 1111    SLP Stop Time  1145    SLP Time Calculation (min) 34 min    Activity Tolerance Patient tolerated treatment well              Past Medical History:  Diagnosis Date   Ankle injury    Anxiety    Eating disorder    Frequent headaches    Past Surgical History:  Procedure Laterality Date   ANKLE SURGERY  2018   HERNIA REPAIR     WISDOM TOOTH EXTRACTION     Patient Active Problem List   Diagnosis Date Noted   Positive ANA (antinuclear antibody) 10/19/2019   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   Anorexia nervosa, restricting type, in full remission, moderate 02/16/2018   History of depression 02/16/2018   GAD (generalized anxiety disorder) 12/16/2017   Pelvic floor dysfunction 12/16/2017   Right hip pain 11/02/2017   Grade 2 ankle sprain, right, initial encounter 05/16/2016   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 Yes: NPRS scale: 3/10 Pain location: abdomin and pelvic floor   OBJECTIVE:   PATIENT REPORTED OUTCOME MEASURES (PROM): Goal Attainment Scaling: Current: singing very little with limited range and control Goal: knowledge and reliable tactics  which enable ability to sing more often  Exceed the goal: increased range, increased control   TODAY'S TREATMENT:   11-16-21: SLP facilitated goal attainment scaling see above. SLP introduced and demonstrated larngeal massage to aid in decreased tension in laryngeal area. Pt able to teach back via demonstration with min-A. SLP provides direct instruction for semi-occluded vocal tract exercises. Pt able to demonstrate up to pitch siren level with rare min-A. SLP provides encouragement to use low abdominal breath to power vowel through provided instrument.    11-03-21: Education provided on evaluation results and SLP's recommendations. Initiated training re: vocal mechanism and voicing system. Pt verbalizes agreement with POC, all questions answered to satisfaction. SLP educated pt on recommend to persue laryngeal evaluation from ENT. Pt agreeable, will continue to attempt having that appointment scheduled. Verbalizes understanding that SLP has limited insight into mechanics of larynx without imaging but tells SLP she desires to initiate therapy regardless.    PATIENT EDUCATION: Education details: see above Person educated: Patient Education method: Chief Technology Officer Education comprehension: verbalized understanding  HOME EXERCISE PROGRAM: SOVTE  GOALS: Goals reviewed with patient? Yes  SHORT TERM GOALS: Target date: 12/08/2021  . The patient will independently implement laryngeal hygiene recommendations 6/7 days over 1 week period  Baseline: Goal status: INITIAL  2.  The patient will perform the introduced head/neck stretches and laryngeal massage to reduce tension which may attribute to discomfort when singing  with rare min-A Baseline:  Goal status: INITIAL  3.  The patient will perform diaphragmatic (low abdominal) respiration to power sustained vowel (singing) to optimize voicing efficiency and decrease laryngeal hyperfunction in  80% of trials with rare min-A Baseline:  Goal  status: INITIAL  4.  The patient will demonstrate all introduced vocal warm up exercises to optimize laryngeal function prior to singingwith rare min-A  Baseline:  Goal status: INITIAL   LONG TERM GOALS: Target date: 12/29/2021  The patient will report daily HEP adherence (BID recommended) over 1 week period with mod-I Baseline:  Goal status: INITIAL  2.  The patient will implement tension reduction techniques with mod-I when sensation of hyperfunction occurs to facilitate return to singing in same practice block with 80% accuracy Baseline:  Goal status: INITIAL  3.  The patient will report carryover of tension reduction techniques and vocal warm up exercises with mod-I over 1 week period Baseline:  Goal status: INITIAL  4.  The patient will perform diaphragmatic (low abdominal) respiration to power singing over 1 verse to optimize voicing efficiency and decrease laryngeal hyperfunction in  80% of trials with rare min-A Baseline:  Goal status: INITIAL  5.  The patient will report subjective perception of improvement in ability to engage in preferred activity via goal attainment scaling by d/c Baseline:  Goal status: INITIAL   ASSESSMENT:  CLINICAL IMPRESSION: Patient is a 31 y.o. F who was seen today for voice evaluation. PMHx significant for pelvic floor dysfunction, anxiety, and pt reported OCD. Pt with WNL voice during evaluation, when speaking. Denies vocal fatigue or pain when speaking. Endorses inability to sing at present d/t increased tension when singing. Negatively impacting QoL, as pt enjoys singing and previously used it as a stress reduction technique. Skilled interventions will target using tension reduction techniques and optimizing efficacy of voicing system to enable ability to sing.   OBJECTIVE IMPAIRMENTS: include voice disorder. These impairments are limiting patient from  participating in important activity, reported as essential for QoL and management of  stress . Factors affecting potential to achieve goals and functional outcome are co-morbidities and previous level of function.. Patient will benefit from skilled SLP services to address above impairments and improve overall function.  REHAB POTENTIAL: Fair    PLAN: SLP FREQUENCY: 1-2x/week  SLP DURATION: 8 weeks  PLANNED INTERVENTIONS: Cueing hierachy, Internal/external aids, Functional tasks, and SLP instruction and feedback    Maia Breslow, CCC-SLP 11/16/2021, 11:11 AM

## 2021-11-23 ENCOUNTER — Ambulatory Visit: Payer: 59 | Admitting: Speech Pathology

## 2021-12-02 ENCOUNTER — Ambulatory Visit: Payer: 59 | Admitting: Speech Pathology

## 2021-12-07 ENCOUNTER — Ambulatory Visit: Payer: 59 | Attending: Physician Assistant | Admitting: Speech Pathology

## 2021-12-07 DIAGNOSIS — R490 Dysphonia: Secondary | ICD-10-CM | POA: Diagnosis present

## 2021-12-07 NOTE — Patient Instructions (Signed)
Tongue stretch: Using a towel or tissue, hold your tongue and pull it out slowly, toward your chin. Hold it for a few seconds, and repeat this process until you feel properly loose.   Tongue twisters: Many mumbling mice making mighty music in the moonlight   Root stretch: Press entire tongue to roof of mouth and drop jaw until you feel gentle stretch

## 2021-12-07 NOTE — Therapy (Signed)
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  Rationale for Evaluation and Treatment Rehabilitation  SUBJECTIVE:   SUBJECTIVE STATEMENT: "I had to cancel two appointments"   PAIN:  Are you having pain? Denies, reports "general discomfort"    OBJECTIVE:   PATIENT REPORTED OUTCOME MEASURES (PROM): Goal Attainment Scaling: Current: singing very little with limited range and control Goal: knowledge and reliable tactics which enable ability to sing more often  Exceed the goal: increased range, increased control   TODAY'S TREATMENT:   12-07-21: SLP provided education on voicing mechanism to include the larynx and it's rest position without excess muscle tension to answer pt's questions upon entering session. Use of biofeedback to ID laryngeal elevation  during swallow vs during inhalation. Pt agrees that larynx does not raise with inhalation despite her internal perception it does. Led pt through glide exercise from low to high with sing-speak voice to reveal competence of ability to sing without pain or strain. Reviewed HEP recommendations and updated based on today's interventions. Added two sessions d/t pt missing 2 sessions.   11-16-21: SLP facilitated goal attainment scaling see above. SLP introduced and demonstrated larngeal massage to aid in decreased tension in laryngeal area. Pt able to teach back via demonstration with min-A. SLP provides direct instruction for semi-occluded vocal tract exercises. Pt able to demonstrate up to pitch siren level with rare min-A. SLP provides encouragement to use low abdominal breath to power vowel through provided instrument.    11-03-21: Education provided on evaluation results and SLP's recommendations. Initiated training re: vocal mechanism and voicing system. Pt verbalizes agreement with POC, all questions answered to satisfaction. SLP educated pt on recommend to persue laryngeal evaluation from ENT. Pt agreeable, will continue to attempt having that appointment scheduled. Verbalizes understanding that SLP has limited insight into mechanics of larynx without imaging but tells SLP she desires to initiate therapy regardless.    PATIENT EDUCATION: Education details: see above Person educated: Patient Education method: Chief Technology Officer Education comprehension: verbalized understanding  HOME EXERCISE PROGRAM: SOVTE  GOALS: Goals reviewed with patient? Yes  SHORT TERM GOALS: Target date: 12/08/2021  . The patient will independently implement laryngeal hygiene recommendations 6/7 days over 1 week period  Baseline: Goal status: INITIAL  2.  The patient will perform the introduced head/neck stretches and laryngeal massage  to reduce tension which may attribute to discomfort when singing with rare  min-A Baseline:  Goal status: INITIAL  3.  The patient will perform diaphragmatic (low abdominal) respiration to power sustained vowel (singing) to optimize voicing efficiency and decrease laryngeal hyperfunction in  80% of trials with rare min-A Baseline:  Goal status: INITIAL  4.  The patient will demonstrate all introduced vocal warm up exercises to optimize laryngeal function prior to singingwith rare min-A  Baseline:  Goal status: INITIAL   LONG TERM GOALS: Target date: 12/29/2021  The patient will report daily HEP adherence (BID recommended) over 1 week period with mod-I Baseline:  Goal status: INITIAL  2.  The patient will implement tension reduction techniques with mod-I when sensation of hyperfunction occurs to facilitate return to singing in same practice block with 80% accuracy Baseline:  Goal status: INITIAL  3.  The patient will report carryover of tension reduction techniques and vocal warm up exercises with mod-I over 1 week period Baseline:  Goal status: INITIAL  4.  The patient will perform diaphragmatic (low abdominal) respiration to power singing over 1 verse to optimize voicing efficiency and decrease laryngeal hyperfunction in  80% of trials with rare min-A Baseline:  Goal status: INITIAL  5.  The patient will report subjective perception of improvement in ability to engage in preferred activity via goal attainment scaling by d/c Baseline:  Goal status: INITIAL   ASSESSMENT:  CLINICAL IMPRESSION: Patient is a 31 y.o. F who was seen today for voice evaluation. PMHx significant for pelvic floor dysfunction, anxiety, and pt reported OCD. Pt with WNL voice during evaluation, when speaking. Denies vocal fatigue or pain when speaking. Endorses inability to sing at present d/t increased tension when singing. Negatively impacting QoL, as pt enjoys singing and previously used it as a stress reduction technique. Skilled interventions will target using tension  reduction techniques and optimizing efficacy of voicing system to enable ability to sing.   OBJECTIVE IMPAIRMENTS: include voice disorder. These impairments are limiting patient from  participating in important activity, reported as essential for QoL and management of stress . Factors affecting potential to achieve goals and functional outcome are co-morbidities and previous level of function.. Patient will benefit from skilled SLP services to address above impairments and improve overall function.  REHAB POTENTIAL: Fair    PLAN: SLP FREQUENCY: 1-2x/week  SLP DURATION: 8 weeks  PLANNED INTERVENTIONS: Cueing hierachy, Internal/external aids, Functional tasks, and SLP instruction and feedback    Maia Breslow, CCC-SLP 12/07/2021, 12:10 PM

## 2021-12-11 NOTE — Therapy (Unsigned)
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  Rationale for Evaluation and Treatment Rehabilitation  SUBJECTIVE:   SUBJECTIVE STATEMENT: ***  PAIN:  Are you having pain? Denies, reports "general discomfort"    OBJECTIVE:   PATIENT REPORTED OUTCOME MEASURES (PROM): Goal Attainment Scaling: Current: singing very little with limited range and control Goal: knowledge and reliable tactics which enable ability to sing more often  Exceed the goal: increased range, increased control   TODAY'S TREATMENT:   12-14-21: ***  12-07-21: SLP provided education on voicing mechanism to include the larynx and it's rest position without excess muscle tension to answer pt's questions upon entering session. Use of biofeedback to ID laryngeal elevation during swallow  vs during inhalation. Pt agrees that larynx does not raise with inhalation despite her internal perception it does. Led pt through glide exercise from low to high with sing-speak voice to reveal competence of ability to sing without pain or strain. Reviewed HEP recommendations and updated based on today's interventions. Added two sessions d/t pt missing 2 sessions.   11-16-21: SLP facilitated goal attainment scaling see above. SLP introduced and demonstrated larngeal massage to aid in decreased tension in laryngeal area. Pt able to teach back via demonstration with min-A. SLP provides direct instruction for semi-occluded vocal tract exercises. Pt able to demonstrate up to pitch siren level with rare min-A. SLP provides encouragement to use low abdominal breath to power vowel through provided instrument.    11-03-21: Education provided on evaluation results and SLP's recommendations. Initiated training re: vocal mechanism and voicing system. Pt verbalizes agreement with POC, all questions answered to satisfaction. SLP educated pt on recommend to persue laryngeal evaluation from ENT. Pt agreeable, will continue to attempt having that appointment scheduled. Verbalizes understanding that SLP has limited insight into mechanics of larynx without imaging but tells SLP she desires to initiate therapy regardless.    PATIENT EDUCATION: Education details: see above Person educated: Patient Education method: Theatre stage manager Education comprehension: verbalized understanding  HOME EXERCISE PROGRAM: SOVTE  GOALS: Goals reviewed with patient? Yes  SHORT TERM GOALS: Target date: 12/08/2021  . The patient will independently implement laryngeal hygiene recommendations 6/7 days over 1 week period  Baseline: *** Goal status: INITIAL  2.  The patient will perform the introduced head/neck stretches and laryngeal massage to reduce  tension which may attribute to discomfort when singing with rare  min-A Baseline: 12-07-21 Goal status: PARTIALLY MET, required mod-A following re-instruction to demonstrate circumlaryngeal massage   3.  The patient will perform diaphragmatic (low abdominal) respiration to power sustained vowel (singing) to optimize voicing efficiency and decrease laryngeal hyperfunction in  80% of trials with rare min-A Baseline: 12-07-21 Goal status: NOT MET  4.  The patient will demonstrate all introduced vocal warm up exercises to optimize laryngeal function prior to singingwith rare min-A  Baseline: 12-07-21 Goal status: NOT MET   LONG TERM GOALS: Target date: 12/29/2021  The patient will report daily HEP adherence (BID recommended) over 1 week period with mod-I Baseline: 12-14-21 Goal status: MET  2.  The patient will implement tension reduction techniques with mod-I when sensation of hyperfunction occurs to facilitate return to singing in same practice block with 80% accuracy Baseline:  Goal status: INITIAL  3.  The patient will report carryover of tension reduction techniques and vocal warm up exercises with mod-I over 1 week period Baseline:  Goal status: INITIAL  4.  The patient will perform diaphragmatic (low abdominal) respiration to power singing over 1 verse to optimize voicing efficiency and decrease laryngeal hyperfunction in  80% of trials with rare min-A Baseline:  Goal status: INITIAL  5.  The patient will report subjective perception of improvement in ability to engage in preferred activity via goal attainment scaling by d/c Baseline:  Goal status: INITIAL   ASSESSMENT:  CLINICAL IMPRESSION: Patient is a 31 y.o. F who was seen today for voice evaluation. PMHx significant for pelvic floor dysfunction, anxiety, and pt reported OCD. Pt with WNL voice during evaluation, when speaking. Denies vocal fatigue or pain when speaking. Endorses inability to sing at present d/t increased tension when singing. Negatively impacting QoL, as pt enjoys singing  and previously used it as a stress reduction technique. Skilled interventions will target using tension reduction techniques and optimizing efficacy of voicing system to enable ability to sing.   OBJECTIVE IMPAIRMENTS: include voice disorder. These impairments are limiting patient from  participating in important activity, reported as essential for QoL and management of stress . Factors affecting potential to achieve goals and functional outcome are co-morbidities and previous level of function.. Patient will benefit from skilled SLP services to address above impairments and improve overall function.  REHAB POTENTIAL: Fair    PLAN: SLP FREQUENCY: 1-2x/week  SLP DURATION: 8 weeks  PLANNED INTERVENTIONS: Cueing hierachy, Internal/external aids, Functional tasks, and SLP instruction and feedback    Su Monks, CCC-SLP 12/11/2021, 10:26 AM

## 2021-12-14 ENCOUNTER — Ambulatory Visit: Payer: 59 | Admitting: Speech Pathology

## 2021-12-14 DIAGNOSIS — R490 Dysphonia: Secondary | ICD-10-CM

## 2021-12-14 NOTE — Patient Instructions (Signed)
Vocal Function Exercises  WARM UP Hold out nasal "eee" as long and you can with good quality (musical note F above middle C) Remember: STRONG, STEADY Feel the sound in your face, not your throat If you have excess tension in your throat, try pushing hands together at your chest  STRETCH Glide from a low note to a high note Use nasal "eee" Maintain your good quality sound that we practiced in therapy CONTRACT  Glide from a high note to a low note Use nasal "eee" Maintain your good quality sound that we practiced in therapy SUSTAINED PHONATION  Use nasal "eee" Hold out as long as possible at low note Hold out as long as possible at mid note Hold out as long as possible at high note  Complete each part 2x, 3x a day   Remember these are exercises! They are supposed to be challenging!!  GOAL for all: no pitch breaks, clear voice (no hoarseness, strain, or roughness)

## 2021-12-15 ENCOUNTER — Encounter: Payer: Self-pay | Admitting: Physical Medicine and Rehabilitation

## 2021-12-15 ENCOUNTER — Encounter: Payer: 59 | Attending: Physical Medicine and Rehabilitation | Admitting: Physical Medicine and Rehabilitation

## 2021-12-15 VITALS — BP 108/69 | HR 72 | Ht 64.0 in | Wt 117.0 lb

## 2021-12-15 DIAGNOSIS — R0989 Other specified symptoms and signs involving the circulatory and respiratory systems: Secondary | ICD-10-CM | POA: Diagnosis present

## 2021-12-15 DIAGNOSIS — M6289 Other specified disorders of muscle: Secondary | ICD-10-CM | POA: Insufficient documentation

## 2021-12-15 NOTE — Patient Instructions (Signed)
Vocal rehabilitation SLP

## 2021-12-15 NOTE — Progress Notes (Signed)
Subjective:    Patient ID: Brittany [name], female    DOB: December 26, 1990, 31 y.o.   MRN: [id number]  HPI  Brittany [name] is a 31 year old woman who presents for f/u of chronic pelvic pain.  1) Pelvic floor dysfunction: -they second neuromodulation attempt did not help and was very painful. She now has scars on her buttocks -pain has been stable since last visit -he did not have time to look into ECSWT much -her mental health is currently helping her the most.  -she is concerned that bowel retraining will not work. This is supposed to be when she only allows herself to be in the bathroom for a certain amount of time and number of times during the day.  -when she can get both her smooth and skeletal muscles to relax for her to go, it only lasts so long and she still has incomplete evacuation.  -she has good days and bad days.  -she is able to appreciate the good days -she asks how to get the medical records of her MRI to share with her therapist -she will be trying biofeedback today -she is trying dry needling in the pelvic flood and is seeing a pelvic floor therapist to do rectal biofeedback and trying rectal dilators. She has progressed in that she can use bigger ones.  -She has done PT which she feels is a temporary band aid.  -Average pain is 7/10 -She feels that some, but not all of the pain is from anxiety. 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  -pain has been more bearable -she is trying to find a lot of mental health help -she is continuing with therapy -pain is still very bad often and extremely life-limiting.  -she has benefited from working with chiropractor -she is very limited in what she can physically do -sh has tried valium suppositories -she uses a splint and release tool and it helps to keep the prolapse in place to that she can have a more normal BM.   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  -she saw a SLP who would like her to see an ENT  3) Lower back pain -improved with chiropractor  4) Anxiety -is trying valium suppositories  5) Visual disturbances: -worse with stress -discussed with her father who has floaters and she has a family history of migraines  6) Constipation -still severe without improvement  Pain Inventory Average Pain 4 Pain Right Now 4 My pain is intermittent, dull, stabbing, and aching  In the last 24 hours, has pain interfered with the following? General activity 7 Relation with others 7 Enjoyment of life 8 What TIME of day is your pain at its worst? daytime and evening Sleep (in general) Fair  Pain is worse with: inactivity and unsure Pain improves with: therapy/exercise Relief from Meds: none taken        Family History  Problem Relation Age of Onset   Depression Mother    Anxiety disorder Mother    Depression Brother    Anxiety disorder Brother    Diabetes Maternal Grandmother    Hearing  loss Maternal Grandmother    Alcohol abuse Maternal Grandfather    Depression Maternal Grandfather    Diabetes Maternal Grandfather    Dementia Paternal Grandfather    Thyroid disease Brother    Social History   Socioeconomic History   Marital status: Married    Spouse name: Not on file   Number of children: Not on file   Years of education: Not on file   Highest education level: Not on file  Occupational History   Not on file  Tobacco Use   Smoking status: Never   Smokeless tobacco: Never  Vaping Use   Vaping Use: Never used  Substance and Sexual Activity   Alcohol use: No    Alcohol/week: 0.0 standard drinks of alcohol   Drug use: No    Sexual activity: Yes    Birth control/protection: None  Other Topics Concern   Not on file  Social History Narrative   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[place]   Social Determinants of Health   Financial Resource Strain: Not on file  Food Insecurity: Not on file  Transportation Needs: Not on file  Physical Activity: Not on file  Stress: Not on file  Social Connections: Not on file   Past Surgical History:  Procedure Laterality Date   ANKLE SURGERY  2018   HERNIA REPAIR     WISDOM TOOTH EXTRACTION     Past Medical History:  Diagnosis Date   Ankle injury    Anxiety    Eating disorder    Frequent headaches    BP 108/69   Pulse 72   Ht 5\' 4"  (1.626 m)   Wt 117 lb (53.1 kg)   SpO2 98%   BMI 20.08 kg/m   Opioid Risk Score:   Fall Risk Score:  `1  Depression screen [facility] 2/9     10/12/2021   11:38 AM 06/09/2021   10:59 AM 10/24/2020    2:48 PM 08/26/2020    2:03 PM 07/22/2020    2:18 PM 04/07/2020    2:06 PM 04/02/2019    8:38 AM  Depression screen PHQ 2/9  Decreased Interest 0 0 1 1 1 1  0  Down, Depressed, Hopeless 0 0 2 1 1 1 1   PHQ - 2 Score 0 0 3 2 2 2 1   Altered sleeping   1   1 0  Tired, decreased energy   1   1 1   Change in appetite   0   0 0  Feeling bad or failure about yourself    3   3 3   Trouble concentrating   3   3 2   Moving slowly or fidgety/restless   1   1 0  Suicidal thoughts   0   1 2  PHQ-9 Score   12   12 9   Difficult doing work/chores   Very difficult   Extremely dIfficult Somewhat difficult   Review of Systems  Constitutional: Negative.        Night sweats  HENT: Negative.    Eyes: Negative.   Respiratory: Negative.    Cardiovascular: Negative.   Gastrointestinal:  Positive for abdominal pain, constipation and diarrhea.  Endocrine: Negative.   Musculoskeletal:  Positive for arthralgias, back pain and neck pain.       Stomach and pelvic area  Skin: Negative.   Allergic/Immunologic: Negative.   Psychiatric/Behavioral:  The patient is  nervous/anxious.   All other systems reviewed and are negative.      Objective:  Physical Exam Gen: no distress, normal appearing, BMI 20.08, weight 117 lbs, 108/69 HEENT: oral mucosa pink and moist, NCAT Cardio: Reg rate Chest: normal effort, normal rate of breathing Abd: soft, non-distended Ext: no edema Psych: pleasant, normal affect, discusses her anxieties regarding her pain and bowel regimen, OCD tendencies Skin: intact, red areas on chest Neuro: Alert and oriented x3  Assessment & Plan:  Brittany [name] is a 31 year old woman who presents for follow-up of pelvic floor dysfunction.  1) Pelvic floor dysfunction: -discussed her negative response to enema.  -she takes magnesium citrate and even then she cannot have complete evacuation. -encouraged rectal biofeedback today -discussed therapy is not helping enough -discussed her pelvic floor therapy -discussed extracorporeal shockwave therapy -discussed that MRI results are normal, back pain is likely ligamentous/muscular -discussed that she can request MRI records by calling medical records -continue pelvic floor PT, rectal biofeedback, progressive rectal dilation.  -once in a while she has bowel incontinence -She has pelvic floor pain and has had great benefit from pelvic floor therapists.  -discussed pudendal nerve entrapment, prolotherapy. Provided link for further education.  -discussed TCA. -discussed Botox as an option -continue magnesium citrate.  -this has a huge impact on her quality of life.  -discussed anti-spasticity medications and Botox. Extensively discussed the benefits of Botox.  -Provided with a pain relief journal and discussed that it contains foods and lifestyle tips to naturally help to improve pain. Discussed that these lifestyle strategies are also very good for health unlike some medications which can have negative side effects. Discussed that the act of keeping a journal can be therapeutic and helpful to  realize patterns what helps to trigger and alleviate pain.   -continue internal massaging -Discussed current symptoms of pain and history of pain.  -Discussed benefits of exercise in reducing pain. -Discussed following foods that may reduce pain: 1) Ginger (especially studied for arthritis)- reduce leukotriene production to decrease inflammation 2) Blueberries- high in phytonutrients that decrease inflammation 3) Salmon- marine omega-3s reduce joint swelling and pain 4) Pumpkin seeds- reduce inflammation 5) dark chocolate- reduces inflammation 6) turmeric- reduces inflammation 7) tart cherries - reduce pain and stiffness 8) extra virgin olive oil - its compound olecanthal helps to block prostaglandins  9) chili peppers- can be eaten or applied topically via capsaicin 10) mint- helpful for headache, muscle aches, joint pain, and itching 11) garlic- reduces inflammation  Link to further information on diet for chronic pain: [email or web address]   2) Anxiety: -continue Lexapro -she is seeing a mental health therapist.  -continue valium suppositories -Discussed exercise and meditation as tools to decrease anxiety. -Recommended Down Dog Yoga app -Discussed spending time outdoors. -Discussed positive re-framing of anxiety.  -Discussed the following foods that have been show to reduce anxiety: 1) [place] nuts, mushrooms, soy beans due to their high selenium content. Upper limit of toxicity of selenium is 489mcg/day so no more than 3-4 [place] nuts per day.  2) Fatty fish such as salmon, mackerel, sardines, trout, and herring- high in omega-3 fatty acids 3) Eggs- increases serotonin and dopamine 4) Pumpkin seeds- high in omega-3 fatty acids 5) dark chocolate- high in flavanols that increase blood flow to brain 6) turmeric- take with black pepper to increase absorption 7) chamomile tea- antioxidant and anti-inflammatory  properties 8) yogurt without sugar- supports gut-brain axis 9) green tea- contains L- theanine 10) blueberries- high in vitamin C and antioxidants 11) [place]- high in tryptophan which gets converted to serotonin 12) bell peppers- rich in vitamin  C and antioxidants 13) citrus fruits- rich in vitamin C and antioxidants 14) almonds- high in vitamin E and healthy fats 15) chia seeds- high in omega-3 fatty acids  3) Sweating profusely at times: -could be autonomic dysfunction.   4) Hypermobility: -discussed with her that I will call as soon as EDS result returns.  -discussed current symptoms -discussed her following with an EDS specialist.   5) Constipation: -continue mag citrate as needed, discussed its health benefits.   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   7) Visual disturbances  8) Low back pain, chronic, without sciatica.  -discussed bracing  9) Throat tightness -referred to SLP and ENT -discussed looking for a vocal rehabilitation expert

## 2021-12-23 ENCOUNTER — Ambulatory Visit: Payer: 59 | Admitting: Speech Pathology

## 2021-12-23 DIAGNOSIS — R490 Dysphonia: Secondary | ICD-10-CM

## 2021-12-23 NOTE — Therapy (Signed)
OUTPATIENT SPEECH LANGUAGE PATHOLOGY TREATMENT NOTE   Patient Name: Brittany [name] MRN: [id number] DOB:17-Sep-1990, 31 y.o., female Today's Date: 12/23/2021  PCP: Inda Coke, PA REFERRING PROVIDER: Izora Ribas, MD   End of Session - 12/23/21 1449     Visit Number 5    Number of Visits 9    Date for SLP Re-Evaluation 12/30/21    SLP Start Time 1450    SLP Stop Time  1530    SLP Time Calculation (min) 40 min    Activity Tolerance Patient tolerated treatment well               Past Medical History:  Diagnosis Date   Ankle injury    Anxiety    Eating disorder    Frequent headaches    Past Surgical History:  Procedure Laterality Date   ANKLE SURGERY  2018   HERNIA REPAIR     WISDOM TOOTH EXTRACTION     Patient Active Problem List   Diagnosis Date Noted   Positive ANA (antinuclear antibody) 10/19/2019   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   Anorexia nervosa, restricting type, in full remission, moderate 02/16/2018   History of depression 02/16/2018   GAD (generalized anxiety disorder) 12/16/2017   Pelvic floor dysfunction 12/16/2017   Right hip pain 11/02/2017   Grade 2 ankle sprain, right, initial encounter 05/16/2016   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 2.5/10 (abdomin, R hip, pelvic floor), slight discomfort in throat and neck    OBJECTIVE:   PATIENT REPORTED OUTCOME MEASURES (PROM): Goal Attainment Scaling: Current: singing very little with limited range and control Goal: knowledge and reliable tactics which enable ability to  sing more often  Exceed the goal: increased range, increased control   TODAY'S TREATMENT:   12-23-21:Targeted improving vocal quality in singing through Vocal Function Exercises, exercises designed to strengthen and coordinate voicing musculature. SLP provided re-education on purpose of exercises and demonstrates all four steps: warm up, stretch, contract, sustained phonation. Pt able to demonstrate all x3 with usual feedback to optimize performance. Improved performance vs last session with alteration of target sound. Use of /mo/ with rounded lip posture to incorporate resonant voice and semi occluded vocal tract principles. Pt with improved pitch glides without evidence of voice breaks present prior session. Pt then able to sing at varying ranges a target phrase, heavy with /m/, without evidence of hoarseness, aphonia, breathiness. Clear vocal productions.   12-14-21: Pt c/o ongoing "throat tightness." SLP re-educated on laryngeal massage and stretches to release tension. Targeted improving vocal quality in singing through Vocal Function Exercises, exercises designed to strengthen and coordinate voicing musculature. SLP educated on purpose of exercises and demonstrates all four steps: warm up, stretch, contract, sustained phonation. Pt able to demonstrate all x3 with usual feedback to optimize performance. Generate key words to faciliate carryover of like-practice at home. Pt to focus on generating strong and steady voicing, as most prominent feature during today's practice and her singing is a sense of holding back and lack of power with voicing. Pt agrees this may be d/t heightened introspection and comparison  of what voice used to sound like. Perceptually, pt's voice is free from strain, roughness and hoarseness. Maintaining clear voicing across pitch ranges. Rare pitch breaks during glides (3/8 trials). Sings song she wrote, maintaining clear vocal quality. Update HEP.   12-07-21: SLP provided education  on voicing mechanism to include the larynx and it's rest position without excess muscle tension to answer pt's questions upon entering session. Use of biofeedback to ID laryngeal elevation during swallow vs during inhalation. Pt agrees that larynx does not raise with inhalation despite her internal perception it does. Led pt through glide exercise from low to high with sing-speak voice to reveal competence of ability to sing without pain or strain. Reviewed HEP recommendations and updated based on today's interventions. Added two sessions d/t pt missing 2 sessions.   11-16-21: SLP facilitated goal attainment scaling see above. SLP introduced and demonstrated larngeal massage to aid in decreased tension in laryngeal area. Pt able to teach back via demonstration with min-A. SLP provides direct instruction for semi-occluded vocal tract exercises. Pt able to demonstrate up to pitch siren level with rare min-A. SLP provides encouragement to use low abdominal breath to power vowel through provided instrument.    11-03-21: Education provided on evaluation results and SLP's recommendations. Initiated training re: vocal mechanism and voicing system. Pt verbalizes agreement with POC, all questions answered to satisfaction. SLP educated pt on recommend to persue laryngeal evaluation from ENT. Pt agreeable, will continue to attempt having that appointment scheduled. Verbalizes understanding that SLP has limited insight into mechanics of larynx without imaging but tells SLP she desires to initiate therapy regardless.    PATIENT EDUCATION: Education details: see above Person educated: Patient Education method: Theatre stage manager Education comprehension: verbalized understanding  HOME EXERCISE PROGRAM: SOVTE  GOALS: Goals reviewed with patient? Yes  SHORT TERM GOALS: Target date: 12/08/2021  . The patient will independently implement laryngeal hygiene recommendations 6/7 days over 1 week period  Baseline:  12-14-21 Goal status: MET  2.  The patient will perform the introduced head/neck stretches and laryngeal massage to reduce tension which may attribute to discomfort when singing with rare min-A Baseline: 12-07-21 Goal status: PARTIALLY MET, required mod-A following re-instruction to demonstrate circumlaryngeal massage   3.  The patient will perform diaphragmatic (low abdominal) respiration to power sustained vowel (singing) to optimize voicing efficiency and decrease laryngeal hyperfunction in  80% of trials with rare min-A Baseline: 12-07-21 Goal status: NOT MET  4.  The patient will demonstrate all introduced vocal warm up exercises to optimize laryngeal function prior to singingwith rare min-A  Baseline: 12-07-21 Goal status: NOT MET   LONG TERM GOALS: Target date: 12/29/2021  The patient will report daily HEP adherence (BID recommended) over 1 week period with mod-I Baseline: 12-14-21 Goal status: MET  2.  The patient will implement tension reduction techniques with mod-I when sensation of hyperfunction occurs to facilitate return to singing in same practice block with 80% accuracy Baseline:  Goal status: INITIAL  3.  The patient will report carryover of tension reduction techniques and vocal warm up exercises with mod-I over 1 week period Baseline:  Goal status: INITIAL  4.  The patient will perform diaphragmatic (low abdominal) respiration to power singing over 1 verse to optimize voicing efficiency and decrease laryngeal hyperfunction in  80% of trials with rare min-A Baseline:  Goal status: INITIAL  5.  The patient will report subjective perception of improvement in ability to engage in preferred activity via goal attainment scaling  by d/c Baseline:  Goal status: INITIAL   ASSESSMENT:  CLINICAL IMPRESSION: Patient is a 31 y.o. F who was seen today for voice evaluation. PMHx significant for pelvic floor dysfunction, anxiety, and pt reported OCD. Pt with WNL voice during  evaluation, when speaking. Denies vocal fatigue or pain when speaking. Endorses inability to sing at present d/t increased tension when singing. Negatively impacting QoL, as pt enjoys singing and previously used it as a stress reduction technique. Skilled interventions will target using tension reduction techniques and optimizing efficacy of voicing system to enable ability to sing.   OBJECTIVE IMPAIRMENTS: include voice disorder. These impairments are limiting patient from  participating in important activity, reported as essential for QoL and management of stress . Factors affecting potential to achieve goals and functional outcome are co-morbidities and previous level of function.. Patient will benefit from skilled SLP services to address above impairments and improve overall function.  REHAB POTENTIAL: Fair    PLAN: SLP FREQUENCY: 1-2x/week  SLP DURATION: 8 weeks  PLANNED INTERVENTIONS: Cueing hierachy, Internal/external aids, Functional tasks, and SLP instruction and feedback    Su Monks, CCC-SLP 12/23/2021, 2:50 PM

## 2021-12-31 ENCOUNTER — Encounter: Payer: Self-pay | Admitting: Physical Medicine and Rehabilitation

## 2022-01-01 ENCOUNTER — Ambulatory Visit: Payer: 59 | Admitting: Speech Pathology

## 2022-01-01 DIAGNOSIS — R490 Dysphonia: Secondary | ICD-10-CM | POA: Diagnosis not present

## 2022-01-01 NOTE — Therapy (Signed)
OUTPATIENT SPEECH LANGUAGE PATHOLOGY TREATMENT NOTE (DISCHARGE)   Patient Name: Brittany [name] MRN: [id number] DOB:25-Oct-1990, 31 y.o., female Today's Date: 01/01/2022  PCP: Inda Coke, PA REFERRING PROVIDER: Izora Ribas, MD   End of Session - 01/01/22 1237     Visit Number 6    Number of Visits 9    Date for SLP Re-Evaluation 12/30/21    SLP Start Time 1237    SLP Stop Time  1315    SLP Time Calculation (min) 38 min    Activity Tolerance Patient tolerated treatment well               Past Medical History:  Diagnosis Date   Ankle injury    Anxiety    Eating disorder    Frequent headaches    Past Surgical History:  Procedure Laterality Date   ANKLE SURGERY  2018   HERNIA REPAIR     WISDOM TOOTH EXTRACTION     Patient Active Problem List   Diagnosis Date Noted   Positive ANA (antinuclear antibody) 10/19/2019   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   Anorexia nervosa, restricting type, in full remission, moderate 02/16/2018   History of depression 02/16/2018   GAD (generalized anxiety disorder) 12/16/2017   Pelvic floor dysfunction 12/16/2017   Right hip pain 11/02/2017   Grade 2 ankle sprain, right, initial encounter 05/16/2016   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   Right ankle pain 03/03/2015   IBS (irritable bowel syndrome) 05/25/2009    Onset date: referral 10-12-21  REFERRING DIAG: R09.89 (ICD-10-CM) - Throat tightness   THERAPY DIAG:  Dysphonia  Rationale for Evaluation and Treatment Rehabilitation  SUBJECTIVE:   SUBJECTIVE STATEMENT: Pt reports ongoing efforts to complete laryngeal relaxation and voicing exercises with intermittent success.   PAIN:  Are you having pain? 2.5/10 (abdomin, R hip, pelvic floor), slight discomfort in throat and neck    OBJECTIVE:   PATIENT REPORTED OUTCOME MEASURES (PROM): Goal Attainment Scaling: Current: singing very little with limited range  and control Goal: knowledge and reliable tactics which enable ability to sing more often  Exceed the goal: increased range, increased control   TODAY'S TREATMENT:   01-01-22: SLP reviewed all previously taught laryngeal relaxation techniques, warm up and vocal exercises. Pt able to teach back with mod-I. Demonstrates awareness of various areas of tension which subjectively appear to impact vocal quality and comfort when singing. SLP provided modeling and education on shoulder stretches and anchored scalene stretch to add to pt's repertoire if needed. Discussion on experimenting through additive practices to find balance in achieving relaxation required for comfort singing with reduced initial time spent in warm up activities. Pt demonstrates singing of self-generated phrase x6 with rare pitch break (x3) and x1 trial with lower note appearing to have pharyngeal focused resonance. Pt agrees with SLP observation, is able to correct during subsequent production. Pt to be seen by SLP at [facility] center, is agreeable to ST d/c this date. Tells SLP tools and strategies have been beneficial as pt has increased ability and comfort with singing vs baseline of no singing. Does feel like she is making progress towards goal set during goal attainment scaling exercise, as is able to engage in singing more frequently. Feels more work to be done in tools reliably assisting with comfort signing.   12-23-21:Targeted improving vocal quality in singing through Vocal Function Exercises, exercises designed to strengthen and coordinate voicing musculature. SLP provided re-education on purpose of exercises  and demonstrates all four steps: warm up, stretch, contract, sustained phonation. Pt able to demonstrate all x3 with usual feedback to optimize performance. Improved performance vs last session with alteration of target sound. Use of /mo/ with rounded lip posture to incorporate resonant voice and semi occluded vocal tract  principles. Pt with improved pitch glides without evidence of voice breaks present prior session. Pt then able to sing at varying ranges a target phrase, heavy with /m/, without evidence of hoarseness, aphonia, breathiness. Clear vocal productions.   12-14-21: Pt c/o ongoing "throat tightness." SLP re-educated on laryngeal massage and stretches to release tension. Targeted improving vocal quality in singing through Vocal Function Exercises, exercises designed to strengthen and coordinate voicing musculature. SLP educated on purpose of exercises and demonstrates all four steps: warm up, stretch, contract, sustained phonation. Pt able to demonstrate all x3 with usual feedback to optimize performance. Generate key words to faciliate carryover of like-practice at home. Pt to focus on generating strong and steady voicing, as most prominent feature during today's practice and her singing is a sense of holding back and lack of power with voicing. Pt agrees this may be d/t heightened introspection and comparison of what voice used to sound like. Perceptually, pt's voice is free from strain, roughness and hoarseness. Maintaining clear voicing across pitch ranges. Rare pitch breaks during glides (3/8 trials). Sings song she wrote, maintaining clear vocal quality. Update HEP.   12-07-21: SLP provided education on voicing mechanism to include the larynx and it's rest position without excess muscle tension to answer pt's questions upon entering session. Use of biofeedback to ID laryngeal elevation during swallow vs during inhalation. Pt agrees that larynx does not raise with inhalation despite her internal perception it does. Led pt through glide exercise from low to high with sing-speak voice to reveal competence of ability to sing without pain or strain. Reviewed HEP recommendations and updated based on today's interventions. Added two sessions d/t pt missing 2 sessions.   11-16-21: SLP facilitated goal attainment scaling  see above. SLP introduced and demonstrated larngeal massage to aid in decreased tension in laryngeal area. Pt able to teach back via demonstration with min-A. SLP provides direct instruction for semi-occluded vocal tract exercises. Pt able to demonstrate up to pitch siren level with rare min-A. SLP provides encouragement to use low abdominal breath to power vowel through provided instrument.    11-03-21: Education provided on evaluation results and SLP's recommendations. Initiated training re: vocal mechanism and voicing system. Pt verbalizes agreement with POC, all questions answered to satisfaction. SLP educated pt on recommend to persue laryngeal evaluation from ENT. Pt agreeable, will continue to attempt having that appointment scheduled. Verbalizes understanding that SLP has limited insight into mechanics of larynx without imaging but tells SLP she desires to initiate therapy regardless.    PATIENT EDUCATION: Education details: see above Person educated: Patient Education method: Theatre stage manager Education comprehension: verbalized understanding  HOME EXERCISE PROGRAM: SOVTE  GOALS: Goals reviewed with patient? Yes  SHORT TERM GOALS: Target date: 12/08/2021  . The patient will independently implement laryngeal hygiene recommendations 6/7 days over 1 week period  Baseline: 12-14-21 Goal status: MET  2.  The patient will perform the introduced head/neck stretches and laryngeal massage to reduce tension which may attribute to discomfort when singing with rare min-A Baseline: 12-07-21 Goal status: PARTIALLY MET, required mod-A following re-instruction to demonstrate circumlaryngeal massage   3.  The patient will perform diaphragmatic (low abdominal) respiration to power sustained vowel (singing)  to optimize voicing efficiency and decrease laryngeal hyperfunction in  80% of trials with rare min-A Baseline: 12-07-21 Goal status: NOT MET  4.  The patient will demonstrate all  introduced vocal warm up exercises to optimize laryngeal function prior to singingwith rare min-A  Baseline: 12-07-21 Goal status: NOT MET   LONG TERM GOALS: Target date: 12/29/2021  The patient will report daily HEP adherence (BID recommended) over 1 week period with mod-I Baseline: 12-14-21 Goal status: MET  2.  The patient will implement tension reduction techniques with mod-I when sensation of hyperfunction occurs to facilitate return to singing in same practice block with 80% accuracy Baseline: 12-23-21 Goal status: MET  3.  The patient will report carryover of tension reduction techniques and vocal warm up exercises with mod-I over 1 week period Baseline: 01-01-22 Goal status: MET  4.  The patient will perform diaphragmatic (low abdominal) respiration to power singing over 1 verse to optimize voicing efficiency and decrease laryngeal hyperfunction in  80% of trials with rare min-A Baseline: 12-23-21 Goal status: MET  5.  The patient will report subjective perception of improvement in ability to engage in preferred activity via goal attainment scaling by d/c Baseline: 01-01-22 Goal status: PARTIALLY MET   ASSESSMENT:  CLINICAL IMPRESSION: Skilled ST session targeting reduced laryngeal tension to facilitate singing practice. Patient demonstrated reduced laryngeal tension and improved coordination of airflow and resonance during SOVT exercises, use of lip trills with voicing this date. Benefited from lower abdominal breathing during sustained phonation exercises. Able to sing pt generated lyrical line "the music of my mucus and phlegm in my throat" x6, with increasing power and clarity of voice with rare pitch break. Lyrical line marked by range of notes from mid to high to low. Re-education on previously instruction exercises. D/c this date d/t upcoming appointment at [facility] per recommendation from Dr. Constance Holster, ENT.    OBJECTIVE IMPAIRMENTS: include voice disorder.  These impairments are limiting patient from  participating in important activity, reported as essential for QoL and management of stress . Factors affecting potential to achieve goals and functional outcome are co-morbidities and previous level of function.. Patient will benefit from skilled SLP services to address above impairments and improve overall function.  REHAB POTENTIAL: Fair    PLAN: SLP FREQUENCY: 1-2x/week  SLP DURATION: 8 weeks  PLANNED INTERVENTIONS: Cueing hierachy, Internal/external aids, Functional tasks, and SLP instruction and feedback  SPEECH THERAPY DISCHARGE SUMMARY  Visits from Start of Care: 6  Current functional level related to goals / functional outcomes: Increased singing, though pt reporting still without prior range. Still endorses sensation of laryngeal tension. Voice perceptually appears clear, without evidence of hoarseness or strain.    Remaining deficits: Pt reporting dysphonia with singing, endorses ongoing laryngeal tension   Education / Equipment: Semi-occluded vocal tract exercises, flow phonation, resonant voice, warm up exercises, vocal function exercises, HEP, laryngeal massage    Patient agrees to discharge. Patient goals were met. Patient is being discharged due to the patient's request.   Su Monks, [place] 01/01/2022, 1:13 PM

## 2022-01-15 ENCOUNTER — Encounter: Payer: Self-pay | Admitting: Physician Assistant

## 2022-02-03 ENCOUNTER — Encounter: Payer: Self-pay | Admitting: Physician Assistant

## 2022-02-03 ENCOUNTER — Ambulatory Visit: Payer: 59 | Admitting: Physician Assistant

## 2022-02-03 VITALS — BP 118/70 | HR 94 | Temp 97.3°F | Ht 64.0 in | Wt 119.2 lb

## 2022-02-03 DIAGNOSIS — Z79899 Other long term (current) drug therapy: Secondary | ICD-10-CM

## 2022-02-03 NOTE — Progress Notes (Signed)
Brittany [name] is a 32 y.o. female here for a new problem.  History of Present Illness:   Chief Complaint  Patient presents with   Medication Management    Pt needs EKG done.    HPI  Medication management Patient recently started on desipramine by her GI doctor. She was told that she needs an EKG after starting on this medication to make sure there are no significant concerns. She did not obtain an EKG prior to starting medication.  Denies: CP, SOB, dizziness with medication   Past Medical History:  Diagnosis Date   Ankle injury    Anxiety    Eating disorder    Frequent headaches      Social History   Tobacco Use   Smoking status: Never   Smokeless tobacco: Never  Vaping Use   Vaping Use: Never used  Substance Use Topics   Alcohol use: No    Alcohol/week: 0.0 standard drinks of alcohol   Drug use: No    Past Surgical History:  Procedure Laterality Date   ANKLE SURGERY  2018   HERNIA REPAIR     WISDOM TOOTH EXTRACTION      Family History  Problem Relation Age of Onset   Depression Mother    Anxiety disorder Mother    Depression Brother    Anxiety disorder Brother    Diabetes Maternal Grandmother    Hearing loss Maternal Grandmother    Alcohol abuse Maternal Grandfather    Depression Maternal Grandfather    Diabetes Maternal Grandfather    Dementia Paternal Grandfather    Thyroid disease Brother     No Known Allergies  Current Medications:   Current Outpatient Medications:    acetaminophen (TYLENOL) 325 MG tablet, Take by mouth., Disp: , Rfl:    desipramine (NORPRAMIN) 50 MG tablet, Take 50 mg by mouth daily., Disp: , Rfl:    escitalopram (LEXAPRO) 20 MG tablet, Take 1 tablet (20 mg total) by mouth daily., Disp: 90 tablet, Rfl: 0   lisdexamfetamine (VYVANSE) 10 MG capsule, Take by mouth., Disp: , Rfl:    lisdexamfetamine (VYVANSE) 10 MG capsule, Take by mouth., Disp: , Rfl:    magnesium citrate SOLN, Take by mouth., Disp: , Rfl:     Review of Systems:   ROS Negative unless otherwise specified per HPI.  Vitals:   Vitals:   02/03/22 1013  BP: 118/70  Pulse: 94  Temp: (!) 97.3 F (36.3 C)  TempSrc: Temporal  SpO2: 99%  Weight: 119 lb 4 oz (54.1 kg)  Height: 5\' 4"  (1.626 m)     Body mass index is 20.47 kg/m.  Physical Exam:   Physical Exam Vitals and nursing note reviewed.  Constitutional:      General: She is not in acute distress.    Appearance: She is well-developed. She is not ill-appearing or toxic-appearing.  Cardiovascular:     Rate and Rhythm: Normal rate and regular rhythm.     Pulses: Normal pulses.     Heart sounds: Normal heart sounds, S1 normal and S2 normal.  Pulmonary:     Effort: Pulmonary effort is normal.     Breath sounds: Normal breath sounds.  Skin:    General: Skin is warm and dry.  Neurological:     Mental Status: She is alert.     GCS: GCS eye subscore is 4. GCS verbal subscore is 5. GCS motor subscore is 6.  Psychiatric:        Speech: Speech normal.  Behavior: Behavior normal. Behavior is cooperative.     Assessment and Plan:   Medication management EKG tracing is personally reviewed.  EKG notes NSR.  No acute changes.  No red flags on exam or discussion. Continue medication management per specialists.  I,Alexander Ruley,acting as a Education administrator for [employer], PA.,have documented all relevant documentation on the behalf of Inda Coke, PA,as directed by  Inda Coke, PA while in the presence of Inda Coke, [place].  I, Inda Coke, [place], have reviewed all documentation for this visit. The documentation on 02/03/22 for the exam, diagnosis, procedures, and orders are all accurate and complete.  Inda Coke, PA-C

## 2022-03-16 ENCOUNTER — Encounter: Payer: Self-pay | Admitting: Physical Medicine and Rehabilitation

## 2022-03-16 ENCOUNTER — Encounter: Payer: 59 | Attending: Physical Medicine and Rehabilitation | Admitting: Physical Medicine and Rehabilitation

## 2022-03-16 VITALS — BP 107/70 | HR 93 | Ht 64.0 in | Wt 115.0 lb

## 2022-03-16 DIAGNOSIS — R0989 Other specified symptoms and signs involving the circulatory and respiratory systems: Secondary | ICD-10-CM | POA: Diagnosis not present

## 2022-03-16 DIAGNOSIS — M6289 Other specified disorders of muscle: Secondary | ICD-10-CM | POA: Diagnosis not present

## 2022-03-16 NOTE — Progress Notes (Addendum)
Subjective:    Patient ID: Brittany [name], female    DOB: [birth year], 32 y.o.   MRN: [id number]  HPI  Brittany [name] is a 32 year old woman who presents for f/u of chronic pelvic pain.  1) Pelvic floor dysfunction: -they second neuromodulation attempt did not help and was very painful. She now has scars on her buttocks -pain has been stable since last visit -saw the pelvic pain specialist and she received a lot of information -he did not have time to look into ECSWT much -her mental health is currently helping her the most.  -she is concerned that bowel retraining will not work. This is supposed to be when she only allows herself to be in the bathroom for a certain amount of time and number of times during the day.  -when she can get both her smooth and skeletal muscles to relax for her to go, it only lasts so long and she still has incomplete evacuation.  -she has good days and bad days.  -she is able to appreciate the good days -she asks how to get the medical records of her MRI to share with her therapist -she will be trying biofeedback today -she is trying dry needling in the pelvic flood and is seeing a pelvic floor therapist to do rectal biofeedback and trying rectal dilators. She has progressed in that she can use bigger ones.  -She has done PT which she feels is a temporary band aid.  -Average pain is 7/10 -She feels that some, but not all of the pain is from anxiety. 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  -pain has been more bearable -she is trying to find a lot of mental health help -she is continuing with therapy -pain is still very bad often and extremely life-limiting.  -she has benefited from working with chiropractor -she is very limited in what she can physically do -sh has tried valium suppositories -she uses a splint and release tool and it helps to keep the  prolapse in place to that she can have a more normal BM.   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  -the ENT has been helping with her throat tightness  3) Lower back pain -improved with chiropractor  4) Anxiety -is trying valium suppositories  5) Visual disturbances: -worse with stress -discussed with her father who has floaters and she has a family history of migraines  6) Constipation -still severe without improvement  Pain Inventory Average Pain 4 Pain Right Now 4 My pain is intermittent, sharp, dull, stabbing, tingling, and aching  In the last 24 hours, has pain interfered with the following? General activity 8 Relation with others 9 Enjoyment of life 9 What TIME of day is your pain at its worst? morning , daytime, and evening Sleep (in general) Fair  Pain is worse with: inactivity, unsure, and some activites Pain improves with: therapy/exercise Relief from Meds:1        Family History  Problem Relation Age of Onset   Depression Mother    Anxiety disorder Mother    Depression Brother  Anxiety disorder Brother    Diabetes Maternal Grandmother    Hearing loss Maternal Grandmother    Alcohol abuse Maternal Grandfather    Depression Maternal Grandfather    Diabetes Maternal Grandfather    Dementia Paternal Grandfather    Thyroid disease Brother    Social History   Socioeconomic History   Marital status: Married    Spouse name: Not on file   Number of children: Not on file   Years of education: Not on file   Highest education level: Not on file  Occupational History   Not on file  Tobacco Use   Smoking status: Never   Smokeless tobacco: Never  Vaping Use   Vaping Use: Never used  Substance and  Sexual Activity   Alcohol use: No    Alcohol/week: 0.0 standard drinks of alcohol   Drug use: No   Sexual activity: Yes    Birth control/protection: None  Other Topics Concern   Not on file  Social History Narrative   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  Opioid Risk Score:   Fall Risk Score:  `1  Depression screen [facility] 2/9     02/03/2022   10:31 AM 12/15/2021    2:13 PM 10/12/2021   11:38 AM 06/09/2021   10:59 AM 10/24/2020    2:48 PM 08/26/2020    2:03 PM 07/22/2020    2:18 PM  Depression screen PHQ 2/9  Decreased Interest 1 1 0 0 '1 1 1  '$ Down, Depressed, Hopeless 1 1 0 0 '2 1 1  '$ PHQ - 2 Score 2 2 0 0 '3 2 2  '$ Altered sleeping 1    1    Tired, decreased energy 1    1    Change in appetite 0    0    Feeling bad or failure about yourself  3    3    Trouble concentrating 3    3    Moving slowly or fidgety/restless 1    1    Suicidal thoughts 0    0    PHQ-9 Score 11    12    Difficult doing work/chores Very difficult    Very difficult     Review of Systems  Constitutional: Negative.        Night sweats  HENT: Negative.    Eyes: Negative.   Respiratory: Negative.    Cardiovascular: Negative.   Gastrointestinal:  Positive for abdominal pain, constipation and diarrhea.  Endocrine: Negative.   Musculoskeletal:  Positive for arthralgias, back pain and neck pain.       Stomach and pelvic area  Skin: Negative.   Allergic/Immunologic: Negative.   Psychiatric/Behavioral:   The patient is nervous/anxious.   All other systems reviewed and are negative.      Objective:   Physical Exam Gen: no distress, normal appearing, BMI 20.08,  weight 117 lbs, 108/69 HEENT: oral mucosa pink and moist, NCAT Cardio: Reg rate Chest: normal effort, normal rate of breathing Abd: soft, non-distended Ext: no edema Psych: pleasant, normal affect, discusses her anxieties regarding her pain and bowel regimen, OCD tendencies Skin: intact, red areas on chest Neuro: Alert and oriented x3  Assessment & Plan:  Mrs. Boblitt is a 32 year old woman who presents for follow-up of pelvic floor dysfunction.  1) Pelvic floor dysfunction: -Discussed Zynex Nexwave and heating/cooling blanket  -discussed her negative response to enema.  -discussed her follow-up with the pelvic pain specialist -discussed the plan with trigger point injections with steroids -discussed the plan for Botox -she takes magnesium citrate and even then she cannot have complete evacuation. -encouraged rectal biofeedback today -discussed therapy is not helping enough -discussed her pelvic floor therapy -discussed extracorporeal shockwave therapy -discussed that MRI results are normal, back pain is likely ligamentous/muscular -discussed that she can request MRI records by calling medical records -continue pelvic floor PT, rectal biofeedback, progressive rectal dilation.  -once in a while she has bowel incontinence -She has pelvic floor pain and has had great benefit from pelvic floor therapists.  -discussed pudendal nerve entrapment, prolotherapy. Provided link for further education.  -discussed TCA. -discussed Botox as an option, discussed that I would recommend trying this.  -continue magnesium citrate.  -this has a huge impact on her quality of life.  -discussed anti-spasticity medications and Botox. Extensively discussed the benefits of Botox.  -Provided with a pain relief journal and discussed that it contains  foods and lifestyle tips to naturally help to improve pain. Discussed that these lifestyle strategies are also very good for health unlike some medications which can have negative side effects. Discussed that the act of keeping a journal can be therapeutic and helpful to realize patterns what helps to trigger and alleviate pain.   -continue internal massaging -Discussed current symptoms of pain and history of pain.  -Discussed benefits of exercise in reducing pain. -Discussed following foods that may reduce pain: 1) Ginger (especially studied for arthritis)- reduce leukotriene production to decrease inflammation 2) Blueberries- high in phytonutrients that decrease inflammation 3) Salmon- marine omega-3s reduce joint swelling and pain 4) Pumpkin seeds- reduce inflammation 5) dark chocolate- reduces inflammation 6) turmeric- reduces inflammation 7) tart cherries - reduce pain and stiffness 8) extra virgin olive oil - its compound olecanthal helps to block prostaglandins  9) chili peppers- can be eaten or applied topically via capsaicin 10) mint- helpful for headache, muscle aches, joint pain, and itching 11) garlic- reduces inflammation  Link to further information on diet for chronic pain: [email or web address]   2) Anxiety: -continue Lexapro -she is seeing a mental health therapist.  -continue valium suppositories -Discussed exercise and meditation as tools to decrease anxiety. -Recommended Down Dog Yoga app -Discussed spending time outdoors. -Discussed positive re-framing of anxiety.  -Discussed the following foods that have been show to reduce anxiety: 1) [place] nuts, mushrooms, soy beans due to their high selenium content. Upper limit of toxicity of selenium is 459mg/day so no more than 3-4 bBolivianuts per day.  2) Fatty fish such as salmon, mackerel, sardines, trout, and herring- high in omega-3 fatty acids 3)  Eggs- increases serotonin and dopamine 4) Pumpkin seeds- high in omega-3 fatty acids 5) dark chocolate- high in flavanols that increase blood flow to brain 6) turmeric- take with black pepper to increase absorption 7) chamomile tea- antioxidant and anti-inflammatory properties 8) yogurt without sugar- supports gut-brain axis 9) green  tea- contains L- theanine 10) blueberries- high in vitamin C and antioxidants 11) [place]- high in tryptophan which gets converted to serotonin 12) bell peppers- rich in vitamin C and antioxidants 13) citrus fruits- rich in vitamin C and antioxidants 14) almonds- high in vitamin E and healthy fats 15) chia seeds- high in omega-3 fatty acids  3) Sweating profusely at times: -could be autonomic dysfunction.   4) Hypermobility: -discussed with her that I will call as soon as EDS result returns.  -discussed current symptoms -discussed her following with an EDS specialist.   5) Constipation: -continue mag citrate as needed, discussed its health benefits.   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   7) Visual disturbances  8) Low back pain, chronic, without sciatica.  -discussed bracing  9) Throat tightness -referred to SLP and ENT -discussed looking for a vocal rehabilitation expert -encouraged follow-up with ENT since this is helping

## 2022-04-29 ENCOUNTER — Encounter: Payer: Self-pay | Admitting: Physician Assistant

## 2022-05-03 ENCOUNTER — Other Ambulatory Visit: Payer: Self-pay | Admitting: Physician Assistant

## 2022-05-03 DIAGNOSIS — M357 Hypermobility syndrome: Secondary | ICD-10-CM

## 2022-05-13 ENCOUNTER — Ambulatory Visit: Payer: 59 | Admitting: Family Medicine

## 2022-05-13 ENCOUNTER — Encounter: Payer: Self-pay | Admitting: Family Medicine

## 2022-05-13 VITALS — BP 110/72 | HR 103 | Ht 64.0 in | Wt 111.4 lb

## 2022-05-13 DIAGNOSIS — G894 Chronic pain syndrome: Secondary | ICD-10-CM

## 2022-05-13 DIAGNOSIS — M357 Hypermobility syndrome: Secondary | ICD-10-CM

## 2022-05-13 NOTE — Progress Notes (Signed)
   I, Stevenson Clinch, CMA acting as a scribe for Clementeen Graham, MD.  Brittany [name] is a 32 y.o. female who presents to [facility] today for issues related to her hypermobility syndrome. Pt was last seen by Dr. Denyse Amass on 03/23/21.  Today, pt reports pelvic floor dysfunction, pelvic pain, hip flexor tightness, contorsion of the spine, pain in the neck/shoulders, and jaw pain. Feels that that's are popping out of place. Has been seeing chiro, sx worsen the long without seeing Chiro.  She has questions about various over-the-counter compressive devices or splints or braces that could help control her joint laxity.  She has an upcoming appointment with Karie Mainland PT next week.  Dx imaging: 06/18/21 L-spine MRI   Pertinent review of systems: No fevers or chills.  Positive for GI symptoms.  Relevant historical information: Pelvic floor dysfunction.   Exam:  BP 110/72   Pulse (!) 103   Ht 5\' 4"  (1.626 m)   Wt 111 lb 6.4 oz (50.5 kg)   SpO2 97%   BMI 19.12 kg/m  General: Well Developed, well nourished, and in no acute distress.   MSK: Normal gait.  Joint laxity multiple joints.      Assessment and Plan: 32 y.o. female with chronic multiple location intermittent pains attributable to fundamental joint laxity with her hypermobile syndrome complicated by pelvic floor dysfunction.  Fundamentally I do not think she can adequately provide stability to her hips and her core without impacting her pelvic floor dysfunction. I am optimistic that physical therapy could be helpful. Recommended a few different bracing or compression devices.  I think a percussive massage or Theragran type device could be helpful for her.  She has tried multiple different medications so I do not think there is much benefit to doing further medication management. Follow-up with PT and check back with me as needed.  I am already reaching out to physical therapy so we can talk  about her progress proactively.  Total encounter time 30 minutes including face-to-face time with the patient and, reviewing past medical record, and charting on the date of service.     Discussed warning signs or symptoms. Please see discharge instructions. Patient expresses understanding.   The above documentation has been reviewed and is accurate and complete Clementeen Graham, M.D.

## 2022-05-13 NOTE — Patient Instructions (Addendum)
Thank you for coming in today.   [email or web address]  Soft cervical brace  Pelvis strap  Thumb loop wrist strap.   Heat and TENS

## 2022-05-18 ENCOUNTER — Ambulatory Visit: Payer: 59 | Attending: Physician Assistant | Admitting: Physical Therapy

## 2022-05-18 ENCOUNTER — Encounter: Payer: Self-pay | Admitting: Physical Therapy

## 2022-05-18 ENCOUNTER — Encounter: Payer: 59 | Admitting: Physical Medicine and Rehabilitation

## 2022-05-18 DIAGNOSIS — R252 Cramp and spasm: Secondary | ICD-10-CM | POA: Insufficient documentation

## 2022-05-18 DIAGNOSIS — M357 Hypermobility syndrome: Secondary | ICD-10-CM | POA: Insufficient documentation

## 2022-05-18 NOTE — Therapy (Signed)
OUTPATIENT PHYSICAL THERAPY THORACOLUMBAR EVALUATION   Patient Name: Brittany [name] MRN: [id number] DOB:May 16, 1990, 32 y.o., female Today's Date: 05/18/2022  END OF SESSION:  PT End of Session - 05/18/22 0944     Visit Number 1    Number of Visits 8    Date for PT Re-Evaluation 07/13/22    Authorization Type [employer]    PT Start Time 0940    PT Stop Time 1015    PT Time Calculation (min) 35 min    Activity Tolerance Patient tolerated treatment well    Behavior During Therapy [facility] for tasks assessed/performed             Past Medical History:  Diagnosis Date   Ankle injury    Anxiety    Eating disorder    Frequent headaches    Past Surgical History:  Procedure Laterality Date   ANKLE SURGERY  2018   HERNIA REPAIR     WISDOM TOOTH EXTRACTION     Patient Active Problem List   Diagnosis Date Noted   Positive ANA (antinuclear antibody) 10/19/2019   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   Anorexia nervosa, restricting type, in full remission, moderate 02/16/2018   History of depression 02/16/2018   GAD (generalized anxiety disorder) 12/16/2017   Pelvic floor dysfunction 12/16/2017   Right hip pain 11/02/2017   Grade 2 ankle sprain, right, initial encounter 05/16/2016   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   Right ankle pain 03/03/2015   IBS (irritable bowel syndrome) 05/25/2009    PCP: Jarold Motto PA   REFERRING PROVIDER: Jarold Motto PA   REFERRING DIAG: M35.7 (ICD-10-CM) - Benign hypermobility syndrome  Rationale for Evaluation and Treatment: Rehabilitation  THERAPY DIAG:  Hypermobility syndrome  Cramp and spasm  ONSET DATE: chronic 2013-2018, runner, ankle sprains   SUBJECTIVE:                                                                                                                                                                                           SUBJECTIVE STATEMENT:  Brittany [name] is  referred to physical therapy for hypermobility syndrome which she has likely had for several years.  Currently the pain she is experiencing is in her pelvis, jaw, shoulder as well as her spine.  She does not know if she has the genetic Ehlers-Danlos diagnosis.  She has always been a very active person.  She recalls ankle injury in 2018 in which she had PT for.  The PT also addressed her hip. She unable to effectively run due to limp and hip pain .   She is not sure  if she dislocated or just subluxed her joint.  She often has difficulty with her neck where she feels that she needs chiropractic adjustments.  She feels tightness throughout her body but also knows that her ligaments are loose.  Her right hip is particularly sore and tight anteriorly.  She was recently discharged from physical therapy for pelvic floor dysfunction and that is probably the most apparent complaint right now. She has incomplete emptying and that causes extreme abdominal pain .  She continues to do the pelvic floor exercises.  Due to frequent trips to the bathroom she does not get proper sleep.  With the extensive time she has been in a squatted or leaning forward position she has increased neck pain. She reports it is hard to work, working part time at a [employer], [employer].  She was functional and able to do this job until 2018.  She has good coworkers support.  She has a good understanding of this condition but I do not know that she has a peer to discuss things with in person.  She does not think she has any symptoms of dysautonomia.  She complains of gross fatigue but does state that she has always had an energetic personality.     PERTINENT HISTORY:  chronic pain, pelvic floor dysfunction   Ankle surgery Rt  L ankle sprain  Anxiety  PAIN:  Are you having pain? Yes: NPRS scale: 4/10 Pain location: neck and upper back Pain description: Tight , sharp Aggravating factors: not sure, can be random or  with activity  Relieving factors: TENS unit, heat   Yes: NPRS scale: NT/10 Pain location: NT Pain description: NT Aggravating factors: NT Relieving factors: NT  PRECAUTIONS: Other: joint instability   WEIGHT BEARING RESTRICTIONS: No  FALLS:  Has patient fallen in last 6 months? No  LIVING ENVIRONMENT: Lives with: lives with their spouse Lives in: House/apartment Stairs:  NT Has following equipment at home:  several different braces: neck, SI, ankle and wrist   OCCUPATION: Part time [employer].  PLOF: Independent  PATIENT GOALS: Figure out how to release tension and stabilize  and then have a home program.  Reduce pain see how much I can do.  Manage these symptoms.   NEXT MD VISIT: unknown   OBJECTIVE:   Assessment (as on reg. Template) Beighton Scale Lumbar (_1/1) Knees (1_/2) Elbows (2_/2)  5th digit (_2/2) Thumb (_1/2)  7/9 Beighton  Comment on hips, shoulders see below     DIAGNOSTIC FINDINGS:  IMPRESSION: No significant spinal canal or neural foraminal stenosis.  PATIENT SURVEYS:  FOTO NT   SCREENING FOR RED FLAGS: Bowel or bladder incontinence: No Spinal tumors: No Cauda equina syndrome: No Compression fracture: No Abdominal aneurysm: No  COGNITION: Overall cognitive status: Within functional limits for tasks assessed     SENSATION: WFL  MUSCLE LENGTH: Hamstrings: 70-75 passive SLR  Thomas test: NT   POSTURE: rounded shoulders, forward head, and left pelvic obliquity  PALPATION: Gross tenderness to palpation to cervical thoracic and lumbar spine.  No areas of particular hypertonicity.  Skin is not particularly stretchy or velvety.  LUMBAR ROM:   AROM eval  Flexion Palms to floor   Extension WNL min pain   Right lateral flexion WNL  Left lateral flexion WNL  Right rotation WNL  Left rotation WNL    (Blank rows = not tested)  LOWER EXTREMITY ROM:     Passive/(Active) Right eval Left eval  Hip flexion WNL, pinching  WNL  Hip  extension    Hip abduction    Hip adduction    Hip internal rotation WNL not excessive  WNL, slightly more than Rt.   Hip external rotation WNL not excessive  WNL   Knee flexion WNL WNL  Knee extension WNL Hyper   Ankle dorsiflexion    Ankle plantarflexion    Ankle inversion    Ankle eversion     (Blank rows = not tested)  LOWER EXTREMITY MMT:    MMT Right eval Left eval  Hip flexion 4+ pain  4+  Hip extension    Hip abduction    Hip adduction    Hip internal rotation 4/5 pain  4+/5  Hip external rotation 4/5 pain  4+/5  Knee flexion 5/5 5/5  Knee extension 5/5 5/5  Ankle dorsiflexion    Ankle plantarflexion    Ankle inversion    Ankle eversion     (Blank rows = not tested)  LUMBAR SPECIAL TESTS:  Straight leg raise test: Negative and Single leg stance test: Negative No increased pain with air squats in lumbar spine Deeply squatting to the floor does increase neck pain which increases with time and straining FUNCTIONAL TESTS:  NT on eval   GAIT: Distance walked: 150+ Assistive device utilized: None Level of assistance: Complete Independence Comments: NT   TODAY'S TREATMENT:                                                                                                                              DATE: 05/18/22  PT eval, Self care   PATIENT EDUCATION:  Education details: PT, POC, core strength and stability , SI belt and soft collar OK when needed  Person educated: Patient Education method: Medical illustrator Education comprehension: verbalized understanding  HOME EXERCISE PROGRAM: NT on eval   ASSESSMENT:  CLINICAL IMPRESSION: Patient is a 32 y.o. female  who was seen today for physical therapy evaluation and treatment for hypermobility syndrome.   OBJECTIVE IMPAIRMENTS: decreased activity tolerance, decreased strength, increased fascial restrictions, impaired flexibility, impaired UE functional use, improper body mechanics, postural  dysfunction, and pain.   ACTIVITY LIMITATIONS: carrying, lifting, bending, sitting, standing, sleeping, and locomotion level  PARTICIPATION LIMITATIONS: laundry, interpersonal relationship, driving, shopping, community activity, and occupation  PERSONAL FACTORS: Past/current experiences and 3+ comorbidities: Anxiety, chronic pain, pelvic floor dysfunction  are also affecting patient's functional outcome.   REHAB POTENTIAL: Good  CLINICAL DECISION MAKING: Unstable/unpredictable  EVALUATION COMPLEXITY: High   GOALS: Goals reviewed with patient? Yes  SHORT TERM GOALS: Target date: 06/15/2022    Patient will be independent with basic home exercise program for core stability Baseline: Goal status: INITIAL  2.  Pt will be able to tolerate core routine in the clinic without increased pain Baseline:  Goal status: INITIAL  3.  Pt will be able to report Rt hip pinching/tightness lessened to occasional and min with walking/activity.  Baseline:  Goal status: INITIAL  LONG TERM GOALS: Target date: 07/13/2022    Pt will be able to show I with HEP in order to progress to more challenging resistance training.  Baseline:  Goal status: INITIAL  2.  Pt will be able to report no more than min increase in neck pain when toileting Baseline:  Goal status: INITIAL  3.  Pt will report improved rest at night due to less neck pain, rare need to wear neck brace at night.  Baseline:  Goal status: INITIAL  4.  Pt will be able to report no back strain with supine core work with maintained pelvic neutral Baseline:  Goal status: INITIAL  PLAN:  PT FREQUENCY: 1x/week  PT DURATION: 8 weeks  PLANNED INTERVENTIONS: Therapeutic exercises, Therapeutic activity, Neuromuscular re-education, Balance training, Patient/Family education, Self Care, Joint mobilization, Dry Needling, Electrical stimulation, Taping, Manual therapy, and Re-evaluation.  PLAN FOR NEXT SESSION: Develop home exercise program.   Core stability using soft Pilates ball, props.  Consider Pilates.  Check benefit of her SI belt   Adeana Grilliot, PT 05/18/2022, 6:46 PM   Karie Mainland, PT 05/18/22 7:23 PM Phone: [phone] Fax: [phone]

## 2022-05-23 NOTE — Therapy (Unsigned)
OUTPATIENT PHYSICAL THERAPY TREATMENT NOTE   Patient Name: Brittany [name] MRN: [id number] DOB:13-Feb-1990, 32 y.o., female Today's Date: 05/24/2022  PCP: Jarold Motto PA REFERRING PROVIDER: Dr. Clementeen Graham  END OF SESSION:   PT End of Session - 05/24/22 1107     Visit Number 2    Number of Visits 8    Date for PT Re-Evaluation 07/13/22    Authorization Type [employer]    PT Start Time 1105    PT Stop Time 1155    PT Time Calculation (min) 50 min    Activity Tolerance Patient tolerated treatment well    Behavior During Therapy WFL for tasks assessed/performed             Past Medical History:  Diagnosis Date   Ankle injury    Anxiety    Eating disorder    Frequent headaches    Past Surgical History:  Procedure Laterality Date   ANKLE SURGERY  2018   HERNIA REPAIR     WISDOM TOOTH EXTRACTION     Patient Active Problem List   Diagnosis Date Noted   Positive ANA (antinuclear antibody) 10/19/2019   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   Anorexia nervosa, restricting type, in full remission, moderate 02/16/2018   History of depression 02/16/2018   GAD (generalized anxiety disorder) 12/16/2017   Pelvic floor dysfunction 12/16/2017   Right hip pain 11/02/2017   Grade 2 ankle sprain, right, initial encounter 05/16/2016   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   Right ankle pain 03/03/2015   IBS (irritable bowel syndrome) 05/25/2009    REFERRING DIAG: M35.7 (ICD-10-CM) - Benign hypermobility syndrome  THERAPY DIAG:  Hypermobility syndrome  Cramp and spasm  Rationale for Evaluation and Treatment Rehabilitation  PERTINENT HISTORY: see above  EDS, HSD  PRECAUTIONS: monitor lightheadedness, joint stability, pelvic pain   SUBJECTIVE:                                                                                                                                                                                       SUBJECTIVE STATEMENT:  My neck feels better I have been using the neck brace when I squat and when I sleep.    PAIN:  Are you having pain? Yes: NPRS scale: 2/10 Pain location: neck and shoulders  Pain description: tight Aggravating factors: squatting, sleeping, malposition  Relieving factors: neck brace, the right exercises    OBJECTIVE: (objective measures completed at initial evaluation unless otherwise dated)  Assessment (as on reg. Template) Beighton Scale Lumbar (_1/1) Knees (1_/2) Elbows (2_/2)  5th digit (_2/2) Thumb (_1/2)   7/9 Beighton  Comment on  hips, shoulders see below      DIAGNOSTIC FINDINGS:  IMPRESSION: No significant spinal canal or neural foraminal stenosis.   PATIENT SURVEYS:  FOTO NT    SCREENING FOR RED FLAGS: Bowel or bladder incontinence: No Spinal tumors: No Cauda equina syndrome: No Compression fracture: No Abdominal aneurysm: No   COGNITION: Overall cognitive status: Within functional limits for tasks assessed                          SENSATION: WFL   MUSCLE LENGTH: Hamstrings: 70-75 passive SLR  Thomas test: NT    POSTURE: rounded shoulders, forward head, and left pelvic obliquity   PALPATION: Gross tenderness to palpation to cervical thoracic and lumbar spine.  No areas of particular hypertonicity.  Skin is not particularly stretchy or velvety.   LUMBAR ROM:    AROM eval  Flexion Palms to floor   Extension WNL min pain   Right lateral flexion WNL  Left lateral flexion WNL  Right rotation WNL  Left rotation WNL    (Blank rows = not tested)   LOWER EXTREMITY ROM:      Passive/(Active) Right eval Left eval  Hip flexion WNL, pinching  WNL  Hip extension      Hip abduction      Hip adduction      Hip internal rotation WNL not excessive  WNL, slightly more than Rt.   Hip external rotation WNL not excessive  WNL   Knee flexion WNL WNL  Knee extension WNL Hyper   Ankle dorsiflexion      Ankle plantarflexion       Ankle inversion      Ankle eversion       (Blank rows = not tested)   LOWER EXTREMITY MMT:     MMT Right eval Left eval  Hip flexion 4+ pain  4+  Hip extension      Hip abduction      Hip adduction      Hip internal rotation 4/5 pain  4+/5  Hip external rotation 4/5 pain  4+/5  Knee flexion 5/5 5/5  Knee extension 5/5 5/5  Ankle dorsiflexion      Ankle plantarflexion      Ankle inversion      Ankle eversion       (Blank rows = not tested)   LUMBAR SPECIAL TESTS:  Straight leg raise test: Negative and Single leg stance test: Negative No increased pain with air squats in lumbar spine Deeply squatting to the floor does increase neck pain which increases with time and straining FUNCTIONAL TESTS:  NT on eval    GAIT: Distance walked: 150+ Assistive device utilized: None Level of assistance: Complete Independence Comments: NT    TODAY'S TREATMENT:  [facility] Adult PT Treatment:                                                DATE: 05/24/22 Therapeutic Exercise: Cervical rotation  Ball under head/neck cervical retraction x 10  Looped Red band overhead lift and chest press x 10 each  Horizontal bands red, green and blue x 10  Neutral spine, A/P tilting with breathing coordination  Tr A x 10 with  and without ball under hips  Dead bug UE x 10 then LE x 10, ball under pelvic 90/90 hold with breathing x 30 sec x 3  90/90 toe taps x 10   Self Care: Muscles of core, Tr A vs Rectus Ab contraction, breathing and principles, hip flexor  05/18/22  PT eval, Self care    PATIENT EDUCATION:  Education details: PT, POC, core strength and stability , SI belt and soft collar OK when needed  Person educated: Patient Education method: Explanation and Demonstration Education comprehension: verbalized understanding   HOME EXERCISE PROGRAM: Access Code:  [email or web address] URL: [email or web address] Date: 05/24/2022 Prepared by: Karie Mainland  Exercises - Seated Chin Tuck with Neck Elongation  - 2 x daily - 7 x weekly - 2 sets - 10 reps - 5 hold - Standing Shoulder Horizontal Abduction with Resistance  - 2 x daily - 7 x weekly - 2 sets - 10 reps - 5 hold - Hooklying Transversus Abdominis Palpation  - 2 x daily - 7 x weekly - 2 sets - 10 reps - 5 hold - Supine 90/90 Abdominal Bracing  - 2 x daily - 7 x weekly - 1 sets - 3-5 reps - 10-15  hold - Supine 90/90 Alternating Heel Touches with Posterior Pelvic Tilt  - 2 x daily - 7 x weekly - 2 sets - 10 reps - 5 hold   ASSESSMENT:   CLINICAL IMPRESSION:  Pt was given deep core stability exercises, breathing to address both cervical and lumbar spine.  She tends to have Rt anterior hip pain  with SLR and supine hip flex/ext.  Cues to increase a posterior tilt if needed.  Pt needed increased cues for exercises for coordination/breathing but she did well overall due to her good working knowledge of core and anatomy. Given email address to follow up if needed before her appt time.   OBJECTIVE IMPAIRMENTS: decreased activity tolerance, decreased strength, increased fascial restrictions, impaired flexibility, impaired UE functional use, improper body mechanics, postural dysfunction, and pain.    ACTIVITY LIMITATIONS: carrying, lifting, bending, sitting, standing, sleeping, and locomotion level   PARTICIPATION LIMITATIONS: laundry, interpersonal relationship, driving, shopping, community activity, and occupation   PERSONAL FACTORS: Past/current experiences and 3+ comorbidities: Anxiety, chronic pain, pelvic floor dysfunction  are also affecting patient's functional outcome.    REHAB POTENTIAL: Good   CLINICAL DECISION MAKING: Unstable/unpredictable   EVALUATION COMPLEXITY: High     GOALS: Goals reviewed with patient? Yes   SHORT TERM GOALS: Target date: 06/15/2022     Patient will be  independent with basic home exercise program for core stability Baseline: Goal status: INITIAL   2.  Pt will be able to tolerate core routine in the clinic without increased pain Baseline:  Goal status: INITIAL   3.  Pt will be able to report Rt hip pinching/tightness lessened to occasional and min with walking/activity.  Baseline:  Goal status: INITIAL     LONG TERM GOALS: Target date: 07/13/2022     Pt will be able to show I with HEP in order to progress to more challenging resistance training.  Baseline:  Goal status: INITIAL   2.  Pt will be able to report no more than min increase in neck pain when toileting Baseline:  Goal status: INITIAL   3.  Pt will report improved rest at night due to less neck pain, rare need to wear neck brace at night.  Baseline:  Goal status: INITIAL   4.  Pt will be able to report no back strain with supine core work with maintained pelvic neutral Baseline:  Goal status: INITIAL   PLAN:   PT FREQUENCY: 1x/week   PT DURATION: 8 weeks   PLANNED INTERVENTIONS: Therapeutic exercises, Therapeutic activity, Neuromuscular re-education, Balance training, Patient/Family education, Self Care, Joint mobilization, Dry Needling, Electrical stimulation, Taping, Manual therapy, and Re-evaluation.   PLAN FOR NEXT SESSION: check/advance home exercise program.  Core stability using soft Pilates ball, props.  Consider Pilates.  Check benefit of her SI belt   Karie Mainland, PT 05/24/22 12:05 PM Phone: [phone] Fax: [phone]   Theresea Trautmann, PT 05/24/2022, 12:01 PM

## 2022-05-24 ENCOUNTER — Encounter: Payer: Self-pay | Admitting: Physical Therapy

## 2022-05-24 ENCOUNTER — Ambulatory Visit: Payer: 59 | Admitting: Physical Therapy

## 2022-05-24 DIAGNOSIS — R252 Cramp and spasm: Secondary | ICD-10-CM

## 2022-05-24 DIAGNOSIS — M357 Hypermobility syndrome: Secondary | ICD-10-CM

## 2022-06-09 NOTE — Therapy (Unsigned)
OUTPATIENT PHYSICAL THERAPY TREATMENT NOTE   Patient Name: Brittany [name] MRN: [id number] DOB:Jul 19, 1990, 32 y.o., female Today's Date: 06/10/2022  PCP: Jarold Motto PA REFERRING PROVIDER: Dr. Clementeen Graham  END OF SESSION:   PT End of Session - 06/10/22 1159     Visit Number 3    Number of Visits 8    Date for PT Re-Evaluation 07/13/22    Authorization Type [employer]    PT Start Time 1155    PT Stop Time 1235    PT Time Calculation (min) 40 min    Activity Tolerance Patient tolerated treatment well    Behavior During Therapy WFL for tasks assessed/performed              Past Medical History:  Diagnosis Date   Ankle injury    Anxiety    Eating disorder    Frequent headaches    Past Surgical History:  Procedure Laterality Date   ANKLE SURGERY  2018   HERNIA REPAIR     WISDOM TOOTH EXTRACTION     Patient Active Problem List   Diagnosis Date Noted   Positive ANA (antinuclear antibody) 10/19/2019   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   Anorexia nervosa, restricting type, in full remission, moderate 02/16/2018   History of depression 02/16/2018   GAD (generalized anxiety disorder) 12/16/2017   Pelvic floor dysfunction 12/16/2017   Right hip pain 11/02/2017   Grade 2 ankle sprain, right, initial encounter 05/16/2016   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   Right ankle pain 03/03/2015   IBS (irritable bowel syndrome) 05/25/2009    REFERRING DIAG: M35.7 (ICD-10-CM) - Benign hypermobility syndrome  THERAPY DIAG:  Hypermobility syndrome  Cramp and spasm  Rationale for Evaluation and Treatment Rehabilitation  PERTINENT HISTORY: see above  EDS, HSD  PRECAUTIONS: monitor lightheadedness, joint stability, pelvic pain   SUBJECTIVE:                                                                                                                                                                                       SUBJECTIVE STATEMENT:  Pt arrived late.  I have figured some things out about the exercises.  I bought the ball. I thin my upper traps and neck take over. Pt having bloating and pelvis pain today.    PAIN:  Are you having pain? Yes: NPRS scale: 2/10 Pain location: neck and shoulders  Pain description: tight Aggravating factors: squatting, sleeping, malposition  Relieving factors: neck brace, the right exercises    OBJECTIVE: (objective measures completed at initial evaluation unless otherwise dated)  Assessment (as on reg. Template) Beighton Scale Lumbar (_1/1)  Knees (1_/2) Elbows (2_/2)  5th digit (_2/2) Thumb (_1/2)   7/9 Beighton  Comment on hips, shoulders see below      DIAGNOSTIC FINDINGS:  IMPRESSION: No significant spinal canal or neural foraminal stenosis.   PATIENT SURVEYS:  FOTO NT    SCREENING FOR RED FLAGS: Bowel or bladder incontinence: No Spinal tumors: No Cauda equina syndrome: No Compression fracture: No Abdominal aneurysm: No   COGNITION: Overall cognitive status: Within functional limits for tasks assessed                          SENSATION: WFL   MUSCLE LENGTH: Hamstrings: 70-75 passive SLR  Thomas test: NT    POSTURE: rounded shoulders, forward head, and left pelvic obliquity   PALPATION: Gross tenderness to palpation to cervical thoracic and lumbar spine.  No areas of particular hypertonicity.  Skin is not particularly stretchy or velvety.   LUMBAR ROM:    AROM eval  Flexion Palms to floor   Extension WNL min pain   Right lateral flexion WNL  Left lateral flexion WNL  Right rotation WNL  Left rotation WNL    (Blank rows = not tested)   LOWER EXTREMITY ROM:      Passive/(Active) Right eval Left eval  Hip flexion WNL, pinching  WNL  Hip extension      Hip abduction      Hip adduction      Hip internal rotation WNL not excessive  WNL, slightly more than Rt.   Hip external rotation WNL not excessive  WNL   Knee flexion  WNL WNL  Knee extension WNL Hyper   Ankle dorsiflexion      Ankle plantarflexion      Ankle inversion      Ankle eversion       (Blank rows = not tested)   LOWER EXTREMITY MMT:     MMT Right eval Left eval  Hip flexion 4+ pain  4+  Hip extension      Hip abduction      Hip adduction      Hip internal rotation 4/5 pain  4+/5  Hip external rotation 4/5 pain  4+/5  Knee flexion 5/5 5/5  Knee extension 5/5 5/5  Ankle dorsiflexion      Ankle plantarflexion      Ankle inversion      Ankle eversion       (Blank rows = not tested)   LUMBAR SPECIAL TESTS:  Straight leg raise test: Negative and Single leg stance test: Negative No increased pain with air squats in lumbar spine Deeply squatting to the floor does increase neck pain which increases with time and straining  FUNCTIONAL TESTS:  NT on eval    GAIT: Distance walked: 150+ Assistive device utilized: None Level of assistance: Complete Independence Comments: NT    TODAY'S TREATMENT:  DATEMarlane M[facility] Adult PT Treatment:                                                DATE: 06/10/22 Manual Therapy: Kinesiotape, placed 1 "I" strip on each Upper trap  Neuromuscular re-ed: Sidelying head on soft ball to reduce neck tension Single arm reverse fly 3 lbs x 10, band red) x 10  Supine pelvic neutral  Tr A x 10  Unilateral clam  Bilateral clam  Used ball under feet and under pelvis for feedback Self Care: Pelvic PT vs EDS PT Over thinking and over recruiting muscles  Less is more mentality to avoid burning out     [facility] Adult PT Treatment:                                                DATE: 05/24/22 Therapeutic Exercise: Cervical rotation  Ball under head/neck cervical retraction x 10  Looped Red band overhead lift and chest press x 10 each  Horizontal bands red, green and blue x 10  Neutral spine, A/P  tilting with breathing coordination  Tr A x 10 with  and without ball under hips  Dead bug UE x 10 then LE x 10, ball under pelvic 90/90 hold with breathing x 30 sec x 3  90/90 toe taps x 10   Self Care: Muscles of core, Tr A vs Rectus Ab contraction, breathing and principles, hip flexor  05/18/22  PT eval, Self care    PATIENT EDUCATION:  Education details: PT, POC, core strength and stability , SI belt and soft collar OK when needed  Person educated: Patient Education method: Explanation and Demonstration Education comprehension: verbalized understanding   HOME EXERCISE PROGRAM: Access Code: [email or web address] URL: [email or web address] Date: 05/24/2022 Prepared by: Karie Mainland  Exercises - Seated Chin Tuck with Neck Elongation  - 2 x daily - 7 x weekly - 2 sets - 10 reps - 5 hold - Standing Shoulder Horizontal Abduction with Resistance  - 2 x daily - 7 x weekly - 2 sets - 10 reps - 5 hold - Hooklying Transversus Abdominis Palpation  - 2 x daily - 7 x weekly - 2 sets - 10 reps - 5 hold - Supine 90/90 Abdominal Bracing  - 2 x daily - 7 x weekly - 1 sets - 3-5 reps - 10-15  hold - Supine 90/90 Alternating Heel Touches with Posterior Pelvic Tilt  - 2 x daily - 7 x weekly - 2 sets - 10 reps - 5 hold   ASSESSMENT:   CLINICAL IMPRESSION:  Patient needed increased time today to relax her pelvis, her mind during and prior to exercises.  I tried to encouraged her to think less about isolating each muscle and being a bit more global.  It may help her pelvic floor relax as well if she can just focus on moving with bony landmarks in mind.  Session limited due to late arrival.  Will reach out to Pelvic PT she has seen previously for input on treatment.    OBJECTIVE IMPAIRMENTS: decreased activity tolerance, decreased strength, increased fascial restrictions, impaired flexibility, impaired UE functional use, improper body mechanics, postural dysfunction, and pain.    ACTIVITY  LIMITATIONS:  carrying, lifting, bending, sitting, standing, sleeping, and locomotion level   PARTICIPATION LIMITATIONS: laundry, interpersonal relationship, driving, shopping, community activity, and occupation   PERSONAL FACTORS: Past/current experiences and 3+ comorbidities: Anxiety, chronic pain, pelvic floor dysfunction  are also affecting patient's functional outcome.    REHAB POTENTIAL: Good   CLINICAL DECISION MAKING: Unstable/unpredictable   EVALUATION COMPLEXITY: High     GOALS: Goals reviewed with patient? Yes   SHORT TERM GOALS: Target date: 06/15/2022     Patient will be independent with basic home exercise program for core stability Baseline: Goal status: INITIAL   2.  Pt will be able to tolerate core routine in the clinic without increased pain Baseline:  Goal status: INITIAL   3.  Pt will be able to report Rt hip pinching/tightness lessened to occasional and min with walking/activity.  Baseline:  Goal status: INITIAL     LONG TERM GOALS: Target date: 07/13/2022     Pt will be able to show I with HEP in order to progress to more challenging resistance training.  Baseline:  Goal status: INITIAL   2.  Pt will be able to report no more than min increase in neck pain when toileting Baseline:  Goal status: INITIAL   3.  Pt will report improved rest at night due to less neck pain, rare need to wear neck brace at night.  Baseline:  Goal status: INITIAL   4.  Pt will be able to report no back strain with supine core work with maintained pelvic neutral Baseline:  Goal status: INITIAL   PLAN:   PT FREQUENCY: 1x/week   PT DURATION: 8 weeks   PLANNED INTERVENTIONS: Therapeutic exercises, Therapeutic activity, Neuromuscular re-education, Balance training, Patient/Family education, Self Care, Joint mobilization, Dry Needling, Electrical stimulation, Taping, Manual therapy, and Re-evaluation.   PLAN FOR NEXT SESSION: REFORMER   Core stability using soft Pilates  ball, props.  Consider Pilates.  Check benefit of her SI belt   Karie Mainland, PT 06/10/22 1:30 PM Phone: [phone] Fax: [phone]   Dason Mosley, PT 06/10/2022, 1:30 PM

## 2022-06-10 ENCOUNTER — Encounter: Payer: Self-pay | Admitting: Physical Therapy

## 2022-06-10 ENCOUNTER — Ambulatory Visit: Payer: 59 | Attending: Physician Assistant | Admitting: Physical Therapy

## 2022-06-10 DIAGNOSIS — M357 Hypermobility syndrome: Secondary | ICD-10-CM | POA: Insufficient documentation

## 2022-06-10 DIAGNOSIS — R252 Cramp and spasm: Secondary | ICD-10-CM | POA: Insufficient documentation

## 2022-06-23 NOTE — Therapy (Deleted)
OUTPATIENT PHYSICAL THERAPY TREATMENT NOTE   Patient Name: Brittany [name] MRN: [id number] DOB:[birth year], 32 y.o., female Today's Date: 06/23/2022  PCP: Jarold Motto PA REFERRING PROVIDER: Dr. 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  Rationale for Evaluation and Treatment Rehabilitation  PERTINENT HISTORY: see above  EDS, HSD  PRECAUTIONS: monitor lightheadedness, joint stability, pelvic pain   SUBJECTIVE:                                                                                                                                                                                      SUBJECTIVE STATEMENT:  Pt arrived late.  I have figured some things out about the exercises.  I bought the ball. I think my upper traps and neck take over. Pt having bloating and pelvis pain today.    PAIN:  Are you having pain? Yes: NPRS scale: 2/10 Pain location: neck and shoulders  Pain description: tight Aggravating factors: squatting, sleeping, malposition  Relieving  factors: neck brace, the right exercises    OBJECTIVE: (objective measures completed at initial evaluation unless otherwise dated)  Assessment (as on reg. Template) Beighton Scale Lumbar (_1/1) Knees (1_/2) Elbows (2_/2)  5th digit (_2/2) Thumb (_1/2)   7/9 Beighton  Comment on hips, shoulders see below      DIAGNOSTIC FINDINGS:  IMPRESSION: No significant spinal canal or neural foraminal stenosis.   PATIENT SURVEYS:  FOTO NT    SCREENING FOR RED FLAGS: Bowel or bladder incontinence: No Spinal tumors: No Cauda equina syndrome: No Compression fracture: No Abdominal aneurysm: No   COGNITION: Overall cognitive status: Within functional limits for tasks assessed  SENSATION: WFL   MUSCLE LENGTH: Hamstrings: 70-75 passive SLR  Thomas test: NT    POSTURE: rounded shoulders, forward head, and left pelvic obliquity   PALPATION: Gross tenderness to palpation to cervical thoracic and lumbar spine.  No areas of particular hypertonicity.  Skin is not particularly stretchy or velvety.   LUMBAR ROM:    AROM eval  Flexion Palms to floor   Extension WNL min pain   Right lateral flexion WNL  Left lateral flexion WNL  Right rotation WNL  Left rotation WNL    (Blank rows = not tested)   LOWER EXTREMITY ROM:      Passive/(Active) Right eval Left eval  Hip flexion WNL, pinching  WNL  Hip extension      Hip abduction      Hip adduction      Hip internal rotation WNL not excessive  WNL, slightly more than Rt.   Hip external rotation WNL not excessive  WNL   Knee flexion WNL WNL  Knee extension WNL Hyper   Ankle dorsiflexion      Ankle plantarflexion      Ankle inversion      Ankle eversion       (Blank rows = not tested)   LOWER EXTREMITY MMT:     MMT Right eval Left eval  Hip flexion 4+ pain  4+  Hip extension      Hip abduction      Hip adduction      Hip internal rotation 4/5 pain  4+/5  Hip external rotation 4/5 pain  4+/5   Knee flexion 5/5 5/5  Knee extension 5/5 5/5  Ankle dorsiflexion      Ankle plantarflexion      Ankle inversion      Ankle eversion       (Blank rows = not tested)   LUMBAR SPECIAL TESTS:  Straight leg raise test: Negative and Single leg stance test: Negative No increased pain with air squats in lumbar spine Deeply squatting to the floor does increase neck pain which increases with time and straining  FUNCTIONAL TESTS:  NT on eval    GAIT: Distance walked: 150+ Assistive device utilized: None Level of assistance: Complete Independence Comments: NT    TODAY'S TREATMENT:                                                                                                                              DATE:   [facility] Adult PT Treatment:                                                DATE: 06/10/22 Manual Therapy: Kinesiotape, placed 1 "I" strip on each Upper trap  Neuromuscular re-ed: Sidelying head on soft ball to reduce neck tension Single arm reverse fly 3 lbs x 10,  band red) x 10  Supine pelvic neutral  Tr A x 10  Unilateral clam  Bilateral clam  Used ball under feet and under pelvis for feedback Self Care: Pelvic PT vs EDS PT Over thinking and over recruiting muscles  Less is more mentality to avoid burning out     [facility] Adult PT Treatment:                                                DATE: 05/24/22 Therapeutic Exercise: Cervical rotation  Ball under head/neck cervical retraction x 10  Looped Red band overhead lift and chest press x 10 each  Horizontal bands red, green and blue x 10  Neutral spine, A/P tilting with breathing coordination  Tr A x 10 with  and without ball under hips  Dead bug UE x 10 then LE x 10, ball under pelvic 90/90 hold with breathing x 30 sec x 3  90/90 toe taps x 10   Self Care: Muscles of core, Tr A vs Rectus Ab contraction, breathing and principles, hip flexor  05/18/22  PT eval, Self care    PATIENT EDUCATION:  Education details: PT, POC,  core strength and stability , SI belt and soft collar OK when needed  Person educated: Patient Education method: Explanation and Demonstration Education comprehension: verbalized understanding   HOME EXERCISE PROGRAM: Access Code: [email or web address] URL: [email or web address] Date: 05/24/2022 Prepared by: Karie Mainland  Exercises - Seated Chin Tuck with Neck Elongation  - 2 x daily - 7 x weekly - 2 sets - 10 reps - 5 hold - Standing Shoulder Horizontal Abduction with Resistance  - 2 x daily - 7 x weekly - 2 sets - 10 reps - 5 hold - Hooklying Transversus Abdominis Palpation  - 2 x daily - 7 x weekly - 2 sets - 10 reps - 5 hold - Supine 90/90 Abdominal Bracing  - 2 x daily - 7 x weekly - 1 sets - 3-5 reps - 10-15  hold - Supine 90/90 Alternating Heel Touches with Posterior Pelvic Tilt  - 2 x daily - 7 x weekly - 2 sets - 10 reps - 5 hold   ASSESSMENT:   CLINICAL IMPRESSION:  Patient needed increased time today to relax her pelvis, her mind during and prior to exercises.  I tried to encouraged her to think less about isolating each muscle and being a bit more global.  It may help her pelvic floor relax as well if she can just focus on moving with bony landmarks in mind.  Session limited due to late arrival.  Will reach out to Pelvic PT she has seen previously for input on treatment.    OBJECTIVE IMPAIRMENTS: decreased activity tolerance, decreased strength, increased fascial restrictions, impaired flexibility, impaired UE functional use, improper body mechanics, postural dysfunction, and pain.    ACTIVITY LIMITATIONS: carrying, lifting, bending, sitting, standing, sleeping, and locomotion level   PARTICIPATION LIMITATIONS: laundry, interpersonal relationship, driving, shopping, community activity, and occupation   PERSONAL FACTORS: Past/current experiences and 3+ comorbidities: Anxiety, chronic pain, pelvic floor dysfunction  are also affecting patient's functional outcome.    REHAB  POTENTIAL: Good   CLINICAL DECISION MAKING: Unstable/unpredictable   EVALUATION COMPLEXITY: High     GOALS: Goals reviewed with patient? Yes   SHORT TERM GOALS: Target date: 06/15/2022     Patient will be independent  with basic home exercise program for core stability Baseline: Goal status: INITIAL   2.  Pt will be able to tolerate core routine in the clinic without increased pain Baseline:  Goal status: INITIAL   3.  Pt will be able to report Rt hip pinching/tightness lessened to occasional and min with walking/activity.  Baseline:  Goal status: INITIAL     LONG TERM GOALS: Target date: 07/13/2022     Pt will be able to show I with HEP in order to progress to more challenging resistance training.  Baseline:  Goal status: INITIAL   2.  Pt will be able to report no more than min increase in neck pain when toileting Baseline:  Goal status: INITIAL   3.  Pt will report improved rest at night due to less neck pain, rare need to wear neck brace at night.  Baseline:  Goal status: INITIAL   4.  Pt will be able to report no back strain with supine core work with maintained pelvic neutral Baseline:  Goal status: INITIAL   PLAN:   PT FREQUENCY: 1x/week   PT DURATION: 8 weeks   PLANNED INTERVENTIONS: Therapeutic exercises, Therapeutic activity, Neuromuscular re-education, Balance training, Patient/Family education, Self Care, Joint mobilization, Dry Needling, Electrical stimulation, Taping, Manual therapy, and Re-evaluation.   PLAN FOR NEXT SESSION: REFORMER   Core stability using soft Pilates ball, props.  Consider Pilates.  Check benefit of her SI belt   Karie Mainland, PT 06/23/22 4:44 PM Phone: [phone] Fax: [phone]   Tedric Leeth, PT 06/23/2022, 4:44 PM

## 2022-06-24 ENCOUNTER — Ambulatory Visit: Payer: 59 | Admitting: Physical Therapy

## 2022-06-28 NOTE — Therapy (Unsigned)
OUTPATIENT PHYSICAL THERAPY TREATMENT NOTE   Patient Name: Brittany [name] MRN: [id number] DOB:[birth year], 32 y.o., female Today's Date: 06/29/2022  PCP: Jarold Motto PA REFERRING PROVIDER: Dr. Clementeen Graham  END OF SESSION:   PT End of Session - 06/29/22 1157     Visit Number 4    Number of Visits 8    Date for PT Re-Evaluation 07/13/22    Authorization Type [employer]    PT Start Time 1155    PT Stop Time 1230    PT Time Calculation (min) 35 min    Activity Tolerance Patient tolerated treatment well    Behavior During Therapy WFL for tasks assessed/performed               Past Medical History:  Diagnosis Date   Ankle injury    Anxiety    Eating disorder    Frequent headaches    Past Surgical History:  Procedure Laterality Date   ANKLE SURGERY  2018   HERNIA REPAIR     WISDOM TOOTH EXTRACTION     Patient Active Problem List   Diagnosis Date Noted   Positive ANA (antinuclear antibody) 10/19/2019   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   Anorexia nervosa, restricting type, in full remission, moderate 02/16/2018   History of depression 02/16/2018   GAD (generalized anxiety disorder) 12/16/2017   Pelvic floor dysfunction 12/16/2017   Right hip pain 11/02/2017   Grade 2 ankle sprain, right, initial encounter 05/16/2016   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   Right ankle pain 03/03/2015   IBS (irritable bowel syndrome) 05/25/2009    REFERRING DIAG: M35.7 (ICD-10-CM) - Benign hypermobility syndrome  THERAPY DIAG:  Hypermobility syndrome  Cramp and spasm  Rationale for Evaluation and Treatment Rehabilitation  PERTINENT HISTORY: see above  EDS, HSD  PRECAUTIONS: monitor lightheadedness, joint stability, pelvic pain   SUBJECTIVE:                                                                                                                                                                                       SUBJECTIVE STATEMENT:  Pt arrived late.  I am always so tight in my neck and shoulders. I wrote downt he muscles that I have having tightness in . I started out strong, got all this pain and and now that I am trying to exercise it makes my neck tight.    Used TENS unit this AM for 2 hours.   PAIN:  Are you having pain? Yes: NPRS scale: 7/10 Pain location: neck and shoulders  Pain description: tight Aggravating factors: squatting, sleeping, malposition  Relieving factors: neck brace, the  right exercises   Lower back and hip not as much pain  but still tight    OBJECTIVE: (objective measures completed at initial evaluation unless otherwise dated)  Assessment (as on reg. Template) Beighton Scale Lumbar (_1/1) Knees (1_/2) Elbows (2_/2)  5th digit (_2/2) Thumb (_1/2)   7/9 Beighton  Comment on hips, shoulders see below      DIAGNOSTIC FINDINGS:  IMPRESSION: No significant spinal canal or neural foraminal stenosis.   PATIENT SURVEYS:  FOTO NT    SCREENING FOR RED FLAGS: Bowel or bladder incontinence: No Spinal tumors: No Cauda equina syndrome: No Compression fracture: No Abdominal aneurysm: No   COGNITION: Overall cognitive status: Within functional limits for tasks assessed                          SENSATION: WFL   MUSCLE LENGTH: Hamstrings: 70-75 passive SLR  Thomas test: NT    POSTURE: rounded shoulders, forward head, and left pelvic obliquity   PALPATION: Gross tenderness to palpation to cervical thoracic and lumbar spine.  No areas of particular hypertonicity.  Skin is not particularly stretchy or velvety.   LUMBAR ROM:    AROM eval  Flexion Palms to floor   Extension WNL min pain   Right lateral flexion WNL  Left lateral flexion WNL  Right rotation WNL  Left rotation WNL    (Blank rows = not tested)   LOWER EXTREMITY ROM:      Passive/(Active) Right eval Left eval  Hip flexion WNL, pinching  WNL  Hip extension      Hip abduction      Hip  adduction      Hip internal rotation WNL not excessive  WNL, slightly more than Rt.   Hip external rotation WNL not excessive  WNL   Knee flexion WNL WNL  Knee extension WNL Hyper   Ankle dorsiflexion      Ankle plantarflexion      Ankle inversion      Ankle eversion       (Blank rows = not tested)   LOWER EXTREMITY MMT:     MMT Right eval Left eval  Hip flexion 4+ pain  4+  Hip extension      Hip abduction      Hip adduction      Hip internal rotation 4/5 pain  4+/5  Hip external rotation 4/5 pain  4+/5  Knee flexion 5/5 5/5  Knee extension 5/5 5/5  Ankle dorsiflexion      Ankle plantarflexion      Ankle inversion      Ankle eversion       (Blank rows = not tested)   LUMBAR SPECIAL TESTS:  Straight leg raise test: Negative and Single leg stance test: Negative No increased pain with air squats in lumbar spine Deeply squatting to the floor does increase neck pain which increases with time and straining  FUNCTIONAL TESTS:  NT on eval    GAIT: Distance walked: 150+ Assistive device utilized: None Level of assistance: Complete Independence Comments: NT    TODAY'S TREATMENT:  [facility]                                                DATE: 06[facility]/22 Therapeutic Exercise: Footwork  Double leg Parallel heels, toes narrow and wide Pilates V heels and toes narrow and wide  2 red 1 blue  Single leg  Heel in parallel and turnout 2 red 1 blue  Double leg heel raise and calf stretch, prancing  Bridging 2 red 1 blue x 10 mod cues for control and articulation   Supine Arm Arcs, 1 red 1 yellow x 10  Self Care: Calming the ANS, book for reference ([employer])  Neck tension, core and Pilates concepts    [facility] Adult PT Treatment:                                                DATE: 06/10/22 Manual Therapy: Kinesiotape,  placed 1 "I" strip on each Upper trap  Neuromuscular re-ed: Sidelying head on soft ball to reduce neck tension Single arm reverse fly 3 lbs x 10, band red) x 10  Supine pelvic neutral  Tr A x 10  Unilateral clam  Bilateral clam  Used ball under feet and under pelvis for feedback Self Care: Pelvic PT vs EDS PT Over thinking and over recruiting muscles  Less is more mentality to avoid burning out     [facility] Adult PT Treatment:                                                DATE: 05/24/22 Therapeutic Exercise: Cervical rotation  Ball under head/neck cervical retraction x 10  Looped Red band overhead lift and chest press x 10 each  Horizontal bands red, green and blue x 10  Neutral spine, A/P tilting with breathing coordination  Tr A x 10 with  and without ball under hips  Dead bug UE x 10 then LE x 10, ball under pelvic 90/90 hold with breathing x 30 sec x 3  90/90 toe taps x 10   Self Care: Muscles of core, Tr A vs Rectus Ab contraction, breathing and principles, hip flexor  05/18/22  PT eval, Self care    PATIENT EDUCATION:  Education details: PT, POC, core strength and stability , SI belt and soft collar OK when needed  Person educated: Patient Education method: Explanation and Demonstration Education comprehension: verbalized understanding   HOME EXERCISE PROGRAM: Access Code: [email or web address] URL: [email or web address] Date: 05/24/2022 Prepared by: Karie Mainland  Exercises - Seated Chin Tuck with Neck Elongation  - 2 x daily - 7 x weekly - 2 sets - 10 reps - 5 hold - Standing Shoulder Horizontal Abduction with Resistance  - 2 x daily - 7 x weekly - 2 sets - 10 reps - 5 hold - Hooklying Transversus Abdominis Palpation  - 2 x daily - 7 x weekly - 2 sets - 10 reps - 5 hold - Supine 90/90 Abdominal Bracing  - 2 x daily - 7 x weekly - 1 sets - 3-5 reps - 10-15  hold - Supine 90/90 Alternating Heel Touches with Posterior Pelvic Tilt  - 2 x daily - 7 x weekly - 2 sets -  10 reps - 5 hold   ASSESSMENT:   CLINICAL IMPRESSION:  Patient arrived late today, reinforced need to arrive on time to address the multiple joint issues she has.  Has tried dry needling for back and hips and would be willing to try again. She has had Sucralose and Botox as well. Patient was introduced to Pilates Reformer exercises in the room without lights on in order to address multiple areas and provide a calming environment.  Patient did report less neck tension with this, focusing on lower body work and coordinated breathing to reduce stimulation to her neck and upper body muscles. Cont POC.     OBJECTIVE IMPAIRMENTS: decreased activity tolerance, decreased strength, increased fascial restrictions, impaired flexibility, impaired UE functional use, improper body mechanics, postural dysfunction, and pain.    ACTIVITY LIMITATIONS: carrying, lifting, bending, sitting, standing, sleeping, and locomotion level   PARTICIPATION LIMITATIONS: laundry, interpersonal relationship, driving, shopping, community activity, and occupation   PERSONAL FACTORS: Past/current experiences and 3+ comorbidities: Anxiety, chronic pain, pelvic floor dysfunction  are also affecting patient's functional outcome.    REHAB POTENTIAL: Good   CLINICAL DECISION MAKING: Unstable/unpredictable   EVALUATION COMPLEXITY: High     GOALS: Goals reviewed with patient? Yes   SHORT TERM GOALS: Target date: 06/15/2022     Patient will be independent with basic home exercise program for core stability Baseline: Goal status: met   2.  Pt will be able to tolerate core routine in the clinic without increased pain Baseline:  Goal status: ongoing    3.  Pt will be able to report Rt hip pinching/tightness lessened to occasional and min with walking/activity.  Baseline:  Goal status:ongoing      LONG TERM GOALS: Target date: 07/13/2022     Pt will be able to show I with HEP in order to progress to more challenging  resistance training.  Baseline:  Goal status: ongoing    2.  Pt will be able to report no more than min increase in neck pain when toileting Baseline:  Goal status:ongoing   3.  Pt will report improved rest at night due to less neck pain, rare need to wear neck brace at night.  Baseline:  Goal status: ongoing    4.  Pt will be able to report no back strain with supine core work with maintained pelvic neutral Baseline:  Goal status: ongoing    PLAN:   PT FREQUENCY: 1x/week   PT DURATION: 8 weeks   PLANNED INTERVENTIONS: Therapeutic exercises, Therapeutic activity, Neuromuscular re-education, Balance training, Patient/Family education, Self Care, Joint mobilization, Dry Needling, Electrical stimulation, Taping, Manual therapy, and Re-evaluation.   PLAN FOR NEXT SESSION: REFORMER   Core stability using soft Pilates ball, props.  Consider Pilates.  Check benefit of her SI belt   Karie Mainland, PT 06/29/22 1:45 PM Phone: [phone] Fax: [phone]   Pretty Weltman, PT 06/29/2022, 1:45 PM

## 2022-06-29 ENCOUNTER — Encounter: Payer: Self-pay | Admitting: Physical Therapy

## 2022-06-29 ENCOUNTER — Ambulatory Visit: Payer: 59 | Admitting: Physical Therapy

## 2022-06-29 DIAGNOSIS — M357 Hypermobility syndrome: Secondary | ICD-10-CM | POA: Diagnosis not present

## 2022-06-29 DIAGNOSIS — R252 Cramp and spasm: Secondary | ICD-10-CM

## 2022-07-02 NOTE — Therapy (Unsigned)
OUTPATIENT PHYSICAL THERAPY TREATMENT NOTE   Patient Name: Brittany [name] MRN: [id number] DOB:Mar 26, 1990, 32 y.o., female Today's Date: 07/05/2022  PCP: Jarold Motto PA REFERRING PROVIDER: Dr. Clementeen Graham  END OF SESSION:   PT End of Session - 07/05/22 1426     Visit Number 5    Number of Visits 8    Date for PT Re-Evaluation 07/13/22    Authorization Type U[employer]    PT Start Time 1425    PT Stop Time 1500    PT Time Calculation (min) 35 min    Activity Tolerance Patient tolerated treatment well    Behavior During Therapy WFL for tasks assessed/performed                Past Medical History:  Diagnosis Date   Ankle injury    Anxiety    Eating disorder    Frequent headaches    Past Surgical History:  Procedure Laterality Date   ANKLE SURGERY  2018   HERNIA REPAIR     WISDOM TOOTH EXTRACTION     Patient Active Problem List   Diagnosis Date Noted   Positive ANA (antinuclear antibody) 10/19/2019   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   Anorexia nervosa, restricting type, in full remission, moderate 02/16/2018   History of depression 02/16/2018   GAD (generalized anxiety disorder) 12/16/2017   Pelvic floor dysfunction 12/16/2017   Right hip pain 11/02/2017   Grade 2 ankle sprain, right, initial encounter 05/16/2016   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   Right ankle pain 03/03/2015   IBS (irritable bowel syndrome) 05/25/2009    REFERRING DIAG: M35.7 (ICD-10-CM) - Benign hypermobility syndrome  THERAPY DIAG:  Hypermobility syndrome  Cramp and spasm  Rationale for Evaluation and Treatment Rehabilitation  PERTINENT HISTORY: see above  EDS, HSD  PRECAUTIONS: monitor lightheadedness, joint stability, pelvic pain   SUBJECTIVE:                                                                                                                                                                                       SUBJECTIVE STATEMENT:  I have been looking around [employer]'s you tube.  I still tense the wrong things when I exercise. I limp when I jog. When I lift my legs I tense my neck.   PAIN:  Are you having pain? Yes: NPRS scale: 7/10 Pain location: neck and shoulders  Pain description: tight Aggravating factors: squatting, sleeping, malposition  Relieving factors: neck brace, the right exercises   Lower back and hip not as much pain  but still tight    OBJECTIVE: (objective measures completed at initial  evaluation unless otherwise dated)  Assessment (as on reg. Template) Beighton Scale Lumbar (_1/1) Knees (1_/2) Elbows (2_/2)  5th digit (_2/2) Thumb (_1/2)   7/9 Beighton  Comment on hips, shoulders see below      DIAGNOSTIC FINDINGS:  IMPRESSION: No significant spinal canal or neural foraminal stenosis.   PATIENT SURVEYS:  FOTO NT    SCREENING FOR RED FLAGS: Bowel or bladder incontinence: No Spinal tumors: No Cauda equina syndrome: No Compression fracture: No Abdominal aneurysm: No   COGNITION: Overall cognitive status: Within functional limits for tasks assessed                          SENSATION: WFL   MUSCLE LENGTH: Hamstrings: 70-75 passive SLR  Thomas test: NT    POSTURE: rounded shoulders, forward head, and left pelvic obliquity   PALPATION: Gross tenderness to palpation to cervical thoracic and lumbar spine.  No areas of particular hypertonicity.  Skin is not particularly stretchy or velvety.   LUMBAR ROM:    AROM eval  Flexion Palms to floor   Extension WNL min pain   Right lateral flexion WNL  Left lateral flexion WNL  Right rotation WNL  Left rotation WNL    (Blank rows = not tested)   LOWER EXTREMITY ROM:      Passive/(Active) Right eval Left eval  Hip flexion WNL, pinching  WNL  Hip extension      Hip abduction      Hip adduction      Hip internal rotation WNL not excessive  WNL, slightly more than Rt.   Hip external rotation  WNL not excessive  WNL   Knee flexion WNL WNL  Knee extension WNL Hyper   Ankle dorsiflexion      Ankle plantarflexion      Ankle inversion      Ankle eversion       (Blank rows = not tested)   LOWER EXTREMITY MMT:     MMT Right eval Left eval  Hip flexion 4+ pain  4+  Hip extension      Hip abduction      Hip adduction      Hip internal rotation 4/5 pain  4+/5  Hip external rotation 4/5 pain  4+/5  Knee flexion 5/5 5/5  Knee extension 5/5 5/5  Ankle dorsiflexion      Ankle plantarflexion      Ankle inversion      Ankle eversion       (Blank rows = not tested)   LUMBAR SPECIAL TESTS:  Straight leg raise test: Negative and Single leg stance test: Negative No increased pain with air squats in lumbar spine Deeply squatting to the floor does increase neck pain which increases with time and straining  FUNCTIONAL TESTS:  NT on eval    GAIT: Distance walked: 150+ Assistive device utilized: None Level of assistance: Complete Independence Comments: NT    TODAY'S TREATMENT:  [facility]:                                                DATE: 07/05/22 Therapeutic Exercise: Supine head turns and chin tuck  Supine arm arcs  x 10 ball under pelvis Small ROM double leg fall out pelvis on ball  Bridge with ball x 10 cues for lumbar  Bridge with Pilates circle x 10 SLR with Pilates circle on opposite thigh x 10  Bird dog UE and LE x 5 each then combo x 8 each side  Bear plank 2 x 30 sec   [facility] Adult PT Treatment:                                                DATE: 06/29/22 Therapeutic Exercise: Footwork  Double leg Parallel heels, toes narrow and wide Pilates V heels and toes narrow and wide  2 red 1 blue  Single leg  Heel in parallel and turnout 2 red 1 blue  Double leg heel raise and calf stretch, prancing  Bridging 2 red 1  blue x 10 mod cues for control and articulation   Supine Arm Arcs, 1 red 1 yellow x 10  Self Care: Calming the ANS, book for reference ([employer])  Neck tension, core and Pilates concepts    [facility] Adult PT Treatment:                                                DATE: 06/10/22 Manual Therapy: Kinesiotape, placed 1 "I" strip on each Upper trap  Neuromuscular re-ed: Sidelying head on soft ball to reduce neck tension Single arm reverse fly 3 lbs x 10, band red) x 10  Supine pelvic neutral  Tr A x 10  Unilateral clam  Bilateral clam  Used ball under feet and under pelvis for feedback Self Care: Pelvic PT vs EDS PT Over thinking and over recruiting muscles  Less is more mentality to avoid burning out     [facility] Adult PT Treatment:                                                DATE: 05/24/22 Therapeutic Exercise: Cervical rotation  Ball under head/neck cervical retraction x 10  Looped Red band overhead lift and chest press x 10 each  Horizontal bands red, green and blue x 10  Neutral spine, A/P tilting with breathing coordination  Tr A x 10 with  and without ball under hips  Dead bug UE x 10 then LE x 10, ball under pelvic 90/90 hold with breathing x 30 sec x 3  90/90 toe taps x 10   Self Care: Muscles of core, Tr A vs Rectus Ab contraction, breathing and principles, hip flexor  05/18/22  PT eval, Self care    PATIENT EDUCATION:  Education details: PT, POC, core strength and stability , SI belt and soft collar OK when  needed  Person educated: Patient Education method: Explanation and Demonstration Education comprehension: verbalized understanding   HOME EXERCISE PROGRAM: Access Code: [email or web address] URL: [email or web address] Date: 05/24/2022 Prepared by: Karie Mainland  Exercises - Seated Chin Tuck with Neck Elongation  - 2 x daily - 7 x weekly - 2 sets - 10 reps - 5 hold - Standing Shoulder Horizontal Abduction with Resistance  - 2 x daily - 7 x weekly - 2  sets - 10 reps - 5 hold - Hooklying Transversus Abdominis Palpation  - 2 x daily - 7 x weekly - 2 sets - 10 reps - 5 hold - Supine 90/90 Abdominal Bracing  - 2 x daily - 7 x weekly - 1 sets - 3-5 reps - 10-15  hold - Supine 90/90 Alternating Heel Touches with Posterior Pelvic Tilt  - 2 x daily - 7 x weekly - 2 sets - 10 reps - 5 hold   ASSESSMENT:   CLINICAL IMPRESSION:  Patient continues to learn about what she can do to reduce tension in her neck (undue) with exercises for core and lower body.  She actually showed excellent head and neck, shoulder organization in quadruped and was able to hold a bear plank for 30 sec without losing control.  Her neck was less tense when in quadruped than in supine.  She continues to need cues to relax and focus more on gross motor output vs fine tuning.  She will cont to benefit from PT and will also speak to PT who does dry needling getting treated for cervicals.   OBJECTIVE IMPAIRMENTS: decreased activity tolerance, decreased strength, increased fascial restrictions, impaired flexibility, impaired UE functional use, improper body mechanics, postural dysfunction, and pain.    ACTIVITY LIMITATIONS: carrying, lifting, bending, sitting, standing, sleeping, and locomotion level   PARTICIPATION LIMITATIONS: laundry, interpersonal relationship, driving, shopping, community activity, and occupation   PERSONAL FACTORS: Past/current experiences and 3+ comorbidities: Anxiety, chronic pain, pelvic floor dysfunction  are also affecting patient's functional outcome.    REHAB POTENTIAL: Good   CLINICAL DECISION MAKING: Unstable/unpredictable   EVALUATION COMPLEXITY: High     GOALS: Goals reviewed with patient? Yes   SHORT TERM GOALS: Target date: 06/15/2022     Patient will be independent with basic home exercise program for core stability Baseline: Goal status: met   2.  Pt will be able to tolerate core routine in the clinic without increased pain Baseline:  07/05/22 able to do this today unsure about after effects  Goal status: ongoing    3.  Pt will be able to report Rt hip pinching/tightness lessened to occasional and min with walking/activity.  Baseline:  Goal status:ongoing      LONG TERM GOALS: Target date: 07/13/2022     Pt will be able to show I with HEP in order to progress to more challenging resistance training.  Baseline:  Goal status: ongoing    2.  Pt will be able to report no more than min increase in neck pain when toileting Baseline:  Goal status:ongoing   3.  Pt will report improved rest at night due to less neck pain, rare need to wear neck brace at night.  Baseline:  Goal status: ongoing    4.  Pt will be able to report no back strain with supine core work with maintained pelvic neutral Baseline:  Goal status: ongoing    PLAN:   PT FREQUENCY: 1x/week   PT DURATION: 8 weeks   PLANNED INTERVENTIONS: Therapeutic exercises, Therapeutic activity,  Neuromuscular re-education, Balance training, Patient/Family education, Self Care, Joint mobilization, Dry Needling, Electrical stimulation, Taping, Manual therapy, and Re-evaluation.   PLAN FOR NEXT SESSION: REFORMER   Core stability using soft Pilates ball, props.  Consider Pilates.  Check benefit of her SI belt   Karie Mainland, PT 07/05/22 2:28 PM Phone: [phone] Fax: [phone]   Ger Ringenberg, PT 07/05/2022, 2:28 PM

## 2022-07-05 ENCOUNTER — Encounter: Payer: Self-pay | Admitting: Physical Therapy

## 2022-07-05 ENCOUNTER — Ambulatory Visit: Payer: 59 | Attending: Physician Assistant | Admitting: Physical Therapy

## 2022-07-05 DIAGNOSIS — M545 Low back pain, unspecified: Secondary | ICD-10-CM | POA: Insufficient documentation

## 2022-07-05 DIAGNOSIS — R490 Dysphonia: Secondary | ICD-10-CM | POA: Diagnosis present

## 2022-07-05 DIAGNOSIS — R252 Cramp and spasm: Secondary | ICD-10-CM | POA: Diagnosis present

## 2022-07-05 DIAGNOSIS — R278 Other lack of coordination: Secondary | ICD-10-CM | POA: Insufficient documentation

## 2022-07-05 DIAGNOSIS — G8929 Other chronic pain: Secondary | ICD-10-CM | POA: Insufficient documentation

## 2022-07-05 DIAGNOSIS — M357 Hypermobility syndrome: Secondary | ICD-10-CM | POA: Insufficient documentation

## 2022-07-12 NOTE — Therapy (Unsigned)
OUTPATIENT PHYSICAL THERAPY TREATMENT NOTE RENEWAL   Patient Name: Brittany [name] MRN: [id number] DOB:1990/09/16, 32 y.o., female Today's Date: 07/13/2022  PCP: Jarold Motto PA REFERRING PROVIDER: Dr. Clementeen Graham  END OF SESSION:   PT End of Session - 07/13/22 1109     Visit Number 6    Number of Visits 18    Date for PT Re-Evaluation 08/24/22    Authorization Type U[employer]    PT Start Time 1105    PT Stop Time 1147    PT Time Calculation (min) 42 min    Activity Tolerance Patient tolerated treatment well;Patient limited by pain    Behavior During Therapy WFL for tasks assessed/performed                 Past Medical History:  Diagnosis Date   Ankle injury    Anxiety    Eating disorder    Frequent headaches    Past Surgical History:  Procedure Laterality Date   ANKLE SURGERY  2018   HERNIA REPAIR     WISDOM TOOTH EXTRACTION     Patient Active Problem List   Diagnosis Date Noted   Positive ANA (antinuclear antibody) 10/19/2019   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   Anorexia nervosa, restricting type, in full remission, moderate 02/16/2018   History of depression 02/16/2018   GAD (generalized anxiety disorder) 12/16/2017   Pelvic floor dysfunction 12/16/2017   Right hip pain 11/02/2017   Grade 2 ankle sprain, right, initial encounter 05/16/2016   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   Right ankle pain 03/03/2015   IBS (irritable bowel syndrome) 05/25/2009    REFERRING DIAG: M35.7 (ICD-10-CM) - Benign hypermobility syndrome  THERAPY DIAG:  Hypermobility syndrome  Cramp and spasm  Rationale for Evaluation and Treatment Rehabilitation  PERTINENT HISTORY: see above  EDS, HSD  PRECAUTIONS: monitor lightheadedness, joint stability, pelvic pain   SUBJECTIVE:                                                                                                                                                                                       SUBJECTIVE STATEMENT:   I am tense and tight.  Patient reports continued tension in pelvis floor, Rt hip, neck and shoulders.  She brought her ball today.  She knows she will need consistent work to see functional gains.    PAIN:  Are you having pain? Yes: NPRS scale: not rated today /10 Pain location: neck and shoulders  Pain description: tight Aggravating factors: squatting, sleeping, malposition  Relieving factors: neck brace, the right exercises   Lower back and hip not as much pain  but still tight    OBJECTIVE: (objective measures completed at initial evaluation unless otherwise dated)  Assessment (as on reg. Template) Beighton Scale Lumbar (_1/1) Knees (1_/2) Elbows (2_/2)  5th digit (_2/2) Thumb (_1/2)   7/9 Beighton  Comment on hips, shoulders see below      DIAGNOSTIC FINDINGS:  IMPRESSION: No significant spinal canal or neural foraminal stenosis.   PATIENT SURVEYS:  FOTO NT    SCREENING FOR RED FLAGS: Bowel or bladder incontinence: No Spinal tumors: No Cauda equina syndrome: No Compression fracture: No Abdominal aneurysm: No   COGNITION: Overall cognitive status: Within functional limits for tasks assessed                          SENSATION: WFL   MUSCLE LENGTH: Hamstrings: 70-75 passive SLR  Thomas test: NT    POSTURE: rounded shoulders, forward head, and left pelvic obliquity   PALPATION: Gross tenderness to palpation to cervical thoracic and lumbar spine.  No areas of particular hypertonicity.  Skin is not particularly stretchy or velvety.   LUMBAR ROM:    AROM eval  Flexion Palms to floor   Extension WNL min pain   Right lateral flexion WNL  Left lateral flexion WNL  Right rotation WNL  Left rotation WNL    (Blank rows = not tested)   LOWER EXTREMITY ROM:      Passive/(Active) Right eval Left eval  Hip flexion WNL, pinching  WNL  Hip extension      Hip abduction      Hip adduction      Hip  internal rotation WNL not excessive  WNL, slightly more than Rt.   Hip external rotation WNL not excessive  WNL   Knee flexion WNL WNL  Knee extension WNL Hyper   Ankle dorsiflexion      Ankle plantarflexion      Ankle inversion      Ankle eversion       (Blank rows = not tested)   LOWER EXTREMITY MMT:     MMT Right eval Left eval Rt  07/13/22  Hip flexion 4+ pain  4+ NT   Hip extension       Hip abduction       Hip adduction       Hip internal rotation 4/5 pain  4+/5 5/5  Hip external rotation 4/5 pain  4+/5 5/5  Knee flexion 5/5 5/5 5/5  Knee extension 5/5 5/5 5/5  Ankle dorsiflexion       Ankle plantarflexion       Ankle inversion       Ankle eversion        (Blank rows = not tested)   LUMBAR SPECIAL TESTS:   Straight leg raise test: Negative and Single leg stance test: Negative No increased pain with air squats in lumbar spine Deeply squatting to the floor does increase neck pain which increases with time and straining  FUNCTIONAL TESTS:  NT on eval    GAIT: Distance walked: 150+ Assistive device utilized: None Level of assistance: Complete Independence Comments: NT    TODAY'S TREATMENT:  [facility] PT Treatment:                                                DATE: 07/13/22 Therapeutic Exercise: Gentle trunk rotation Ant/post tilt on soft ball Supine arm arcs overhead and T unilateral Bilateral goal post  Supine cat and cow  Lower abdominals 90/90 hold added alt knee extension x 10  Horizontal abd yellow band  Standing on wall isometric hold bilateral 3 x 30 sec  Cervical nods used towel around neck, small ROM  Lateral flexion and rotation small ROM with towel  Self Care: Plan of care, neck tension with normal activities    [facility] Adult PT Treatment:                                                DATE: 07/05/22 Therapeutic  Exercise: Supine head turns and chin tuck  Supine arm arcs  x 10 ball under pelvis Small ROM double leg fall out pelvis on ball  Bridge with ball x 10 cues for lumbar  Bridge with Pilates circle x 10 SLR with Pilates circle on opposite thigh x 10  Bird dog UE and LE x 5 each then combo x 8 each side  Bear plank 2 x 30 sec   [facility] Adult PT Treatment:                                                DATE: 06/29/22 Therapeutic Exercise: Footwork  Double leg Parallel heels, toes narrow and wide Pilates V heels and toes narrow and wide  2 red 1 blue  Single leg  Heel in parallel and turnout 2 red 1 blue  Double leg heel raise and calf stretch, prancing  Bridging 2 red 1 blue x 10 mod cues for control and articulation   Supine Arm Arcs, 1 red 1 yellow x 10  Self Care: Calming the ANS, book for reference ([employer])  Neck tension, core and Pilates concepts    [facility] Adult PT Treatment:                                                DATE: 06/10/22 Manual Therapy: Kinesiotape, placed 1 "I" strip on each Upper trap  Neuromuscular re-ed: Sidelying head on soft ball to reduce neck tension Single arm reverse fly 3 lbs x 10, band red) x 10  Supine pelvic neutral  Tr A x 10  Unilateral clam  Bilateral clam  Used ball under feet and under pelvis for feedback Self Care: Pelvic PT vs EDS PT Over thinking and over recruiting muscles  Less is more mentality to avoid burning out     [facility] Adult PT Treatment:  DATE: 05/24/22 Therapeutic Exercise: Cervical rotation  Ball under head/neck cervical retraction x 10  Looped Red band overhead lift and chest press x 10 each  Horizontal bands red, green and blue x 10  Neutral spine, A/P tilting with breathing coordination  Tr A x 10 with  and without ball under hips  Dead bug UE x 10 then LE x 10, ball under pelvic 90/90 hold with breathing x 30 sec x 3  90/90 toe taps x 10   Self Care: Muscles of  core, Tr A vs Rectus Ab contraction, breathing and principles, hip flexor  05/18/22  PT eval, Self care    PATIENT EDUCATION:  Education details: PT, POC, core strength and stability , SI belt and soft collar OK when needed  Person educated: Patient Education method: Explanation and Demonstration Education comprehension: verbalized understanding   HOME EXERCISE PROGRAM: Access Code: [email or web address] URL: [email or web address] Date: 05/24/2022 Prepared by: Karie Mainland  Exercises - Seated Chin Tuck with Neck Elongation  - 2 x daily - 7 x weekly - 2 sets - 10 reps - 5 hold - Standing Shoulder Horizontal Abduction with Resistance  - 2 x daily - 7 x weekly - 2 sets - 10 reps - 5 hold - Hooklying Transversus Abdominis Palpation  - 2 x daily - 7 x weekly - 2 sets - 10 reps - 5 hold - Supine 90/90 Abdominal Bracing  - 2 x daily - 7 x weekly - 1 sets - 3-5 reps - 10-15  hold - Supine 90/90 Alternating Heel Touches with Posterior Pelvic Tilt  - 2 x daily - 7 x weekly - 2 sets - 10 reps - 5 hold  - cat/camel Bridge + march   ASSESSMENT:   CLINICAL IMPRESSION:    Patient continues have excellent strength and stability as demonstrated with her exercises in the clinic.  Her sessions are primary about relaxing her accessory muscles (usually cervicals) and coordination of movement.  She is a highly complex patient with a long history of pelvic floor dysfunction.  She clearly has a relationship between jaw and neck pain  and her pelvic floor. Her Rt hip does have more tension in external and internal rotation relative to her L. Strategies for improving her pain include more global, less specific exercises but that is quite challenging for her to do.  She admits to over thinking the movements but is learning how to overcome this. She was renewed for 6 more weeks of PT up to 2 x per week due to the fact that much of the session is spent in discussion and limited in movement. She would like to try dry  needling to see if that can free up her neck to allow more natural movement patterns.    OBJECTIVE IMPAIRMENTS: decreased activity tolerance, decreased strength, increased fascial restrictions, impaired flexibility, impaired UE functional use, improper body mechanics, postural dysfunction, and pain.    ACTIVITY LIMITATIONS: carrying, lifting, bending, sitting, standing, sleeping, and locomotion level   PARTICIPATION LIMITATIONS: laundry, interpersonal relationship, driving, shopping, community activity, and occupation   PERSONAL FACTORS: Past/current experiences and 3+ comorbidities: Anxiety, chronic pain, pelvic floor dysfunction  are also affecting patient's functional outcome.    REHAB POTENTIAL: Good   CLINICAL DECISION MAKING: Unstable/unpredictable   EVALUATION COMPLEXITY: High     GOALS: Goals reviewed with patient? Yes   SHORT TERM GOALS: Target date: 06/15/2022     Patient will be independent with basic home exercise program for core stability Baseline: Goal  status: met   2.  Pt will be able to tolerate core routine in the clinic without increased pain Baseline: 07/05/22 able to do this today unsure about after effects  07/13/22 usually the same at times, better Goal status: met    3.  Pt will be able to report Rt hip pinching/tightness lessened to occasional and min with walking/activity.  Baseline: 07/13/22 feels tight to her , not sure if it is better or not  Goal status:ongoing      LONG TERM GOALS: Target date: 07/13/2022     Pt will be able to show I with HEP in order to progress to more challenging resistance training.  Baseline:  Goal status: ongoing    2.  Pt will be able to report no more than min increase in neck pain when toileting Baseline:  Goal status: ongoing   3.  Pt will report improved rest at night due to less neck pain, rare need to wear neck brace at night.  Baseline: 07/13/22: wears this sometimes , not as effective as she once thought Goal  status: ongoing    4.  Pt will be able to report no back strain with supine core work with maintained pelvic neutral Baseline:  Goal status: ongoing    PLAN:   PT FREQUENCY: 1-2 x/week   PT DURATION: 8 weeks   PLANNED INTERVENTIONS: Therapeutic exercises, Therapeutic activity, Neuromuscular re-education, Balance training, Patient/Family education, Self Care, Joint mobilization, Dry Needling, Electrical stimulation, Taping, Manual therapy, and Re-evaluation.   PLAN FOR NEXT SESSION: eventually Chiropractor. DN to cervicals. PNE   Core stability using soft Pilates ball, props.  Consider Pilates.  Check benefit of her SI belt   Karie Mainland, PT 07/13/22 12:56 PM Phone: [phone] Fax: [phone]

## 2022-07-13 ENCOUNTER — Encounter: Payer: Self-pay | Admitting: Physical Therapy

## 2022-07-13 ENCOUNTER — Ambulatory Visit: Payer: 59 | Admitting: Physical Therapy

## 2022-07-13 DIAGNOSIS — M357 Hypermobility syndrome: Secondary | ICD-10-CM

## 2022-07-13 DIAGNOSIS — R252 Cramp and spasm: Secondary | ICD-10-CM

## 2022-07-16 NOTE — Therapy (Signed)
OUTPATIENT PHYSICAL THERAPY TREATMENT NOTE    Patient Name: Brittany [name] MRN: [id number] DOB:23-Nov-1990, 32 y.o., female Today's Date: 07/19/2022  PCP: Jarold Motto PA REFERRING PROVIDER: Dr. Clementeen Graham  END OF SESSION:   PT End of Session - 07/19/22 1417     Visit Number 7    Number of Visits 18    Date for PT Re-Evaluation 08/24/22    Authorization Type [employer]    PT Start Time 1418    PT Stop Time 1500    PT Time Calculation (min) 42 min    Activity Tolerance Patient tolerated treatment well;Patient limited by pain    Behavior During Therapy [facility] for tasks assessed/performed                  Past Medical History:  Diagnosis Date   Ankle injury    Anxiety    Eating disorder    Frequent headaches    Past Surgical History:  Procedure Laterality Date   ANKLE SURGERY  2018   HERNIA REPAIR     WISDOM TOOTH EXTRACTION     Patient Active Problem List   Diagnosis Date Noted   Positive ANA (antinuclear antibody) 10/19/2019   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   Anorexia nervosa, restricting type, in full remission, moderate 02/16/2018   History of depression 02/16/2018   GAD (generalized anxiety disorder) 12/16/2017   Pelvic floor dysfunction 12/16/2017   Right hip pain 11/02/2017   Grade 2 ankle sprain, right, initial encounter 05/16/2016   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   Right ankle pain 03/03/2015   IBS (irritable bowel syndrome) 05/25/2009    REFERRING DIAG: M35.7 (ICD-10-CM) - Benign hypermobility syndrome  THERAPY DIAG:  Hypermobility syndrome  Cramp and spasm  Rationale for Evaluation and Treatment Rehabilitation  PERTINENT HISTORY: see above  EDS, HSD  PRECAUTIONS: monitor lightheadedness, joint stability, pelvic pain   SUBJECTIVE:                                                                                                                                                                                       SUBJECTIVE STATEMENT: Pt with questions about her app (Medbridge exercises). Pt with pain "everywhere". I was working and recovering from working.   PAIN:  Are you having pain? Yes: NPRS scale: not rated today /10 Pain location: neck and shoulders  Pain description: tight Aggravating factors: squatting, sleeping, malposition  Relieving factors: neck brace, the right exercises   Lower back and hip not as much pain  but still tight    OBJECTIVE: (objective measures completed at initial evaluation unless otherwise dated)  Assessment (  as on reg. Template) Beighton Scale Lumbar (_1/1) Knees (1_/2) Elbows (2_/2)  5th digit (_2/2) Thumb (_1/2)   7/9 Beighton  Comment on hips, shoulders see below      DIAGNOSTIC FINDINGS:  IMPRESSION: No significant spinal canal or neural foraminal stenosis.   PATIENT SURVEYS:  FOTO NT    SCREENING FOR RED FLAGS: Bowel or bladder incontinence: No Spinal tumors: No Cauda equina syndrome: No Compression fracture: No Abdominal aneurysm: No   COGNITION: Overall cognitive status: Within functional limits for tasks assessed                          SENSATION: WFL   MUSCLE LENGTH: Hamstrings: 70-75 passive SLR  Thomas test: NT    POSTURE: rounded shoulders, forward head, and left pelvic obliquity   PALPATION: Gross tenderness to palpation to cervical thoracic and lumbar spine.  No areas of particular hypertonicity.  Skin is not particularly stretchy or velvety.   LUMBAR ROM:    AROM eval  Flexion Palms to floor   Extension WNL min pain   Right lateral flexion WNL  Left lateral flexion WNL  Right rotation WNL  Left rotation WNL    (Blank rows = not tested)   LOWER EXTREMITY ROM:      Passive/(Active) Right eval Left eval  Hip flexion WNL, pinching  WNL  Hip extension      Hip abduction      Hip adduction      Hip internal rotation WNL not excessive  WNL, slightly more than Rt.   Hip external  rotation WNL not excessive  WNL   Knee flexion WNL WNL  Knee extension WNL Hyper   Ankle dorsiflexion      Ankle plantarflexion      Ankle inversion      Ankle eversion       (Blank rows = not tested)   LOWER EXTREMITY MMT:     MMT Right eval Left eval Rt  07/13/22  Hip flexion 4+ pain  4+ NT   Hip extension       Hip abduction       Hip adduction       Hip internal rotation 4/5 pain  4+/5 5/5  Hip external rotation 4/5 pain  4+/5 5/5  Knee flexion 5/5 5/5 5/5  Knee extension 5/5 5/5 5/5  Ankle dorsiflexion       Ankle plantarflexion       Ankle inversion       Ankle eversion        (Blank rows = not tested)   LUMBAR SPECIAL TESTS:   Straight leg raise test: Negative and Single leg stance test: Negative No increased pain with air squats in lumbar spine Deeply squatting to the floor does increase neck pain which increases with time and straining  FUNCTIONAL TESTS:  NT on eval    GAIT: Distance walked: 150+ Assistive device utilized: None Level of assistance: Complete Independence Comments: NT    TODAY'S TREATMENT:  [facility]                                                DATE: 07/19/22 Therapeutic Exercise: Supine breathing, LE activation with foam roller  Hip flexor stretch, active with horizontal foam roller  Bridge LE's on bolster Mod Q-ped clam and then hip extension  Prone scapular retraction Prone goal post with ER x 10 then Lift off table x 10 Prone upper back/cobra x 10  Prone TrA with alternating knee bent  Prone hip extension x 10    [facility] Adult PT Treatment:                                                DATE: 07/13/22 Therapeutic Exercise: Gentle trunk rotation Ant/post tilt on soft ball Supine arm arcs overhead and T unilateral Bilateral goal post  Supine cat and cow  Lower abdominals 90/90 hold added  alt knee extension x 10  Horizontal abd yellow band  Standing on wall isometric hold bilateral 3 x 30 sec  Cervical nods used towel around neck, small ROM  Lateral flexion and rotation small ROM with towel  Self Care: Plan of care, neck tension with normal activities    [facility] Adult PT Treatment:                                                DATE: 07/05/22 Therapeutic Exercise: Supine head turns and chin tuck  Supine arm arcs  x 10 ball under pelvis Small ROM double leg fall out pelvis on ball  Bridge with ball x 10 cues for lumbar  Bridge with Pilates circle x 10 SLR with Pilates circle on opposite thigh x 10  Bird dog UE and LE x 5 each then combo x 8 each side  Bear plank 2 x 30 sec   [facility] Adult PT Treatment:                                                DATE: 06/29/22 Therapeutic Exercise: Footwork  Double leg Parallel heels, toes narrow and wide Pilates V heels and toes narrow and wide  2 red 1 blue  Single leg  Heel in parallel and turnout 2 red 1 blue  Double leg heel raise and calf stretch, prancing  Bridging 2 red 1 blue x 10 mod cues for control and articulation   Supine Arm Arcs, 1 red 1 yellow x 10  Self Care: Calming the ANS, book for reference (T[employer])  Neck tension, core and Pilates concepts    [facility] Adult PT Treatment:                                                DATE: 06/10/22 Manual Therapy: Kinesiotape, placed 1 "I" strip on each  Upper trap  Neuromuscular re-ed: Sidelying head on soft ball to reduce neck tension Single arm reverse fly 3 lbs x 10, band red) x 10  Supine pelvic neutral  Tr A x 10  Unilateral clam  Bilateral clam  Used ball under feet and under pelvis for feedback Self Care: Pelvic PT vs EDS PT Over thinking and over recruiting muscles  Less is more mentality to avoid burning out     [facility] Adult PT Treatment:                                                DATE: 05/24/22 Therapeutic Exercise: Cervical rotation  Ball under  head/neck cervical retraction x 10  Looped Red band overhead lift and chest press x 10 each  Horizontal bands red, green and blue x 10  Neutral spine, A/P tilting with breathing coordination  Tr A x 10 with  and without ball under hips  Dead bug UE x 10 then LE x 10, ball under pelvic 90/90 hold with breathing x 30 sec x 3  90/90 toe taps x 10   Self Care: Muscles of core, Tr A vs Rectus Ab contraction, breathing and principles, hip flexor  05/18/22  PT eval, Self care    PATIENT EDUCATION:  Education details: PT, POC, core strength and stability , SI belt and soft collar OK when needed  Person educated: Patient Education method: Explanation and Demonstration Education comprehension: verbalized understanding   HOME EXERCISE PROGRAM: Access Code: [email or web address] URL: [email or web address] Date: 05/24/2022 Prepared by: Karie Mainland  Exercises - Seated Chin Tuck with Neck Elongation  - 2 x daily - 7 x weekly - 2 sets - 10 reps - 5 hold - Standing Shoulder Horizontal Abduction with Resistance  - 2 x daily - 7 x weekly - 2 sets - 10 reps - 5 hold - Hooklying Transversus Abdominis Palpation  - 2 x daily - 7 x weekly - 2 sets - 10 reps - 5 hold - Supine 90/90 Abdominal Bracing  - 2 x daily - 7 x weekly - 1 sets - 3-5 reps - 10-15  hold - Supine 90/90 Alternating Heel Touches with Posterior Pelvic Tilt  - 2 x daily - 7 x weekly - 2 sets - 10 reps - 5 hold  - cat/camel Bridge + march   ASSESSMENT:   CLINICAL IMPRESSION: Patient tolerated session well today, with PT making efforts to provide a relaxed calm environment to reduce sympathetic nervous system input.  It appeared she had less upper trap and neck tension in prone.  Patient did do some active stretching , nothing static and to end range to reduce the tightness she fees in her hip and UEs/neck. She is looking forward to dry needling but I reinforced the fact that it will be a portion of the session and a trial to see if she  benefits. Upper trap would likely be the most beneficial. She needed only min cueing through the session for proper alignment and technique.   OBJECTIVE IMPAIRMENTS: decreased activity tolerance, decreased strength, increased fascial restrictions, impaired flexibility, impaired UE functional use, improper body mechanics, postural dysfunction, and pain.    ACTIVITY LIMITATIONS: carrying, lifting, bending, sitting, standing, sleeping, and locomotion level   PARTICIPATION LIMITATIONS: laundry, interpersonal relationship, driving, shopping, community activity, and occupation   PERSONAL FACTORS: Past/current experiences and 3+ comorbidities:  Anxiety, chronic pain, pelvic floor dysfunction  are also affecting patient's functional outcome.    REHAB POTENTIAL: Good   CLINICAL DECISION MAKING: Unstable/unpredictable   EVALUATION COMPLEXITY: High     GOALS: Goals reviewed with patient? Yes   SHORT TERM GOALS: Target date: 06/15/2022     Patient will be independent with basic home exercise program for core stability Baseline: Goal status: met   2.  Pt will be able to tolerate core routine in the clinic without increased pain Baseline: 07/05/22 able to do this today unsure about after effects  07/13/22 usually the same at times, better Goal status: met    3.  Pt will be able to report Rt hip pinching/tightness lessened to occasional and min with walking/activity.  Baseline: 07/13/22 feels tight to her , not sure if it is better or not  Goal status:ongoing      LONG TERM GOALS: Target date: 07/13/2022     Pt will be able to show I with HEP in order to progress to more challenging resistance training.  Baseline:  Goal status: ongoing    2.  Pt will be able to report no more than min increase in neck pain when toileting Baseline:  Goal status: ongoing   3.  Pt will report improved rest at night due to less neck pain, rare need to wear neck brace at night.  Baseline: 07/13/22: wears this  sometimes , not as effective as she once thought Goal status: ongoing    4.  Pt will be able to report no back strain with supine core work with maintained pelvic neutral Baseline:  Goal status: ongoing    PLAN:   PT FREQUENCY: 1-2 x/week   PT DURATION: 8 weeks   PLANNED INTERVENTIONS: Therapeutic exercises, Therapeutic activity, Neuromuscular re-education, Balance training, Patient/Family education, Self Care, Joint mobilization, Dry Needling, Electrical stimulation, Taping, Manual therapy, and Re-evaluation.   PLAN FOR NEXT SESSION: eventually Chiropractor. DN to cervicals. PNE   Core stability using soft Pilates ball, props.  Consider Pilates.  Check benefit of her SI belt   Karie Mainland, PT 07/19/22 3:06 PM Phone: [phone] Fax: [phone]

## 2022-07-19 ENCOUNTER — Ambulatory Visit: Payer: 59 | Admitting: Physical Therapy

## 2022-07-19 ENCOUNTER — Encounter: Payer: Self-pay | Admitting: Physical Therapy

## 2022-07-19 DIAGNOSIS — M357 Hypermobility syndrome: Secondary | ICD-10-CM

## 2022-07-19 DIAGNOSIS — R252 Cramp and spasm: Secondary | ICD-10-CM

## 2022-07-22 ENCOUNTER — Encounter: Payer: Self-pay | Admitting: Physical Therapy

## 2022-07-22 ENCOUNTER — Ambulatory Visit: Payer: 59 | Admitting: Physical Therapy

## 2022-07-22 DIAGNOSIS — R278 Other lack of coordination: Secondary | ICD-10-CM

## 2022-07-22 DIAGNOSIS — G8929 Other chronic pain: Secondary | ICD-10-CM

## 2022-07-22 DIAGNOSIS — M357 Hypermobility syndrome: Secondary | ICD-10-CM

## 2022-07-22 DIAGNOSIS — R490 Dysphonia: Secondary | ICD-10-CM

## 2022-07-22 DIAGNOSIS — R252 Cramp and spasm: Secondary | ICD-10-CM

## 2022-07-22 NOTE — Therapy (Signed)
OUTPATIENT PHYSICAL THERAPY TREATMENT NOTE    Patient Name: Renita Brocks MRN: [id number] DOB:Dec 12, 1990, 32 y.o., female Today's Date: 07/22/2022  PCP: Jarold Motto PA REFERRING PROVIDER: Dr. Clementeen Graham  END OF SESSION:   PT End of Session - 07/22/22 1417     Visit Number 8    Number of Visits 18    Date for PT Re-Evaluation 08/24/22    Authorization Type [employer]    PT Start Time 1418    PT Stop Time 1516    PT Time Calculation (min) 58 min    Activity Tolerance Patient tolerated treatment well;Patient limited by pain    Behavior During Therapy [facility] for tasks assessed/performed                   Past Medical History:  Diagnosis Date   Ankle injury    Anxiety    Eating disorder    Frequent headaches    Past Surgical History:  Procedure Laterality Date   ANKLE SURGERY  2018   HERNIA REPAIR     WISDOM TOOTH EXTRACTION     Patient Active Problem List   Diagnosis Date Noted   Positive ANA (antinuclear antibody) 10/19/2019   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   Anorexia nervosa, restricting type, in full remission, moderate 02/16/2018   History of depression 02/16/2018   GAD (generalized anxiety disorder) 12/16/2017   Pelvic floor dysfunction 12/16/2017   Right hip pain 11/02/2017   Grade 2 ankle sprain, right, initial encounter 05/16/2016   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   Right ankle pain 03/03/2015   IBS (irritable bowel syndrome) 05/25/2009    REFERRING DIAG: M35.7 (ICD-10-CM) - Benign hypermobility syndrome  THERAPY DIAG:  Hypermobility syndrome  Cramp and spasm  Dysphonia  Other lack of coordination  Chronic midline low back pain without sciatica  Rationale for Evaluation and Treatment Rehabilitation  PERTINENT HISTORY: see above  EDS, HSD  PRECAUTIONS: monitor lightheadedness, joint stability, pelvic pain   SUBJECTIVE:                                                                                                                                                                                       SUBJECTIVE STATEMENT: I had Ginger Garner for dry needling in 2020.  I have pain everywhere. But today I have really tight neck muscles today.  Not rated pain to day but overall painful with any movements today.  PAIN:  Are you having pain? Yes: NPRS scale: not rated today /10 Pain location: neck and shoulders  Pain description: tight Aggravating factors: squatting, sleeping, malposition  Relieving factors: neck brace,  the right exercises   Lower back and hip not as much pain  but still tight    OBJECTIVE: (objective measures completed at initial evaluation unless otherwise dated)  Assessment (as on reg. Template) Beighton Scale Lumbar (_1/1) Knees (1_/2) Elbows (2_/2)  5th digit (_2/2) Thumb (_1/2)   7/9 Beighton  Comment on hips, shoulders see below      DIAGNOSTIC FINDINGS:  IMPRESSION: No significant spinal canal or neural foraminal stenosis.   PATIENT SURVEYS:  FOTO NT    SCREENING FOR RED FLAGS: Bowel or bladder incontinence: No Spinal tumors: No Cauda equina syndrome: No Compression fracture: No Abdominal aneurysm: No   COGNITION: Overall cognitive status: Within functional limits for tasks assessed                          SENSATION: WFL   MUSCLE LENGTH: Hamstrings: 70-75 passive SLR  Thomas test: NT    POSTURE: rounded shoulders, forward head, and left pelvic obliquity   PALPATION: Gross tenderness to palpation to cervical thoracic and lumbar spine.  No areas of particular hypertonicity.  Skin is not particularly stretchy or velvety.   LUMBAR ROM:    AROM eval  Flexion Palms to floor   Extension WNL min pain   Right lateral flexion WNL  Left lateral flexion WNL  Right rotation WNL  Left rotation WNL    (Blank rows = not tested)   LOWER EXTREMITY ROM:      Passive/(Active) Right eval Left eval  Hip flexion WNL,  pinching  WNL  Hip extension      Hip abduction      Hip adduction      Hip internal rotation WNL not excessive  WNL, slightly more than Rt.   Hip external rotation WNL not excessive  WNL   Knee flexion WNL WNL  Knee extension WNL Hyper   Ankle dorsiflexion      Ankle plantarflexion      Ankle inversion      Ankle eversion       (Blank rows = not tested)   LOWER EXTREMITY MMT:     MMT Right eval Left eval Rt  07/13/22  Hip flexion 4+ pain  4+ NT   Hip extension       Hip abduction       Hip adduction       Hip internal rotation 4/5 pain  4+/5 5/5  Hip external rotation 4/5 pain  4+/5 5/5  Knee flexion 5/5 5/5 5/5  Knee extension 5/5 5/5 5/5  Ankle dorsiflexion       Ankle plantarflexion       Ankle inversion       Ankle eversion        (Blank rows = not tested)   LUMBAR SPECIAL TESTS:   Straight leg raise test: Negative and Single leg stance test: Negative No increased pain with air squats in lumbar spine Deeply squatting to the floor does increase neck pain which increases with time and straining  FUNCTIONAL TESTS:  NT on eval    GAIT: Distance walked: 150+ Assistive device utilized: None Level of assistance: Complete Independence Comments: NT    TODAY'S TREATMENT:  [facility]:                                                DATE: 07-22-22 Therapeutic Exercise: Supine diaphragmatic breathing Quadriped thread the needle stretch Open mouth lower jaw opening to R and L to maximize stretch post TPDN Deep neck flexor stretch Childs pose Manual Therapy: STW over R masseter, R scalene/ SCM,  bil UT/ LS, bil rhomboids.  R C-3 to C-6 cervical paraspinals. Myofascial of scalenes Trigger Point Dry-Needling performed     by Garen Lah Treatment instructions: Expect mild to moderate muscle soreness. S/S of pneumothorax if  dry needled over a lung field, and to seek immediate medical attention should they occur. Patient verbalized understanding of these instructions and education.  Patient Consent Given: Yes Education handout provided: Previously provided Muscles treated:  R SCM R scalene, bil UT/ LS, bil rhomboids Electrical stimulation performed: No Parameters: N/A Treatment response/outcome: twitch response noted, pt noted relief Modality Moist Hot pack  after TPDN   [facility] Adult PT Treatment:                                                DATE: 07/19/22 Therapeutic Exercise: Supine breathing, LE activation with foam roller  Hip flexor stretch, active with horizontal foam roller  Bridge LE's on bolster Mod Q-ped clam and then hip extension  Prone scapular retraction Prone goal post with ER x 10 then Lift off table x 10 Prone upper back/cobra x 10  Prone TrA with alternating knee bent  Prone hip extension x 10    [facility] Adult PT Treatment:                                                DATE: 07/13/22 Therapeutic Exercise: Gentle trunk rotation Ant/post tilt on soft ball Supine arm arcs overhead and T unilateral Bilateral goal post  Supine cat and cow  Lower abdominals 90/90 hold added alt knee extension x 10  Horizontal abd yellow band  Standing on wall isometric hold bilateral 3 x 30 sec  Cervical nods used towel around neck, small ROM  Lateral flexion and rotation small ROM with towel  Self Care: Plan of care, neck tension with normal activities    [facility] Adult PT Treatment:                                                DATE: 07/05/22 Therapeutic Exercise: Supine head turns and chin tuck  Supine arm arcs  x 10 ball under pelvis Small ROM double leg fall out pelvis on ball  Bridge with ball x 10 cues for lumbar  Bridge with Pilates circle x 10 SLR with Pilates circle on opposite thigh x 10  Bird dog UE and LE x 5 each then combo x 8 each side  Bear plank 2 x 30 sec   [facility] Adult PT Treatment:  DATE: 06/29/22 Therapeutic Exercise: Footwork  Double leg Parallel heels, toes narrow and wide Pilates V heels and toes narrow and wide  2 red 1 blue  Single leg  Heel in parallel and turnout 2 red 1 blue  Double leg heel raise and calf stretch, prancing  Bridging 2 red 1 blue x 10 mod cues for control and articulation   Supine Arm Arcs, 1 red 1 yellow x 10  Self Care: Calming the ANS, book for reference ([employer])  Neck tension, core and Pilates concepts    [facility] Adult PT Treatment:                                                DATE: 06/10/22 Manual Therapy: Kinesiotape, placed 1 "I" strip on each Upper trap  Neuromuscular re-ed: Sidelying head on soft ball to reduce neck tension Single arm reverse fly 3 lbs x 10, band red) x 10  Supine pelvic neutral  Tr A x 10  Unilateral clam  Bilateral clam  Used ball under feet and under pelvis for feedback Self Care: Pelvic PT vs EDS PT Over thinking and over recruiting muscles  Less is more mentality to avoid burning out     [facility] Adult PT Treatment:                                                DATE: 05/24/22 Therapeutic Exercise: Cervical rotation  Ball under head/neck cervical retraction x 10  Looped Red band overhead lift and chest press x 10 each  Horizontal bands red, green and blue x 10  Neutral spine, A/P tilting with breathing coordination  Tr A x 10 with  and without ball under hips  Dead bug UE x 10 then LE x 10, ball under pelvic 90/90 hold with breathing x 30 sec x 3  90/90 toe taps x 10   Self Care: Muscles of core, Tr A vs Rectus Ab contraction, breathing and principles, hip flexor  05/18/22  PT eval, Self care    PATIENT EDUCATION:  Education details: PT, POC, core strength and stability , SI belt and soft collar OK when needed  Person educated: Patient Education method: Explanation and Demonstration Education comprehension: verbalized understanding    HOME EXERCISE PROGRAM: Access Code: [email or web address] URL: [email or web address] Date: 05/24/2022 Prepared by: Karie Mainland  Exercises - Seated Chin Tuck with Neck Elongation  - 2 x daily - 7 x weekly - 2 sets - 10 reps - 5 hold - Standing Shoulder Horizontal Abduction with Resistance  - 2 x daily - 7 x weekly - 2 sets - 10 reps - 5 hold - Hooklying Transversus Abdominis Palpation  - 2 x daily - 7 x weekly - 2 sets - 10 reps - 5 hold - Supine 90/90 Abdominal Bracing  - 2 x daily - 7 x weekly - 1 sets - 3-5 reps - 10-15  hold - Supine 90/90 Alternating Heel Touches with Posterior Pelvic Tilt  - 2 x daily - 7 x weekly - 2 sets - 10 reps - 5 hold  - cat/camel Bridge + march   ASSESSMENT:   CLINICAL IMPRESSION: Ms Scripter enters clinic today with pain and interest in TPDN for muscle  pain  She has experienced TPDN with good result before and consents to [facility]. Pt is closely monitored and brings list of specific muscles she has concerns about including scalenes, SCM and TMJ masseter.   Pt was closely monitored throughout session and ended with decreased muscle tension after manual and gentle movement and exercise.  Pt wanted to be placed on wait list for additional TPDN.  Will continue to work with primary PT for maximizing strength and decreasing pain with motion.  No adverse reactions and no further questions at end of session.   Patient tolerated session well today, with PT making efforts to provide a relaxed calm environment to reduce sympathetic nervous system input.  It appeared she had less upper trap and neck tension in prone.  Patient did do some active stretching , nothing static and to end range to reduce the tightness she fees in her hip and UEs/neck. She is looking forward to dry needling but I reinforced the fact that it will be a portion of the session and a trial to see if she benefits. Upper trap would likely be the most beneficial. She needed only min cueing through the session  for proper alignment and technique.   OBJECTIVE IMPAIRMENTS: decreased activity tolerance, decreased strength, increased fascial restrictions, impaired flexibility, impaired UE functional use, improper body mechanics, postural dysfunction, and pain.    ACTIVITY LIMITATIONS: carrying, lifting, bending, sitting, standing, sleeping, and locomotion level   PARTICIPATION LIMITATIONS: laundry, interpersonal relationship, driving, shopping, community activity, and occupation   PERSONAL FACTORS: Past/current experiences and 3+ comorbidities: Anxiety, chronic pain, pelvic floor dysfunction  are also affecting patient's functional outcome.    REHAB POTENTIAL: Good   CLINICAL DECISION MAKING: Unstable/unpredictable   EVALUATION COMPLEXITY: High     GOALS: Goals reviewed with patient? Yes   SHORT TERM GOALS: Target date: 06/15/2022     Patient will be independent with basic home exercise program for core stability Baseline: Goal status: met   2.  Pt will be able to tolerate core routine in the clinic without increased pain Baseline: 07/05/22 able to do this today unsure about after effects  07/13/22 usually the same at times, better Goal status: met    3.  Pt will be able to report Rt hip pinching/tightness lessened to occasional and min with walking/activity.  Baseline: 07/13/22 feels tight to her , not sure if it is better or not  Goal status:ongoing      LONG TERM GOALS: Target date: 07/13/2022     Pt will be able to show I with HEP in order to progress to more challenging resistance training.  Baseline:  Goal status: ongoing    2.  Pt will be able to report no more than min increase in neck pain when toileting Baseline:  Goal status: ongoing   3.  Pt will report improved rest at night due to less neck pain, rare need to wear neck brace at night.  Baseline: 07/13/22: wears this sometimes , not as effective as she once thought Goal status: ongoing    4.  Pt will be able to report no  back strain with supine core work with maintained pelvic neutral Baseline:  Goal status: ongoing    PLAN:   PT FREQUENCY: 1-2 x/week   PT DURATION: 8 weeks   PLANNED INTERVENTIONS: Therapeutic exercises, Therapeutic activity, Neuromuscular re-education, Balance training, Patient/Family education, Self Care, Joint mobilization, Dry Needling, Electrical stimulation, Taping, Manual therapy, and Re-evaluation.   PLAN FOR NEXT SESSION: eventually  Chiropractor. DN to cervicals. PNE   Core stability using soft Pilates ball, props.  Consider Pilates.  Check benefit of her SI belt Garen Lah, PT, [facility] Certified Exercise Expert for the Aging Adult  07/22/22 3:18 PM Phone: [phone] Fax: [phone]

## 2022-07-22 NOTE — Patient Instructions (Addendum)

## 2022-07-23 NOTE — Therapy (Unsigned)
OUTPATIENT PHYSICAL THERAPY TREATMENT NOTE    Patient Name: Brittany [name] MRN: [id number] DOB:[birth year], 32 y.o., female Today's Date: 07/26/2022  PCP: Jarold Motto PA REFERRING PROVIDER: Dr. Clementeen Graham  END OF SESSION:   PT End of Session - 07/26/22 1424     Visit Number 9    Number of Visits 18    Date for PT Re-Evaluation 08/24/22    Authorization Type [employer]    PT Start Time 1420    PT Stop Time 1505    PT Time Calculation (min) 45 min    Activity Tolerance Patient tolerated treatment well;Patient limited by pain    Behavior During Therapy [facility] for tasks assessed/performed                    Past Medical History:  Diagnosis Date   Ankle injury    Anxiety    Eating disorder    Frequent headaches    Past Surgical History:  Procedure Laterality Date   ANKLE SURGERY  2018   HERNIA REPAIR     WISDOM TOOTH EXTRACTION     Patient Active Problem List   Diagnosis Date Noted   Positive ANA (antinuclear antibody) 10/19/2019   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   Anorexia nervosa, restricting type, in full remission, moderate 02/16/2018   History of depression 02/16/2018   GAD (generalized anxiety disorder) 12/16/2017   Pelvic floor dysfunction 12/16/2017   Right hip pain 11/02/2017   Grade 2 ankle sprain, right, initial encounter 05/16/2016   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   Right ankle pain 03/03/2015   IBS (irritable bowel syndrome) 05/25/2009    REFERRING DIAG: M35.7 (ICD-10-CM) - Benign hypermobility syndrome  THERAPY DIAG:  Hypermobility syndrome  Cramp and spasm  Rationale for Evaluation and Treatment Rehabilitation  PERTINENT HISTORY: see above  EDS, HSD  PRECAUTIONS: monitor lightheadedness, joint stability, pelvic pain   SUBJECTIVE:                                                                                                                                                                                       SUBJECTIVE STATEMENT: No relief after the needling.  The Rt side of my body is really the problem.  I have to spend hours daily getting pain relief with acupuncture mats, heat, stretching, breathing.  I feel like I could sing a bit after.  I do have PTSD and see a mental health therapist. I want to do something about my Rt hip.   PAIN:  Are you having pain? Yes: NPRS scale: not rated today /10 Pain location: neck and shoulders  Pain description: tight Aggravating factors: squatting, sleeping, malposition  Relieving factors: neck brace, the right exercises   Lower back and hip not as much pain  but still tight    OBJECTIVE: (objective measures completed at initial evaluation unless otherwise dated)  Assessment (as on reg. Template) Beighton Scale Lumbar (_1/1) Knees (1_/2) Elbows (2_/2)  5th digit (_2/2) Thumb (_1/2)   7/9 Beighton  Comment on hips, shoulders see below      DIAGNOSTIC FINDINGS:  IMPRESSION: No significant spinal canal or neural foraminal stenosis.   PATIENT SURVEYS:  FOTO NT    SCREENING FOR RED FLAGS: Bowel or bladder incontinence: No Spinal tumors: No Cauda equina syndrome: No Compression fracture: No Abdominal aneurysm: No   COGNITION: Overall cognitive status: Within functional limits for tasks assessed                          SENSATION: WFL   MUSCLE LENGTH: Hamstrings: 70-75 passive SLR  Thomas test: NT    POSTURE: rounded shoulders, forward head, and left pelvic obliquity   PALPATION: Gross tenderness to palpation to cervical thoracic and lumbar spine.  No areas of particular hypertonicity.  Skin is not particularly stretchy or velvety.   LUMBAR ROM:    AROM eval  Flexion Palms to floor   Extension WNL min pain   Right lateral flexion WNL  Left lateral flexion WNL  Right rotation WNL  Left rotation WNL    (Blank rows = not tested)   LOWER EXTREMITY ROM:      Passive/(Active) Right eval  Left eval  Hip flexion WNL, pinching  WNL  Hip extension      Hip abduction      Hip adduction      Hip internal rotation WNL not excessive  WNL, slightly more than Rt.   Hip external rotation WNL not excessive  WNL   Knee flexion WNL WNL  Knee extension WNL Hyper   Ankle dorsiflexion      Ankle plantarflexion      Ankle inversion      Ankle eversion       (Blank rows = not tested)   LOWER EXTREMITY MMT:     MMT Right eval Left eval Rt  07/13/22  Hip flexion 4+ pain  4+ NT   Hip extension       Hip abduction       Hip adduction       Hip internal rotation 4/5 pain  4+/5 5/5  Hip external rotation 4/5 pain  4+/5 5/5  Knee flexion 5/5 5/5 5/5  Knee extension 5/5 5/5 5/5  Ankle dorsiflexion       Ankle plantarflexion       Ankle inversion       Ankle eversion        (Blank rows = not tested)   LUMBAR SPECIAL TESTS:   Straight leg raise test: Negative and Single leg stance test: Negative No increased pain with air squats in lumbar spine Deeply squatting to the floor does increase neck pain which increases with time and straining  FUNCTIONAL TESTS:  NT on eval    GAIT: Distance walked: 150+ Assistive device utilized: None Level of assistance: Complete Independence Comments: NT    TODAY'S TREATMENT:  [facility]                                                DATE: 07/26/22 Therapeutic Exercise: Hip isometrics with PT manual  Reciprocal inhibition : black band Arcs (extension) and knee /hip extension x 10 each  Hamstring bridge: double and single leg bridge alt. Used 8 inch step Glute bridge calves on step x 10  Manual Therapy: PROM Rt hip Supine cervical soft tissue work to multiple trigger points in periscapular and posterior cervicals, levator scapula.  Self Care: Relaxation, working through solutions and self care  pain strategies  Soft tissue self massage, use of balls/roller   [facility] Adult PT Treatment:                                                DATE: 07-22-22 Therapeutic Exercise: Supine diaphragmatic breathing Quadriped thread the needle stretch Open mouth lower jaw opening to R and L to maximize stretch post TPDN Deep neck flexor stretch Childs pose Manual Therapy: STW over R masseter, R scalene/ SCM,  bil UT/ LS, bil rhomboids.  R C-3 to C-6 cervical paraspinals. Myofascial of scalenes Trigger Point Dry-Needling performed     by Garen Lah Treatment instructions: Expect mild to moderate muscle soreness. S/S of pneumothorax if dry needled over a lung field, and to seek immediate medical attention should they occur. Patient verbalized understanding of these instructions and education.  Patient Consent Given: Yes Education handout provided: Previously provided Muscles treated:  R SCM R scalene, bil UT/ LS, bil rhomboids Electrical stimulation performed: No Parameters: N/A Treatment response/outcome: twitch response noted, pt noted relief Modality Moist Hot pack  after TPDN   [facility] Adult PT Treatment:                                                DATE: 07/19/22 Therapeutic Exercise: Supine breathing, LE activation with foam roller  Hip flexor stretch, active with horizontal foam roller  Bridge LE's on bolster Mod Q-ped clam and then hip extension  Prone scapular retraction Prone goal post with ER x 10 then Lift off table x 10 Prone upper back/cobra x 10  Prone TrA with alternating knee bent  Prone hip extension x 10    [facility] Adult PT Treatment:                                                DATE: 07/13/22 Therapeutic Exercise: Gentle trunk rotation Ant/post tilt on soft ball Supine arm arcs overhead and T unilateral Bilateral goal post  Supine cat and cow  Lower abdominals 90/90 hold added alt knee extension x 10  Horizontal abd yellow band  Standing on wall isometric hold  bilateral 3 x 30 sec  Cervical nods used towel around neck, small ROM  Lateral flexion and rotation small ROM with towel  Self Care: Plan of care, neck tension with normal activities  [facility] Adult PT Treatment:                                                DATE: 07/05/22 Therapeutic Exercise: Supine head turns and chin tuck  Supine arm arcs  x 10 ball under pelvis Small ROM double leg fall out pelvis on ball  Bridge with ball x 10 cues for lumbar  Bridge with Pilates circle x 10 SLR with Pilates circle on opposite thigh x 10  Bird dog UE and LE x 5 each then combo x 8 each side  Bear plank 2 x 30 sec   [facility] Adult PT Treatment:                                                DATE: 06/29/22 Therapeutic Exercise: Footwork  Double leg Parallel heels, toes narrow and wide Pilates V heels and toes narrow and wide  2 red 1 blue  Single leg  Heel in parallel and turnout 2 red 1 blue  Double leg heel raise and calf stretch, prancing  Bridging 2 red 1 blue x 10 mod cues for control and articulation   Supine Arm Arcs, 1 red 1 yellow x 10  Self Care: Calming the ANS, book for reference ([employer])  Neck tension, core and Pilates concepts    [facility] Adult PT Treatment:                                                DATE: 06/10/22 Manual Therapy: Kinesiotape, placed 1 "I" strip on each Upper trap  Neuromuscular re-ed: Sidelying head on soft ball to reduce neck tension Single arm reverse fly 3 lbs x 10, band red) x 10  Supine pelvic neutral  Tr A x 10  Unilateral clam  Bilateral clam  Used ball under feet and under pelvis for feedback Self Care: Pelvic PT vs EDS PT Over thinking and over recruiting muscles  Less is more mentality to avoid burning out     [facility] Adult PT Treatment:                                                DATE: 05/24/22 Therapeutic Exercise: Cervical rotation  Ball under head/neck cervical retraction x 10  Looped Red band overhead lift and chest press x 10  each  Horizontal bands red, green and blue x 10  Neutral spine, A/P tilting with breathing coordination  Tr A x 10 with  and without ball under hips  Dead bug UE x 10 then LE x 10, ball under pelvic 90/90 hold with breathing x 30 sec x 3  90/90 toe taps x 10   Self Care: Muscles of core, Tr A vs Rectus Ab contraction, breathing and principles, hip flexor  05/18/22  PT eval, Self care    PATIENT EDUCATION:  Education details: PT, POC, core strength and stability , SI belt and soft collar OK when needed  Person  educated: Patient Education method: Medical illustrator Education comprehension: verbalized understanding   HOME EXERCISE PROGRAM: Access Code: [email or web address] URL: [email or web address] Date: 05/24/2022 Prepared by: Karie Mainland  Exercises - Seated Chin Tuck with Neck Elongation  - 2 x daily - 7 x weekly - 2 sets - 10 reps - 5 hold - Standing Shoulder Horizontal Abduction with Resistance  - 2 x daily - 7 x weekly - 2 sets - 10 reps - 5 hold - Hooklying Transversus Abdominis Palpation  - 2 x daily - 7 x weekly - 2 sets - 10 reps - 5 hold - Supine 90/90 Abdominal Bracing  - 2 x daily - 7 x weekly - 1 sets - 3-5 reps - 10-15  hold - Supine 90/90 Alternating Heel Touches with Posterior Pelvic Tilt  - 2 x daily - 7 x weekly - 2 sets - 10 reps - 5 hold -cat/camel -Bridge + march    ASSESSMENT:   CLINICAL IMPRESSION:  Session focused on reducing Rt anterior hip discomfort by using reciprocal inhibition.  Manual techniques utilized to reduce upper back and neck pain, tension.  Discussed her theory that her tension is creating a lot of movement in her joints but we discussed how tension (not excessive) is necessary for some joint stability as well.  Recommended somatic therapy to work through some of her deeper issues including OCD and PTSD but she already is seeing someone for this.  Noted improvement after today's session.  Cont POC.   OBJECTIVE IMPAIRMENTS:  decreased activity tolerance, decreased strength, increased fascial restrictions, impaired flexibility, impaired UE functional use, improper body mechanics, postural dysfunction, and pain.    ACTIVITY LIMITATIONS: carrying, lifting, bending, sitting, standing, sleeping, and locomotion level   PARTICIPATION LIMITATIONS: laundry, interpersonal relationship, driving, shopping, community activity, and occupation   PERSONAL FACTORS: Past/current experiences and 3+ comorbidities: Anxiety, chronic pain, pelvic floor dysfunction  are also affecting patient's functional outcome.    REHAB POTENTIAL: Good   CLINICAL DECISION MAKING: Unstable/unpredictable   EVALUATION COMPLEXITY: High     GOALS: Goals reviewed with patient? Yes   SHORT TERM GOALS: Target date: 06/15/2022     Patient will be independent with basic home exercise program for core stability Baseline: Goal status: met   2.  Pt will be able to tolerate core routine in the clinic without increased pain Baseline: 07/05/22 able to do this today unsure about after effects  07/13/22 usually the same at times, better Goal status: met    3.  Pt will be able to report Rt hip pinching/tightness lessened to occasional and min with walking/activity.  Baseline: 07/13/22 feels tight to her , not sure if it is better or not  Goal status:ongoing      LONG TERM GOALS: Target date: 07/13/2022     Pt will be able to show I with HEP in order to progress to more challenging resistance training.  Baseline:  Goal status: ongoing    2.  Pt will be able to report no more than min increase in neck pain when toileting Baseline:  Goal status: ongoing   3.  Pt will report improved rest at night due to less neck pain, rare need to wear neck brace at night.  Baseline: 07/13/22: wears this sometimes , not as effective as she once thought Goal status: ongoing    4.  Pt will be able to report no back strain with supine core work with maintained pelvic  neutral Baseline:  Goal status: ongoing  PLAN:   PT FREQUENCY: 1-2 x/week   PT DURATION: 8 weeks   PLANNED INTERVENTIONS: Therapeutic exercises, Therapeutic activity, Neuromuscular re-education, Balance training, Patient/Family education, Self Care, Joint mobilization, Dry Needling, Electrical stimulation, Taping, Manual therapy, and Re-evaluation.   PLAN FOR NEXT SESSION: eventually Chiropractor. DN to cervicals. PNE   Core stability using soft Pilates ball, props.  Consider Pilates.  Check benefit of her SI belt  Karie Mainland, PT 07/26/22 4:41 PM Phone: [phone] Fax: [phone]

## 2022-07-26 ENCOUNTER — Ambulatory Visit: Payer: 59 | Admitting: Physical Therapy

## 2022-07-26 ENCOUNTER — Encounter: Payer: Self-pay | Admitting: Physical Therapy

## 2022-07-26 DIAGNOSIS — R252 Cramp and spasm: Secondary | ICD-10-CM

## 2022-07-26 DIAGNOSIS — M357 Hypermobility syndrome: Secondary | ICD-10-CM

## 2022-08-03 ENCOUNTER — Ambulatory Visit: Payer: 59 | Admitting: Physical Therapy

## 2022-08-03 DIAGNOSIS — M357 Hypermobility syndrome: Secondary | ICD-10-CM

## 2022-08-03 DIAGNOSIS — R252 Cramp and spasm: Secondary | ICD-10-CM

## 2022-08-03 DIAGNOSIS — R490 Dysphonia: Secondary | ICD-10-CM

## 2022-08-03 DIAGNOSIS — M545 Low back pain, unspecified: Secondary | ICD-10-CM

## 2022-08-03 DIAGNOSIS — R278 Other lack of coordination: Secondary | ICD-10-CM

## 2022-08-03 NOTE — Therapy (Signed)
OUTPATIENT PHYSICAL THERAPY TREATMENT NOTE    Patient Name: Genavie Louck MRN: [id number] DOB:04-09-1990, 32 y.o., female Today's Date: 08/03/2022  PCP: Jarold Motto PA REFERRING PROVIDER: Dr. Clementeen Graham  END OF SESSION:   PT End of Session - 08/03/22 1331     Visit Number 10    Number of Visits 18    Date for PT Re-Evaluation 08/24/22    Authorization Type [employer]    PT Start Time 1330    PT Stop Time 1415    PT Time Calculation (min) 45 min    Activity Tolerance Patient tolerated treatment well;Patient limited by pain    Behavior During Therapy WFL for tasks assessed/performed                     Past Medical History:  Diagnosis Date   Ankle injury    Anxiety    Eating disorder    Frequent headaches    Past Surgical History:  Procedure Laterality Date   ANKLE SURGERY  2018   HERNIA REPAIR     WISDOM TOOTH EXTRACTION     Patient Active Problem List   Diagnosis Date Noted   Positive ANA (antinuclear antibody) 10/19/2019   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   Anorexia nervosa, restricting type, in full remission, moderate 02/16/2018   History of depression 02/16/2018   GAD (generalized anxiety disorder) 12/16/2017   Pelvic floor dysfunction 12/16/2017   Right hip pain 11/02/2017   Grade 2 ankle sprain, right, initial encounter 05/16/2016   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   Right ankle pain 03/03/2015   IBS (irritable bowel syndrome) 05/25/2009    REFERRING DIAG: M35.7 (ICD-10-CM) - Benign hypermobility syndrome  THERAPY DIAG:  Hypermobility syndrome  Cramp and spasm  Dysphonia  Other lack of coordination  Chronic midline low back pain without sciatica  Rationale for Evaluation and Treatment Rehabilitation  PERTINENT HISTORY: see above  EDS, HSD  PRECAUTIONS: monitor lightheadedness, joint stability, pelvic pain   SUBJECTIVE:                                                                                                                                                                                       SUBJECTIVE STATEMENT: I noticed that I was still in pain with TPDN but I was able to sing and I noticed about 4 days I was able to manage pain better The Rt side of my body is really the problem.  I continue to spend hours daily getting pain relief with acupuncture mats, heat, stretching, breathing.  I feel like I could sing a bit after.  I  do have PTSD and see a mental health therapist. For the past 5 days I was not doing well at all and I had full blown symptoms of pain everywhere.   PAIN:  Are you having pain? Yes: NPRS scale: not rated today /10 Pain location: neck and shoulders  Pain description: tight Aggravating factors: squatting, sleeping, malposition  Relieving factors: neck brace, the right exercises   Lower back and hip not as much pain  but still tight    OBJECTIVE: (objective measures completed at initial evaluation unless otherwise dated)  Assessment (as on reg. Template) Beighton Scale Lumbar (_1/1) Knees (1_/2) Elbows (2_/2)  5th digit (_2/2) Thumb (_1/2)   7/9 Beighton  Comment on hips, shoulders see below      DIAGNOSTIC FINDINGS:  IMPRESSION: No significant spinal canal or neural foraminal stenosis.   PATIENT SURVEYS:  FOTO NT    SCREENING FOR RED FLAGS: Bowel or bladder incontinence: No Spinal tumors: No Cauda equina syndrome: No Compression fracture: No Abdominal aneurysm: No   COGNITION: Overall cognitive status: Within functional limits for tasks assessed                          SENSATION: WFL   MUSCLE LENGTH: Hamstrings: 70-75 passive SLR  Thomas test: NT    POSTURE: rounded shoulders, forward head, and left pelvic obliquity   PALPATION: Gross tenderness to palpation to cervical thoracic and lumbar spine.  No areas of particular hypertonicity.  Skin is not particularly stretchy or velvety.   LUMBAR ROM:     AROM eval  Flexion Palms to floor   Extension WNL min pain   Right lateral flexion WNL  Left lateral flexion WNL  Right rotation WNL  Left rotation WNL    (Blank rows = not tested)   LOWER EXTREMITY ROM:      Passive/(Active) Right eval Left eval  Hip flexion WNL, pinching  WNL  Hip extension      Hip abduction      Hip adduction      Hip internal rotation WNL not excessive  WNL, slightly more than Rt.   Hip external rotation WNL not excessive  WNL   Knee flexion WNL WNL  Knee extension WNL Hyper   Ankle dorsiflexion      Ankle plantarflexion      Ankle inversion      Ankle eversion       (Blank rows = not tested)   LOWER EXTREMITY MMT:     MMT Right eval Left eval Rt  07/13/22  Hip flexion 4+ pain  4+ NT   Hip extension       Hip abduction       Hip adduction       Hip internal rotation 4/5 pain  4+/5 5/5  Hip external rotation 4/5 pain  4+/5 5/5  Knee flexion 5/5 5/5 5/5  Knee extension 5/5 5/5 5/5  Ankle dorsiflexion       Ankle plantarflexion       Ankle inversion       Ankle eversion        (Blank rows = not tested)   LUMBAR SPECIAL TESTS:   Straight leg raise test: Negative and Single leg stance test: Negative No increased pain with air squats in lumbar spine Deeply squatting to the floor does increase neck pain which increases with time and straining  FUNCTIONAL TESTS:  NT on eval    GAIT: Distance walked: 150+  Assistive device utilized: None Level of assistance: Complete Independence Comments: NT    TODAY'S TREATMENT:                                                                                                                              DATE:  [facility] Adult PT Treatment:                                                DATE: 08-03-22 Therapeutic Exercise: Hip isometrics with PT manual Hip flexor stretch with hip flexion march from side of bed x 5 with PT CGAx 1 Supine diaphragmatic breathing Open mouth lower jaw opening to R and L to maximize  stretch post TPDN Deep neck flexor stretch Manual Therapy: STW over R masseter, R scalene/ SCM,  bil UT/ LS, bil rhomboids.  R C-3 to C-6 cervical paraspinals. Also guided stretching after TPDN of specific muscles Myofascial of scalenes Trigger Point Dry-Needling performed     by Garen Lah Treatment instructions: Expect mild to moderate muscle soreness. S/S of pneumothorax if dry needled over a lung field, and to seek immediate medical attention should they occur. Patient verbalized understanding of these instructions and education.  Patient Consent Given: Yes Education handout provided: Previously provided Muscles treated:   R Iliacus R SCM R scalene, bil UT/ LS, bil rhomboids, R masseter,  R pterygoid Electrical stimulation performed: No Parameters: N/A Treatment response/outcome: twitch response noted, pt noted relief  Modalities: Moist hot pack to cervical and R masseter   [facility] Adult PT Treatment:                                                DATE: 07/26/22 Therapeutic Exercise: Hip isometrics with PT manual  Reciprocal inhibition : black band Arcs (extension) and knee /hip extension x 10 each  Hamstring bridge: double and single leg bridge alt. Used 8 inch step Glute bridge calves on step x 10  Manual Therapy: PROM Rt hip Supine cervical soft tissue work to multiple trigger points in periscapular and posterior cervicals, levator scapula.  Self Care: Relaxation, working through solutions and self care pain strategies  Soft tissue self massage, use of balls/roller   [facility] Adult PT Treatment:                                                DATE: 07-22-22 Therapeutic Exercise: Supine diaphragmatic breathing Quadriped thread the needle stretch Open mouth lower jaw opening to R and L to maximize stretch post TPDN Deep neck flexor stretch Childs pose Manual  Therapy: STW over R masseter, R scalene/ SCM,  bil UT/ LS, bil rhomboids.  R C-3 to C-6 cervical  paraspinals. Myofascial of scalenes Trigger Point Dry-Needling performed     by Garen Lah Treatment instructions: Expect mild to moderate muscle soreness. S/S of pneumothorax if dry needled over a lung field, and to seek immediate medical attention should they occur. Patient verbalized understanding of these instructions and education.  Patient Consent Given: Yes Education handout provided: Previously provided Muscles treated:  R SCM R scalene, bil UT/ LS, bil rhomboids Electrical stimulation performed: No Parameters: N/A Treatment response/outcome: twitch response noted, pt noted relief Modality Moist Hot pack  after TPDN   [facility] Adult PT Treatment:                                                DATE: 07/19/22 Therapeutic Exercise: Supine breathing, LE activation with foam roller  Hip flexor stretch, active with horizontal foam roller  Bridge LE's on bolster Mod Q-ped clam and then hip extension  Prone scapular retraction Prone goal post with ER x 10 then Lift off table x 10 Prone upper back/cobra x 10  Prone TrA with alternating knee bent  Prone hip extension x 10    [facility] Adult PT Treatment:                                                DATE: 07/13/22 Therapeutic Exercise: Gentle trunk rotation Ant/post tilt on soft ball Supine arm arcs overhead and T unilateral Bilateral goal post  Supine cat and cow  Lower abdominals 90/90 hold added alt knee extension x 10  Horizontal abd yellow band  Standing on wall isometric hold bilateral 3 x 30 sec  Cervical nods used towel around neck, small ROM  Lateral flexion and rotation small ROM with towel  Self Care: Plan of care, neck tension with normal activities    [facility] Adult PT Treatment:                                                DATE: 07/05/22 Therapeutic Exercise: Supine head turns and chin tuck  Supine arm arcs  x 10 ball under pelvis Small ROM double leg fall out pelvis on ball  Bridge with ball x 10 cues for lumbar   Bridge with Pilates circle x 10 SLR with Pilates circle on opposite thigh x 10  Bird dog UE and LE x 5 each then combo x 8 each side  Bear plank 2 x 30 sec   [facility] Adult PT Treatment:                                                DATE: 06/29/22 Therapeutic Exercise: Footwork  Double leg Parallel heels, toes narrow and wide Pilates V heels and toes narrow and wide  2 red 1 blue  Single leg  Heel in parallel and turnout 2 red 1 blue  Double leg heel raise  and calf stretch, prancing  Bridging 2 red 1 blue x 10 mod cues for control and articulation   Supine Arm Arcs, 1 red 1 yellow x 10  Self Care: Calming the ANS, book for reference ([employer])  Neck tension, core and Pilates concepts    [facility] Adult PT Treatment:                                                DATE: 06/10/22 Manual Therapy: Kinesiotape, placed 1 "I" strip on each Upper trap  Neuromuscular re-ed: Sidelying head on soft ball to reduce neck tension Single arm reverse fly 3 lbs x 10, band red) x 10  Supine pelvic neutral  Tr A x 10  Unilateral clam  Bilateral clam  Used ball under feet and under pelvis for feedback Self Care: Pelvic PT vs EDS PT Over thinking and over recruiting muscles  Less is more mentality to avoid burning out     [facility] Adult PT Treatment:                                                DATE: 05/24/22 Therapeutic Exercise: Cervical rotation  Ball under head/neck cervical retraction x 10  Looped Red band overhead lift and chest press x 10 each  Horizontal bands red, green and blue x 10  Neutral spine, A/P tilting with breathing coordination  Tr A x 10 with  and without ball under hips  Dead bug UE x 10 then LE x 10, ball under pelvic 90/90 hold with breathing x 30 sec x 3  90/90 toe taps x 10   Self Care: Muscles of core, Tr A vs Rectus Ab contraction, breathing and principles, hip flexor  05/18/22  PT eval, Self care    PATIENT EDUCATION:  Education details: PT, POC, core  strength and stability , SI belt and soft collar OK when needed  Person educated: Patient Education method: Explanation and Demonstration Education comprehension: verbalized understanding   HOME EXERCISE PROGRAM: Access Code: [email or web address] URL: [email or web address] Date: 05/24/2022 Prepared by: Karie Mainland  Exercises - Seated Chin Tuck with Neck Elongation  - 2 x daily - 7 x weekly - 2 sets - 10 reps - 5 hold - Standing Shoulder Horizontal Abduction with Resistance  - 2 x daily - 7 x weekly - 2 sets - 10 reps - 5 hold - Hooklying Transversus Abdominis Palpation  - 2 x daily - 7 x weekly - 2 sets - 10 reps - 5 hold - Supine 90/90 Abdominal Bracing  - 2 x daily - 7 x weekly - 1 sets - 3-5 reps - 10-15  hold - Supine 90/90 Alternating Heel Touches with Posterior Pelvic Tilt  - 2 x daily - 7 x weekly - 2 sets - 10 reps - 5 hold -cat/camel -Bridge + march    ASSESSMENT:   CLINICAL IMPRESSION:  Session focused on reducing Rt anterior hip discomfort  with TPDN and   Manual techniques utilized to reduce upper back and neck and jaw pain, tension. . Pt is closely monitored and brings list of specific muscles she has concerns about including scalenes,TMJ masseter, Iliacus today. Pt was closely monitored throughout session and ended with decreased muscle  tension after manual and gentle movement and exercise.  Will continue to work with primary PT for maximizing strength and decreasing pain with motion. No adverse reactions and no further questions at end of session. Left patient with moist heat pack in darkened room with deep breathing and total relaxation.  Cont POC.   OBJECTIVE IMPAIRMENTS: decreased activity tolerance, decreased strength, increased fascial restrictions, impaired flexibility, impaired UE functional use, improper body mechanics, postural dysfunction, and pain.    ACTIVITY LIMITATIONS: carrying, lifting, bending, sitting, standing, sleeping, and locomotion level    PARTICIPATION LIMITATIONS: laundry, interpersonal relationship, driving, shopping, community activity, and occupation   PERSONAL FACTORS: Past/current experiences and 3+ comorbidities: Anxiety, chronic pain, pelvic floor dysfunction  are also affecting patient's functional outcome.    REHAB POTENTIAL: Good   CLINICAL DECISION MAKING: Unstable/unpredictable   EVALUATION COMPLEXITY: High     GOALS: Goals reviewed with patient? Yes   SHORT TERM GOALS: Target date: 06/15/2022     Patient will be independent with basic home exercise program for core stability Baseline: Goal status: met   2.  Pt will be able to tolerate core routine in the clinic without increased pain Baseline: 07/05/22 able to do this today unsure about after effects  07/13/22 usually the same at times, better Goal status: met    3.  Pt will be able to report Rt hip pinching/tightness lessened to occasional and min with walking/activity.  Baseline: 07/13/22 feels tight to her , not sure if it is better or not  Goal status:ongoing      LONG TERM GOALS: Target date: 07/13/2022     Pt will be able to show I with HEP in order to progress to more challenging resistance training.  Baseline:  Goal status: ongoing    2.  Pt will be able to report no more than min increase in neck pain when toileting Baseline:  Goal status: ongoing   3.  Pt will report improved rest at night due to less neck pain, rare need to wear neck brace at night.  Baseline: 07/13/22: wears this sometimes , not as effective as she once thought Goal status: ongoing    4.  Pt will be able to report no back strain with supine core work with maintained pelvic neutral Baseline:  Goal status: ongoing    PLAN:   PT FREQUENCY: 1-2 x/week   PT DURATION: 8 weeks   PLANNED INTERVENTIONS: Therapeutic exercises, Therapeutic activity, Neuromuscular re-education, Balance training, Patient/Family education, Self Care, Joint mobilization, Dry Needling,  Electrical stimulation, Taping, Manual therapy, and Re-evaluation.   PLAN FOR NEXT SESSION: eventually Chiropractor. DN to cervicals. PNE   Core stability using soft Pilates ball, props.  Consider Pilates.  Check benefit of her SI belt  Garen Lah, PT, [facility] Certified Exercise Expert for the Aging Adult  08/03/22 2:39 PM Phone: [phone] Fax: [phone]

## 2022-08-06 ENCOUNTER — Encounter: Payer: Self-pay | Admitting: Physical Therapy

## 2022-08-06 ENCOUNTER — Ambulatory Visit: Payer: 59 | Attending: Physician Assistant | Admitting: Physical Therapy

## 2022-08-06 DIAGNOSIS — R252 Cramp and spasm: Secondary | ICD-10-CM | POA: Insufficient documentation

## 2022-08-06 DIAGNOSIS — R490 Dysphonia: Secondary | ICD-10-CM | POA: Insufficient documentation

## 2022-08-06 DIAGNOSIS — G8929 Other chronic pain: Secondary | ICD-10-CM | POA: Insufficient documentation

## 2022-08-06 DIAGNOSIS — M545 Low back pain, unspecified: Secondary | ICD-10-CM | POA: Diagnosis present

## 2022-08-06 DIAGNOSIS — M357 Hypermobility syndrome: Secondary | ICD-10-CM | POA: Insufficient documentation

## 2022-08-06 DIAGNOSIS — R278 Other lack of coordination: Secondary | ICD-10-CM | POA: Diagnosis present

## 2022-08-06 NOTE — Therapy (Signed)
OUTPATIENT PHYSICAL THERAPY TREATMENT NOTE    Patient Name: Brittany [name] MRN: [id number] DOB:06-20-1990, 32 y.o., female Today's Date: 08/06/2022  PCP: Jarold Motto PA REFERRING PROVIDER: Dr. Clementeen Graham  END OF SESSION:   PT End of Session - 08/06/22 1116     Visit Number 11    Number of Visits 18    Date for PT Re-Evaluation 08/24/22    Authorization Type [employer]    PT Start Time 1116    PT Stop Time 1147    PT Time Calculation (min) 31 min    Activity Tolerance Patient tolerated treatment well;Patient limited by pain    Behavior During Therapy WFL for tasks assessed/performed                      Past Medical History:  Diagnosis Date   Ankle injury    Anxiety    Eating disorder    Frequent headaches    Past Surgical History:  Procedure Laterality Date   ANKLE SURGERY  2018   HERNIA REPAIR     WISDOM TOOTH EXTRACTION     Patient Active Problem List   Diagnosis Date Noted   Positive ANA (antinuclear antibody) 10/19/2019   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   Anorexia nervosa, restricting type, in full remission, moderate 02/16/2018   History of depression 02/16/2018   GAD (generalized anxiety disorder) 12/16/2017   Pelvic floor dysfunction 12/16/2017   Right hip pain 11/02/2017   Grade 2 ankle sprain, right, initial encounter 05/16/2016   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   Right ankle pain 03/03/2015   IBS (irritable bowel syndrome) 05/25/2009    REFERRING DIAG: M35.7 (ICD-10-CM) - Benign hypermobility syndrome  THERAPY DIAG:  Hypermobility syndrome  Cramp and spasm  Rationale for Evaluation and Treatment Rehabilitation  PERTINENT HISTORY: see above  EDS, HSD  PRECAUTIONS: monitor lightheadedness, joint stability, pelvic pain   SUBJECTIVE:                                                                                                                                                                                       SUBJECTIVE STATEMENT: Arrived 14 min late.  Did not fully assess pain due to time constraints. Reports no change in symptoms after the last session. During session, pt was asked how we will know when she is "better", and this was her reply: "When I am not constantly in pain.  When I can use muscles without excessive tension.  When I can use my arms without fear or subluxing the joint" Addendum: 08/09/22 patient wanted PT to know that she wanted to  be able to use all parts of her body without pain or fear regarding injury   PAIN:  Are you having pain? Yes: NPRS scale: not rated today /10 Pain location: neck and shoulders  Pain description: tight Aggravating factors: squatting, sleeping, malposition  Relieving factors: neck brace, the right exercises   Lower back and hip not as much pain  but still tight    OBJECTIVE: (objective measures completed at initial evaluation unless otherwise dated)  Assessment (as on reg. Template) Beighton Scale Lumbar (_1/1) Knees (1_/2) Elbows (2_/2)  5th digit (_2/2) Thumb (_1/2)   7/9 Beighton  Comment on hips, shoulders see below      DIAGNOSTIC FINDINGS:  IMPRESSION: No significant spinal canal or neural foraminal stenosis.   PATIENT SURVEYS:  FOTO NT    SCREENING FOR RED FLAGS: Bowel or bladder incontinence: No Spinal tumors: No Cauda equina syndrome: No Compression fracture: No Abdominal aneurysm: No   COGNITION: Overall cognitive status: Within functional limits for tasks assessed                          SENSATION: WFL   MUSCLE LENGTH: Hamstrings: 70-75 passive SLR  Thomas test: NT    POSTURE: rounded shoulders, forward head, and left pelvic obliquity   PALPATION: Gross tenderness to palpation to cervical thoracic and lumbar spine.  No areas of particular hypertonicity.  Skin is not particularly stretchy or velvety.   LUMBAR ROM:    AROM eval  Flexion Palms to floor   Extension WNL  min pain   Right lateral flexion WNL  Left lateral flexion WNL  Right rotation WNL  Left rotation WNL    (Blank rows = not tested)   LOWER EXTREMITY ROM:      Passive/(Active) Right eval Left eval  Hip flexion WNL, pinching  WNL  Hip extension      Hip abduction      Hip adduction      Hip internal rotation WNL not excessive  WNL, slightly more than Rt.   Hip external rotation WNL not excessive  WNL   Knee flexion WNL WNL  Knee extension WNL Hyper   Ankle dorsiflexion      Ankle plantarflexion      Ankle inversion      Ankle eversion       (Blank rows = not tested)   LOWER EXTREMITY MMT:     MMT Right eval Left eval Rt  07/13/22  Hip flexion 4+ pain  4+ NT   Hip extension       Hip abduction       Hip adduction       Hip internal rotation 4/5 pain  4+/5 5/5  Hip external rotation 4/5 pain  4+/5 5/5  Knee flexion 5/5 5/5 5/5  Knee extension 5/5 5/5 5/5  Ankle dorsiflexion       Ankle plantarflexion       Ankle inversion       Ankle eversion        (Blank rows = not tested)   LUMBAR SPECIAL TESTS:   Straight leg raise test: Negative and Single leg stance test: Negative No increased pain with air squats in lumbar spine Deeply squatting to the floor does increase neck pain which increases with time and straining  FUNCTIONAL TESTS:  NT on eval    GAIT: Distance walked: 150+ Assistive device utilized: None Level of assistance: Complete Independence Comments: NT    TODAY'S  TREATMENT:                                                                                                                              DATE:   [facility] Adult PT Treatment:                                                DATE: 08/06/22 Therapeutic Exercise: Supine gentle mobility in shoulders overhead  Arm arcs  Open/close in bridge with ball between knees 90/90 hold with ball between knees,hands press to thighs 30 sec Arm arcs in 90/90 x 10  90/90 knee extension with ball , alt and then double  leg Bridge with physio ball x 10, 2 sets  Oblique twist with ball x 10  Prone, standing with trunk on ball (plank) for UE series: extension , reverse fly and V x 15 each  Opp arm/opp leg lift on ball  High plank with hip extension x 5 added UE lift x 5 each    [facility] Adult PT Treatment:                                                DATE: 08-03-22 Therapeutic Exercise: Hip isometrics with PT manual Hip flexor stretch with hip flexion march from side of bed x 5 with PT CGAx 1 Supine diaphragmatic breathing Open mouth lower jaw opening to R and L to maximize stretch post TPDN Deep neck flexor stretch Manual Therapy: STW over R masseter, R scalene/ SCM,  bil UT/ LS, bil rhomboids.  R C-3 to C-6 cervical paraspinals. Also guided stretching after TPDN of specific muscles Myofascial of scalenes Trigger Point Dry-Needling performed     by Garen Lah Treatment instructions: Expect mild to moderate muscle soreness. S/S of pneumothorax if dry needled over a lung field, and to seek immediate medical attention should they occur. Patient verbalized understanding of these instructions and education. Patient Consent Given: Yes Education handout provided: Previously provided Muscles treated:   R Iliacus R SCM R scalene, bil UT/ LS, bil rhomboids, R masseter,  R pterygoid Electrical stimulation performed: No Parameters: N/A Treatment response/outcome: twitch response noted, pt noted relief  Modalities: Moist hot pack to cervical and R masseter   [facility] Adult PT Treatment:                                                DATE: 07/26/22 Therapeutic Exercise: Hip isometrics with PT manual  Reciprocal inhibition : black band Arcs (extension) and knee /hip extension x 10 each  Hamstring bridge:  double and single leg bridge alt. Used 8 inch step Glute bridge calves on step x 10  Manual Therapy: PROM Rt hip Supine cervical soft tissue work to multiple trigger points in periscapular and posterior  cervicals, levator scapula.  Self Care: Relaxation, working through solutions and self care pain strategies  Soft tissue self massage, use of balls/roller   [facility] Adult PT Treatment:                                                DATE: 07-22-22 Therapeutic Exercise: Supine diaphragmatic breathing Quadriped thread the needle stretch Open mouth lower jaw opening to R and L to maximize stretch post TPDN Deep neck flexor stretch Childs pose Manual Therapy: STW over R masseter, R scalene/ SCM,  bil UT/ LS, bil rhomboids.  R C-3 to C-6 cervical paraspinals. Myofascial of scalenes Trigger Point Dry-Needling performed     by Garen Lah Treatment instructions: Expect mild to moderate muscle soreness. S/S of pneumothorax if dry needled over a lung field, and to seek immediate medical attention should they occur. Patient verbalized understanding of these instructions and education.  Patient Consent Given: Yes Education handout provided: Previously provided Muscles treated:  R SCM R scalene, bil UT/ LS, bil rhomboids Electrical stimulation performed: No Parameters: N/A Treatment response/outcome: twitch response noted, pt noted relief Modality Moist Hot pack  after TPDN   [facility] Adult PT Treatment:                                                DATE: 07/19/22 Therapeutic Exercise: Supine breathing, LE activation with foam roller  Hip flexor stretch, active with horizontal foam roller  Bridge LE's on bolster Mod Q-ped clam and then hip extension  Prone scapular retraction Prone goal post with ER x 10 then Lift off table x 10 Prone upper back/cobra x 10  Prone TrA with alternating knee bent  Prone hip extension x 10    PATIENT EDUCATION:  Education details: PT, POC, core strength and stability , SI belt and soft collar OK when needed  Person educated: Patient Education method: Explanation and Demonstration Education comprehension: verbalized understanding   HOME EXERCISE  PROGRAM: Access Code: [email or web address] URL: [email or web address] Date: 05/24/2022 Prepared by: Karie Mainland  Exercises - Seated Chin Tuck with Neck Elongation  - 2 x daily - 7 x weekly - 2 sets - 10 reps - 5 hold - Standing Shoulder Horizontal Abduction with Resistance  - 2 x daily - 7 x weekly - 2 sets - 10 reps - 5 hold - Hooklying Transversus Abdominis Palpation  - 2 x daily - 7 x weekly - 2 sets - 10 reps - 5 hold - Supine 90/90 Abdominal Bracing  - 2 x daily - 7 x weekly - 1 sets - 3-5 reps - 10-15  hold - Supine 90/90 Alternating Heel Touches with Posterior Pelvic Tilt  - 2 x daily - 7 x weekly - 2 sets - 10 reps - 5 hold -cat/camel -Bridge + march    ASSESSMENT:   CLINICAL IMPRESSION: Patient arrived late to session focused on calming the nervous system then stabilizing and integrating UE and LE into exercises.  Patient showed excellent technique with all exercises performed  in supine and in the high plank position.  She has excellent body awareness and good muscle strength.  The issue is that her brain perceives the tension and excessive and at times this may be true.  Patient continues to feel lack of confidence with activities outside of the clinic.  She is fearful that she will have increased pain, a flareup, dislocation or even an injury.  Patient will continue to benefit from skilled physical therapy focused mostly on exercises to build on the confidence and create controlled mobility and stabilization.  Patient reports feeling good at the end of the session and despite shortened time was able to get a good amount of work in.  OBJECTIVE IMPAIRMENTS: decreased activity tolerance, decreased strength, increased fascial restrictions, impaired flexibility, impaired UE functional use, improper body mechanics, postural dysfunction, and pain.    ACTIVITY LIMITATIONS: carrying, lifting, bending, sitting, standing, sleeping, and locomotion level   PARTICIPATION LIMITATIONS: laundry,  interpersonal relationship, driving, shopping, community activity, and occupation   PERSONAL FACTORS: Past/current experiences and 3+ comorbidities: Anxiety, chronic pain, pelvic floor dysfunction  are also affecting patient's functional outcome.    REHAB POTENTIAL: Good   CLINICAL DECISION MAKING: Unstable/unpredictable   EVALUATION COMPLEXITY: High     GOALS: Goals reviewed with patient? Yes   SHORT TERM GOALS: Target date: 06/15/2022     Patient will be independent with basic home exercise program for core stability Baseline: Goal status: met   2.  Pt will be able to tolerate core routine in the clinic without increased pain Baseline: 07/05/22 able to do this today unsure about after effects  07/13/22 usually the same at times, better Goal status: met    3.  Pt will be able to report Rt hip pinching/tightness lessened to occasional and min with walking/activity.  Baseline: 07/13/22 feels tight to her , not sure if it is better or not  Goal status:ongoing      LONG TERM GOALS: Target date: 07/13/2022     Pt will be able to show I with HEP in order to progress to more challenging resistance training.  Baseline:  Goal status: ongoing    2.  Pt will be able to report no more than min increase in neck pain when toileting Baseline:  Goal status: ongoing   3.  Pt will report improved rest at night due to less neck pain, rare need to wear neck brace at night.  Baseline: 07/13/22: wears this sometimes , not as effective as she once thought Goal status: ongoing    4.  Pt will be able to report no back strain with supine core work with maintained pelvic neutral Baseline:  Goal status: ongoing    PLAN:   PT FREQUENCY: 1-2 x/week   PT DURATION: 8 weeks   PLANNED INTERVENTIONS: Therapeutic exercises, Therapeutic activity, Neuromuscular re-education, Balance training, Patient/Family education, Self Care, Joint mobilization, Dry Needling, Electrical stimulation, Taping, Manual  therapy, and Re-evaluation.   PLAN FOR NEXT SESSION: eventually Chiropractor. DN to cervicals. PNE   Core stability using soft Pilates ball, props.  Consider Pilates.  Check benefit of her SI belt  Karie Mainland, PT 08/06/22 11:55 AM Phone: [phone] Fax: [phone]

## 2022-08-08 NOTE — Therapy (Unsigned)
OUTPATIENT PHYSICAL THERAPY TREATMENT NOTE    Patient Name: Brittany [name] MRN: [id number] DOB:13-Jul-1990, 32 y.o., female Today's Date: 08/09/2022  PCP: Jarold Motto PA REFERRING PROVIDER: Dr. Clementeen Graham  END OF SESSION:   PT End of Session - 08/09/22 1502     Visit Number 12    Number of Visits 18    Date for PT Re-Evaluation 08/24/22    Authorization Type [employer]    PT Start Time 1500    PT Stop Time 1545    PT Time Calculation (min) 45 min    Activity Tolerance Patient tolerated treatment well;Patient limited by pain    Behavior During Therapy WFL for tasks assessed/performed                       Past Medical History:  Diagnosis Date   Ankle injury    Anxiety    Eating disorder    Frequent headaches    Past Surgical History:  Procedure Laterality Date   ANKLE SURGERY  2018   HERNIA REPAIR     WISDOM TOOTH EXTRACTION     Patient Active Problem List   Diagnosis Date Noted   Positive ANA (antinuclear antibody) 10/19/2019   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   Anorexia nervosa, restricting type, in full remission, moderate 02/16/2018   History of depression 02/16/2018   GAD (generalized anxiety disorder) 12/16/2017   Pelvic floor dysfunction 12/16/2017   Right hip pain 11/02/2017   Grade 2 ankle sprain, right, initial encounter 05/16/2016   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   Right ankle pain 03/03/2015   IBS (irritable bowel syndrome) 05/25/2009    REFERRING DIAG: M35.7 (ICD-10-CM) - Benign hypermobility syndrome  THERAPY DIAG:  Hypermobility syndrome  Cramp and spasm  Rationale for Evaluation and Treatment Rehabilitation  PERTINENT HISTORY: see above  EDS, HSD  PRECAUTIONS: monitor lightheadedness, joint stability, pelvic pain   SUBJECTIVE:                                                                                                                                                                                       SUBJECTIVE STATEMENT: Every morning before I get up I have a routine that I do .  I don't hurt as bad today as usual.  Pt with questions regarding the exercises we did last time.     PAIN:  Are you having pain? Yes: NPRS scale: not rated today /10 Pain location: neck and shoulders  Pain description: tight Aggravating factors: squatting, sleeping, malposition  Relieving factors: neck brace, the right exercises   Lower back and hip not  as much pain  but still tight    OBJECTIVE: (objective measures completed at initial evaluation unless otherwise dated)  Assessment (as on reg. Template) Beighton Scale Lumbar (_1/1) Knees (1_/2) Elbows (2_/2)  5th digit (_2/2) Thumb (_1/2)   7/9 Beighton  Comment on hips, shoulders see below      DIAGNOSTIC FINDINGS:  IMPRESSION: No significant spinal canal or neural foraminal stenosis.   PATIENT SURVEYS:  FOTO NT    SCREENING FOR RED FLAGS: Bowel or bladder incontinence: No Spinal tumors: No Cauda equina syndrome: No Compression fracture: No Abdominal aneurysm: No   COGNITION: Overall cognitive status: Within functional limits for tasks assessed                          SENSATION: WFL   MUSCLE LENGTH: Hamstrings: 70-75 passive SLR  Thomas test: NT    POSTURE: rounded shoulders, forward head, and left pelvic obliquity   PALPATION: Gross tenderness to palpation to cervical thoracic and lumbar spine.  No areas of particular hypertonicity.  Skin is not particularly stretchy or velvety.   LUMBAR ROM:    AROM eval  Flexion Palms to floor   Extension WNL min pain   Right lateral flexion WNL  Left lateral flexion WNL  Right rotation WNL  Left rotation WNL    (Blank rows = not tested)   LOWER EXTREMITY ROM:      Passive/(Active) Right eval Left eval  Hip flexion WNL, pinching  WNL  Hip extension      Hip abduction      Hip adduction      Hip internal rotation WNL not excessive   WNL, slightly more than Rt.   Hip external rotation WNL not excessive  WNL   Knee flexion WNL WNL  Knee extension WNL Hyper   Ankle dorsiflexion      Ankle plantarflexion      Ankle inversion      Ankle eversion       (Blank rows = not tested)   LOWER EXTREMITY MMT:     MMT Right eval Left eval Rt  07/13/22  Hip flexion 4+ pain  4+ NT   Hip extension       Hip abduction       Hip adduction       Hip internal rotation 4/5 pain  4+/5 5/5  Hip external rotation 4/5 pain  4+/5 5/5  Knee flexion 5/5 5/5 5/5  Knee extension 5/5 5/5 5/5  Ankle dorsiflexion       Ankle plantarflexion       Ankle inversion       Ankle eversion        (Blank rows = not tested)   LUMBAR SPECIAL TESTS:   Straight leg raise test: Negative and Single leg stance test: Negative No increased pain with air squats in lumbar spine Deeply squatting to the floor does increase neck pain which increases with time and straining  FUNCTIONAL TESTS:  NT on eval    GAIT: Distance walked: 150+ Assistive device utilized: None Level of assistance: Complete Independence Comments: NT    TODAY'S TREATMENT:  [facility]                                                DATE: 08/09/22 Therapeutic Exercise: Cat and camel x 10  Childs pose -relieves all pain  Bird dog  x 5 each side  Sit to stand x 10 lbs KB press (chest) Squat 10 lbs x 10  Dead lift 10 lbs  x 10  Single leg hip hinge with foot on pad  x 10 each , intermittent UE assist  Standing hip circles on Airex pad  Palloff rotation 1/2 kneeling (away from anchor) x 10 used blue band  Single arm cross body reach green band x 10  TRX Hinge to high knee x 10  Sidelying Rt QL, hip flexor on waist pillow  Manual Therapy: Light manual to Rt trunk in sidelying stretch position.  Quadratus Lumborum and obliques, iliacus    Prone soft tissue work to lumbar paraspinals Hip PROM with compression to glute (pin and stretch)   [facility] Adult PT Treatment:                                                DATE: 08/06/22 Therapeutic Exercise: Supine gentle mobility in shoulders overhead  Arm arcs  Open/close in bridge with ball between knees 90/90 hold with ball between knees,hands press to thighs 30 sec Arm arcs in 90/90 x 10  90/90 knee extension with ball , alt and then double leg Bridge with physio ball x 10, 2 sets  Oblique twist with ball x 10  Prone, standing with trunk on ball (plank) for UE series: extension , reverse fly and V x 15 each  Opp arm/opp leg lift on ball  High plank with hip extension x 5 added UE lift x 5 each    [facility] Adult PT Treatment:                                                DATE: 08-03-22 Therapeutic Exercise: Hip isometrics with PT manual Hip flexor stretch with hip flexion march from side of bed x 5 with PT CGAx 1 Supine diaphragmatic breathing Open mouth lower jaw opening to R and L to maximize stretch post TPDN Deep neck flexor stretch Manual Therapy: STW over R masseter, R scalene/ SCM,  bil UT/ LS, bil rhomboids.  R C-3 to C-6 cervical paraspinals. Also guided stretching after TPDN of specific muscles Myofascial of scalenes Trigger Point Dry-Needling performed     by Garen Lah Treatment instructions: Expect mild to moderate muscle soreness. S/S of pneumothorax if dry needled over a lung field, and to seek immediate medical attention should they occur. Patient verbalized understanding of these instructions and education. Patient Consent Given: Yes Education handout provided: Previously provided Muscles treated:   R Iliacus R SCM R scalene, bil UT/ LS, bil rhomboids, R masseter,  R pterygoid Electrical stimulation performed: No Parameters: N/A Treatment response/outcome: twitch response noted, pt noted relief  Modalities: Moist hot pack to cervical and R  masseter   [facility] Adult PT Treatment:  DATE: 07/26/22 Therapeutic Exercise: Hip isometrics with PT manual  Reciprocal inhibition : black band Arcs (extension) and knee /hip extension x 10 each  Hamstring bridge: double and single leg bridge alt. Used 8 inch step Glute bridge calves on step x 10  Manual Therapy: PROM Rt hip Supine cervical soft tissue work to multiple trigger points in periscapular and posterior cervicals, levator scapula.  Self Care: Relaxation, working through solutions and self care pain strategies  Soft tissue self massage, use of balls/roller   [facility] Adult PT Treatment:                                                DATE: 07-22-22 Therapeutic Exercise: Supine diaphragmatic breathing Quadriped thread the needle stretch Open mouth lower jaw opening to R and L to maximize stretch post TPDN Deep neck flexor stretch Childs pose Manual Therapy: STW over R masseter, R scalene/ SCM,  bil UT/ LS, bil rhomboids.  R C-3 to C-6 cervical paraspinals. Myofascial of scalenes Trigger Point Dry-Needling performed     by Garen Lah Treatment instructions: Expect mild to moderate muscle soreness. S/S of pneumothorax if dry needled over a lung field, and to seek immediate medical attention should they occur. Patient verbalized understanding of these instructions and education.  Patient Consent Given: Yes Education handout provided: Previously provided Muscles treated:  R SCM R scalene, bil UT/ LS, bil rhomboids Electrical stimulation performed: No Parameters: N/A Treatment response/outcome: twitch response noted, pt noted relief Modality Moist Hot pack  after TPDN   [facility] Adult PT Treatment:                                                DATE: 07/19/22 Therapeutic Exercise: Supine breathing, LE activation with foam roller  Hip flexor stretch, active with horizontal foam roller  Bridge LE's on bolster Mod Q-ped clam and then hip  extension  Prone scapular retraction Prone goal post with ER x 10 then Lift off table x 10 Prone upper back/cobra x 10  Prone TrA with alternating knee bent  Prone hip extension x 10    PATIENT EDUCATION:  Education details: PT, POC, core strength and stability , SI belt and soft collar OK when needed  Person educated: Patient Education method: Explanation and Demonstration Education comprehension: verbalized understanding   HOME EXERCISE PROGRAM: Access Code: [email or web address] URL: [email or web address] Date: 05/24/2022 Prepared by: Karie Mainland  Exercises - Seated Chin Tuck with Neck Elongation  - 2 x daily - 7 x weekly - 2 sets - 10 reps - 5 hold - Standing Shoulder Horizontal Abduction with Resistance  - 2 x daily - 7 x weekly - 2 sets - 10 reps - 5 hold - Hooklying Transversus Abdominis Palpation  - 2 x daily - 7 x weekly - 2 sets - 10 reps - 5 hold - Supine 90/90 Abdominal Bracing  - 2 x daily - 7 x weekly - 1 sets - 3-5 reps - 10-15  hold - Supine 90/90 Alternating Heel Touches with Posterior Pelvic Tilt  - 2 x daily - 7 x weekly - 2 sets - 10 reps - 5 hold -cat/camel -Bridge + march    ASSESSMENT:   CLINICAL IMPRESSION: Patient was able  to perform functional exercises today and PT was able to visualize more dynamic movements which proved helpful.  She found it a little more challenging to balance on her right leg due to right hip flexor and show decreased depth with hinging on the right side.  Form with squatting and dead lifting was near perfect.  Right quadratus lumborum was in spasm, showed her how to deactivate, inhibit that area in side-lying with a pillow.  She reports that she had always had difficulty with that side even in high school she has had pain and dysfunction whereby she could not run.  She will continue to benefit from functional exercise and stabilization in order to carryover concepts at work while using upper body to squat, push etc.    OBJECTIVE  IMPAIRMENTS: decreased activity tolerance, decreased strength, increased fascial restrictions, impaired flexibility, impaired UE functional use, improper body mechanics, postural dysfunction, and pain.    ACTIVITY LIMITATIONS: carrying, lifting, bending, sitting, standing, sleeping, and locomotion level   PARTICIPATION LIMITATIONS: laundry, interpersonal relationship, driving, shopping, community activity, and occupation   PERSONAL FACTORS: Past/current experiences and 3+ comorbidities: Anxiety, chronic pain, pelvic floor dysfunction  are also affecting patient's functional outcome.    REHAB POTENTIAL: Good   CLINICAL DECISION MAKING: Unstable/unpredictable   EVALUATION COMPLEXITY: High     GOALS: Goals reviewed with patient? Yes   SHORT TERM GOALS: Target date: 06/15/2022     Patient will be independent with basic home exercise program for core stability Baseline: Goal status: met   2.  Pt will be able to tolerate core routine in the clinic without increased pain Baseline: 07/05/22 able to do this today unsure about after effects  07/13/22 usually the same at times, better Goal status: met    3.  Pt will be able to report Rt hip pinching/tightness lessened to occasional and min with walking/activity.  Baseline: 07/13/22 feels tight to her , not sure if it is better or not  Goal status:ongoing      LONG TERM GOALS: Target date: 07/13/2022     Pt will be able to show I with HEP in order to progress to more challenging resistance training.  Baseline:  Goal status: ongoing    2.  Pt will be able to report no more than min increase in neck pain when toileting Baseline:  Goal status: ongoing   3.  Pt will report improved rest at night due to less neck pain, rare need to wear neck brace at night.  Baseline: 07/13/22: wears this sometimes , not as effective as she once thought Goal status: ongoing    4.  Pt will be able to report no back strain with supine core work with maintained  pelvic neutral Baseline:  Goal status: ongoing    PLAN:   PT FREQUENCY: 1-2 x/week   PT DURATION: 8 weeks   PLANNED INTERVENTIONS: Therapeutic exercises, Therapeutic activity, Neuromuscular re-education, Balance training, Patient/Family education, Self Care, Joint mobilization, Dry Needling, Electrical stimulation, Taping, Manual therapy, and Re-evaluation.   PLAN FOR NEXT SESSION: Dry needling to right QL?  Functional training,  core stability using soft Pilates ball, props.  Practice lifting without straining neck and upper traps  Karie Mainland, PT 08/09/22 3:03 PM Phone: [phone] Fax: [phone]

## 2022-08-09 ENCOUNTER — Ambulatory Visit: Payer: 59 | Admitting: Physical Therapy

## 2022-08-09 ENCOUNTER — Encounter: Payer: Self-pay | Admitting: Physical Therapy

## 2022-08-09 DIAGNOSIS — R252 Cramp and spasm: Secondary | ICD-10-CM

## 2022-08-09 DIAGNOSIS — M357 Hypermobility syndrome: Secondary | ICD-10-CM | POA: Diagnosis not present

## 2022-08-12 ENCOUNTER — Ambulatory Visit: Payer: 59 | Admitting: Physical Therapy

## 2022-08-17 ENCOUNTER — Ambulatory Visit: Payer: 59 | Admitting: Physical Therapy

## 2022-08-19 ENCOUNTER — Ambulatory Visit: Payer: 59 | Admitting: Physical Therapy

## 2022-08-24 ENCOUNTER — Ambulatory Visit: Payer: 59 | Admitting: Physical Therapy
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
## 2022-08-26 ENCOUNTER — Encounter: Payer: Self-pay | Admitting: Physical Therapy

## 2022-08-26 ENCOUNTER — Ambulatory Visit: Payer: 59 | Admitting: Physical Therapy

## 2022-08-26 DIAGNOSIS — R252 Cramp and spasm: Secondary | ICD-10-CM

## 2022-08-26 DIAGNOSIS — M357 Hypermobility syndrome: Secondary | ICD-10-CM

## 2022-08-31 ENCOUNTER — Ambulatory Visit: Payer: 59 | Admitting: Physical Therapy

## 2022-09-02 ENCOUNTER — Ambulatory Visit: Payer: 59 | Admitting: Physical Therapy

## 2022-09-02 ENCOUNTER — Encounter: Payer: Self-pay | Admitting: Physical Therapy

## 2022-09-02 DIAGNOSIS — R278 Other lack of coordination: Secondary | ICD-10-CM

## 2022-09-02 DIAGNOSIS — R490 Dysphonia: Secondary | ICD-10-CM

## 2022-09-02 DIAGNOSIS — M545 Low back pain, unspecified: Secondary | ICD-10-CM

## 2022-09-02 DIAGNOSIS — M357 Hypermobility syndrome: Secondary | ICD-10-CM | POA: Diagnosis not present

## 2022-09-02 DIAGNOSIS — R252 Cramp and spasm: Secondary | ICD-10-CM

## 2022-09-02 NOTE — Therapy (Signed)
OUTPATIENT PHYSICAL THERAPY TREATMENT NOTE    Patient Name: Brittany [name] MRN: [id number] DOB:14-May-1990, 32 y.o., female Today's Date: 09/02/2022  PCP: Jarold Motto PA REFERRING PROVIDER: Dr. Clementeen Graham  END OF SESSION:   PT End of Session - 09/02/22 1548     Visit Number 14    Number of Visits 18    Date for PT Re-Evaluation 10/04/22    Authorization Type [employer]    PT Start Time 1419    PT Stop Time 1500    PT Time Calculation (min) 41 min    Activity Tolerance Patient tolerated treatment well;Patient limited by pain    Behavior During Therapy WFL for tasks assessed/performed                         Past Medical History:  Diagnosis Date   Ankle injury    Anxiety    Eating disorder    Frequent headaches    Past Surgical History:  Procedure Laterality Date   ANKLE SURGERY  2018   HERNIA REPAIR     WISDOM TOOTH EXTRACTION     Patient Active Problem List   Diagnosis Date Noted   Positive ANA (antinuclear antibody) 10/19/2019   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   Anorexia nervosa, restricting type, in full remission, moderate 02/16/2018   History of depression 02/16/2018   GAD (generalized anxiety disorder) 12/16/2017   Pelvic floor dysfunction 12/16/2017   Right hip pain 11/02/2017   Grade 2 ankle sprain, right, initial encounter 05/16/2016   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   Right ankle pain 03/03/2015   IBS (irritable bowel syndrome) 05/25/2009    REFERRING DIAG: M35.7 (ICD-10-CM) - Benign hypermobility syndrome  THERAPY DIAG:  Hypermobility syndrome  Cramp and spasm  Dysphonia  Other lack of coordination  Chronic midline low back pain without sciatica  Rationale for Evaluation and Treatment Rehabilitation  PERTINENT HISTORY: see above  EDS, HSD  PRECAUTIONS: monitor lightheadedness, joint stability, pelvic pain   SUBJECTIVE:                                                                                                                                                                                       SUBJECTIVE STATEMENT:  I still have pain but I am trying to look for a specialist.  Idalia Needle told me about a book and I think I might find some information about it .  I feel like my body is all off   It just does not feel right   PAIN:  Are you having pain? Yes: NPRS scale: not rated  today /10 Pain location: neck and shoulders  Pain description: tight Aggravating factors: squatting, sleeping, malposition  Relieving factors: neck brace, the right exercises   Lower back and hip not as much pain  but still tight    OBJECTIVE: (objective measures completed at initial evaluation unless otherwise dated)  Assessment (as on reg. Template) Beighton Scale Lumbar (_1/1) Knees (1_/2) Elbows (2_/2)  5th digit (_2/2) Thumb (_1/2)   7/9 Beighton  Comment on hips, shoulders see below      DIAGNOSTIC FINDINGS:  IMPRESSION: No significant spinal canal or neural foraminal stenosis.   PATIENT SURVEYS:  FOTO NT    SCREENING FOR RED FLAGS: Bowel or bladder incontinence: No Spinal tumors: No Cauda equina syndrome: No Compression fracture: No Abdominal aneurysm: No   COGNITION: Overall cognitive status: Within functional limits for tasks assessed                          SENSATION: WFL   MUSCLE LENGTH: Hamstrings: 70-75 passive SLR  Thomas test: NT    POSTURE: rounded shoulders, forward head, and left pelvic obliquity   PALPATION: Gross tenderness to palpation to cervical thoracic and lumbar spine.  No areas of particular hypertonicity.  Skin is not particularly stretchy or velvety.   LUMBAR ROM:    AROM eval 08-26-22 palms to floor  Flexion Palms to floor  WNL with min to mod pain  Extension WNL min pain  WNL  Right lateral flexion WNL WNL  Left lateral flexion WNL WNL  Right rotation WNL WNL  Left rotation WNL  WNL   (Blank rows = not  tested)   LOWER EXTREMITY ROM:      Passive/(Active) Right eval Left eval R/L 09-02-22  Hip flexion WNL, pinching  WNL WNL/WNL but on R pulling/tearing like pain  Hip extension       Hip abduction       Hip adduction       Hip internal rotation WNL not excessive  WNL, slightly more than Rt.  WNL/WNL with more IR  Hip external rotation WNL not excessive  WNL  WNLnot excessive/WNL  Knee flexion WNL WNL WNL/WNL  Knee extension WNL Hyper  WNL/Hyper  Ankle dorsiflexion       Ankle plantarflexion       Ankle inversion       Ankle eversion        (Blank rows = not tested)   LOWER EXTREMITY MMT:     MMT Right eval Left eval Rt  07/13/22 R/L  09-02-22  Hip flexion 4+ pain  4+ NT  4+/4+  Hip extension        Hip abduction        Hip adduction        Hip internal rotation 4/5 pain  4+/5 5/5 5/5  Hip external rotation 4/5 pain  4+/5 5/5 5/5  Knee flexion 5/5 5/5 5/5 5/5  Knee extension 5/5 5/5 5/5 5/5  Ankle dorsiflexion        Ankle plantarflexion        Ankle inversion        Ankle eversion         (Blank rows = not tested)   LUMBAR SPECIAL TESTS:   Straight leg raise test: Negative and Single leg stance test: Negative No increased pain with air squats in lumbar spine Deeply squatting to the floor does increase neck pain which increases with time and straining  FUNCTIONAL TESTS:  NT on eval    GAIT: Distance walked: 150+ Assistive device utilized: None Level of assistance: Complete Independence Comments: NT    TODAY'S TREATMENT:                                                                                                                              [facility] Adult PT Treatment:                                                DATE: 09-02-22  Manual Therapy: STW over R masseter, R scalene,  bil UT/ LS, bil rhomboids.  R C-3 to C-6 cervical paraspinals suboccipitals.  Myofascial of scalenes Myofascial over R QL/iliacus Trigger Point Dry-Needling performed     by Garen Lah Treatment instructions: Expect mild to moderate muscle soreness. S/S of pneumothorax if dry needled over a lung field, and to seek immediate medical attention should they occur. Patient verbalized understanding of these instructions and education. Patient Consent Given: Yes Education handout provided: Previously provided Muscles treated: R QL,  R Iliacus R scalene, bil UT/ LS, bil rhomboids, R masseter,  Electrical stimulation performed: No Parameters: N/A Treatment response/outcome: twitch response noted, pt noted relief  Self Care: Community wellness opportunities/ pain control techniques  [facility] Adult PT Treatment:                                                DATE: 08-26-22 Therapeutic Exercise: Supine diaphragmatic breathing Open mouth lower jaw opening to R and L to maximize stretch post TPDN Deep neck flexor stretch Use of ball with UE for bil flexion and diagonals in conjunction with proper breathing Manual Therapy: STW over R masseter, R scalene/ SCM,  bil UT/ LS, bil rhomboids.  R C-3 to C-6 cervical paraspinals. Also guided stretching after TPDN of specific muscles Myofascial of scalenes Trigger Point Dry-Needling performed     by Garen Lah Treatment instructions: Expect mild to moderate muscle soreness. S/S of pneumothorax if dry needled over a lung field, and to seek immediate medical attention should they occur. Patient verbalized understanding of these instructions and education. Patient Consent Given: Yes Education handout provided: Previously provided Muscles treated:   R Iliacus R SCM R scalene, bil UT/ LS, bil rhomboids, R masseter,  Electrical stimulation performed: No Parameters: N/A Treatment response/outcome: twitch response noted, pt noted relief  [facility] Adult PT Treatment:                                                DATE: 08/09/22 Therapeutic Exercise: Cat  and camel x 10  Childs pose -relieves all pain  Bird dog  x 5 each side  Sit to stand x  10 lbs KB press (chest) Squat 10 lbs x 10  Dead lift 10 lbs  x 10  Single leg hip hinge with foot on pad  x 10 each , intermittent UE assist  Standing hip circles on Airex pad  Palloff rotation 1/2 kneeling (away from anchor) x 10 used blue band  Single arm cross body reach green band x 10  TRX Hinge to high knee x 10  Sidelying Rt QL, hip flexor on waist pillow  Manual Therapy: Light manual to Rt trunk in sidelying stretch position.  Quadratus Lumborum and obliques, iliacus   Prone soft tissue work to lumbar paraspinals Hip PROM with compression to glute (pin and stretch)   [facility] Adult PT Treatment:                                                DATE: 08/06/22 Therapeutic Exercise: Supine gentle mobility in shoulders overhead  Arm arcs  Open/close in bridge with ball between knees 90/90 hold with ball between knees,hands press to thighs 30 sec Arm arcs in 90/90 x 10  90/90 knee extension with ball , alt and then double leg Bridge with physio ball x 10, 2 sets  Oblique twist with ball x 10  Prone, standing with trunk on ball (plank) for UE series: extension , reverse fly and V x 15 each  Opp arm/opp leg lift on ball  High plank with hip extension x 5 added UE lift x 5 each    [facility] Adult PT Treatment:                                                DATE: 08-03-22 Therapeutic Exercise: Hip isometrics with PT manual Hip flexor stretch with hip flexion march from side of bed x 5 with PT CGAx 1 Supine diaphragmatic breathing Open mouth lower jaw opening to R and L to maximize stretch post TPDN Deep neck flexor stretch Manual Therapy: STW over R masseter, R scalene/ SCM,  bil UT/ LS, bil rhomboids.  R C-3 to C-6 cervical paraspinals. Also guided stretching after TPDN of specific muscles Myofascial of scalenes Trigger Point Dry-Needling performed     by Garen Lah Treatment instructions: Expect mild to moderate muscle soreness. S/S of pneumothorax if dry needled over a lung field,  and to seek immediate medical attention should they occur. Patient verbalized understanding of these instructions and education. Patient Consent Given: Yes Education handout provided: Previously provided Muscles treated:   R Iliacus R SCM R scalene, bil UT/ LS, bil rhomboids, R masseter,  R pterygoid Electrical stimulation performed: No Parameters: N/A Treatment response/outcome: twitch response noted, pt noted relief  Modalities: Moist hot pack to cervical and R masseter   [facility] Adult PT Treatment:                                                DATE: 07/26/22 Therapeutic Exercise: Hip isometrics with PT manual  Reciprocal inhibition :  black band Arcs (extension) and knee /hip extension x 10 each  Hamstring bridge: double and single leg bridge alt. Used 8 inch step Glute bridge calves on step x 10  Manual Therapy: PROM Rt hip Supine cervical soft tissue work to multiple trigger points in periscapular and posterior cervicals, levator scapula.  Self Care: Relaxation, working through solutions and self care pain strategies  Soft tissue self massage, use of balls/roller   [facility] Adult PT Treatment:                                                DATE: 07-22-22 Therapeutic Exercise: Supine diaphragmatic breathing Quadriped thread the needle stretch Open mouth lower jaw opening to R and L to maximize stretch post TPDN Deep neck flexor stretch Childs pose Manual Therapy: STW over R masseter, R scalene/ SCM,  bil UT/ LS, bil rhomboids.  R C-3 to C-6 cervical paraspinals. Myofascial of scalenes Trigger Point Dry-Needling performed     by Garen Lah Treatment instructions: Expect mild to moderate muscle soreness. S/S of pneumothorax if dry needled over a lung field, and to seek immediate medical attention should they occur. Patient verbalized understanding of these instructions and education.  Patient Consent Given: Yes Education handout provided: Previously provided Muscles treated:  R  SCM R scalene, bil UT/ LS, bil rhomboids Electrical stimulation performed: No Parameters: N/A Treatment response/outcome: twitch response noted, pt noted relief Modality Moist Hot pack  after TPDN   [facility] Adult PT Treatment:                                                DATE: 07/19/22 Therapeutic Exercise: Supine breathing, LE activation with foam roller  Hip flexor stretch, active with horizontal foam roller  Bridge LE's on bolster Mod Q-ped clam and then hip extension  Prone scapular retraction Prone goal post with ER x 10 then Lift off table x 10 Prone upper back/cobra x 10  Prone TrA with alternating knee bent  Prone hip extension x 10    PATIENT EDUCATION:  Education details: PT, POC, core strength and stability , SI belt and soft collar OK when needed  Person educated: Patient Education method: Explanation and Demonstration Education comprehension: verbalized understanding   HOME EXERCISE PROGRAM: Access Code: [email or web address] URL: [email or web address] Date: 05/24/2022 Prepared by: Karie Mainland  Exercises - Seated Chin Tuck with Neck Elongation  - 2 x daily - 7 x weekly - 2 sets - 10 reps - 5 hold - Standing Shoulder Horizontal Abduction with Resistance  - 2 x daily - 7 x weekly - 2 sets - 10 reps - 5 hold - Hooklying Transversus Abdominis Palpation  - 2 x daily - 7 x weekly - 2 sets - 10 reps - 5 hold - Supine 90/90 Abdominal Bracing  - 2 x daily - 7 x weekly - 1 sets - 3-5 reps - 10-15  hold - Supine 90/90 Alternating Heel Touches with Posterior Pelvic Tilt  - 2 x daily - 7 x weekly - 2 sets - 10 reps - 5 hold -cat/camel -Bridge + march    ASSESSMENT:   CLINICAL IMPRESSION:  Manual techniques utilized to reduce upper back and neck and jaw pain, tension.  Today  with TPDN. Pt consents for TPDN and is closely monitored throughout session.  Pt was closely monitored throughout session and ended with decreased muscle tension and exercise at end of session.    No  adverse reactions and no further questions at end of session. Left patient with moist heat pack in darkened room with deep breathing and total relaxation.   Has met # 3 LTG and still has questions about exercise progression. Will continue to work with primary PT for maximizing strength and decreasing pain with motion.and will need extension to continue and complete education about progression of exercise for Hypermobility. Pt will benefit from extension to complete 3-4 visits    OBJECTIVE IMPAIRMENTS: decreased activity tolerance, decreased strength, increased fascial restrictions, impaired flexibility, impaired UE functional use, improper body mechanics, postural dysfunction, and pain.    ACTIVITY LIMITATIONS: carrying, lifting, bending, sitting, standing, sleeping, and locomotion level   PARTICIPATION LIMITATIONS: laundry, interpersonal relationship, driving, shopping, community activity, and occupation   PERSONAL FACTORS: Past/current experiences and 3+ comorbidities: Anxiety, chronic pain, pelvic floor dysfunction  are also affecting patient's functional outcome.    REHAB POTENTIAL: Good   CLINICAL DECISION MAKING: Unstable/unpredictable   EVALUATION COMPLEXITY: High     GOALS: Goals reviewed with patient? Yes   SHORT TERM GOALS: Target date: 06/15/2022     Patient will be independent with basic home exercise program for core stability Baseline: Goal status: met   2.  Pt will be able to tolerate core routine in the clinic without increased pain Baseline: 07/05/22 able to do this today unsure about after effects  07/13/22 usually the same at times, better Goal status: met    3.  Pt will be able to report Rt hip pinching/tightness lessened to occasional and min with walking/activity.  Baseline: 07/13/22 feels tight to her , not sure if it is better or not  09-02-22 Pt does not feel pain is better It is not pinching but feels like aching pulling and " like being torn"  Goal  status:ongoing      LONG TERM GOALS: Target date: 07/13/2022     Pt will be able to show I with HEP in order to progress to more challenging resistance training.  Baseline:  09-02-22 Pt is able to perform exercises but unsure how to progress  Goal status: ongoing    2.  Pt will be able to report no more than min increase in neck pain when toileting Baseline:  09-02-22 Improved by 29%  Goal status: ongoing   3.  Pt will report improved rest at night due to less neck pain, rare need to wear neck brace at night.  Baseline: 07/13/22: wears this sometimes , not as effective as she once thought 09-02-22 Pt is not wearing neck brace now  Goal status: MET   4.  Pt will be able to report no back strain with supine core work with maintained pelvic neutral Baseline:  09-02-22 continues to have ongoing issues  Goal status: ongoing    PLAN:   PT FREQUENCY: 1-2 x/week revised    PT DURATION: 8 weeks   PLANNED INTERVENTIONS: Therapeutic exercises, Therapeutic activity, Neuromuscular re-education, Balance training, Patient/Family education, Self Care, Joint mobilization, Dry Needling, Electrical stimulation, Taping, Manual therapy, and Re-evaluation.   PLAN FOR NEXT SESSION: Dry needling to right QL?  Functional training,  core stability using soft Pilates ball, props.  Practice lifting without straining neck and upper traps  Garen Lah, PT, [facility] Certified Exercise Expert for the Aging  Adult  09/02/22 4:13 PM Phone: [phone] Fax: [phone]

## 2022-09-02 NOTE — Addendum Note (Signed)
Addended by: Jenelle Mages on: 09/02/2022 04:02 PM   Modules accepted: Orders

## 2022-09-07 ENCOUNTER — Ambulatory Visit: Payer: 59 | Admitting: Physical Therapy

## 2022-09-16 ENCOUNTER — Encounter: Payer: Self-pay | Admitting: Physical Therapy

## 2022-09-16 ENCOUNTER — Ambulatory Visit: Payer: 59 | Attending: Physician Assistant | Admitting: Physical Therapy

## 2022-09-16 DIAGNOSIS — R252 Cramp and spasm: Secondary | ICD-10-CM

## 2022-09-16 DIAGNOSIS — M357 Hypermobility syndrome: Secondary | ICD-10-CM

## 2022-09-16 NOTE — Therapy (Addendum)
OUTPATIENT PHYSICAL THERAPY TREATMENT NOTE     PHYSICAL THERAPY DISCHARGE SUMMARY  Visits from Start of Care: 14  Current functional level related to goals / functional outcomes: See below    Remaining deficits: Pain, excessive tension, soft tissue spasm    Education / Equipment: Extensive on core stability    Patient agrees to discharge. Patient goals were partially met. Patient is being discharged due to maximized rehab potential.      Patient Name: Brittany [name] MRN: [id number] DOB:11/18/1990, 32 y.o., female Today's Date: 09/16/2022  PCP: Jarold Motto PA REFERRING PROVIDER: Dr. Clementeen Graham  END OF SESSION:   PT End of Session - 09/02/22 1548     Visit Number 14    Number of Visits 18    Date for PT Re-Evaluation 10/04/22    Authorization Type [employer]    PT Start Time 1419    PT Stop Time 1500    PT Time Calculation (min) 41 min    Activity Tolerance Patient tolerated treatment well;Patient limited by pain    Behavior During Therapy [facility] for tasks assessed/performed             Past Medical History:  Diagnosis Date   Ankle injury    Anxiety    Eating disorder    Frequent headaches    Past Surgical History:  Procedure Laterality Date   ANKLE SURGERY  2018   HERNIA REPAIR     WISDOM TOOTH EXTRACTION     Patient Active Problem List   Diagnosis Date Noted   Positive ANA (antinuclear antibody) 10/19/2019   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  Rationale for Evaluation and Treatment Rehabilitation  PERTINENT HISTORY: see above  EDS, HSD  PRECAUTIONS: monitor lightheadedness, joint stability, pelvic pain   SUBJECTIVE:                                                                                                                                                                                      SUBJECTIVE STATEMENT:  I have been reading the book you recommended. Who else can I see for this problem.  The psoas pain is  different than the gripping pain I have in my neck and TMJ.   I would like more info on TMJ practitioners.  I am not sure the needling helps or not.  Rt hip aches.    PAIN:  Are you having pain? Yes: NPRS scale: moderate to severe /10 Pain location: neck and shoulders  Pain description: tight Aggravating factors: squatting, sleeping, malposition  Relieving factors: neck brace, the right exercises   Lower back and hip not as much pain  but still tight    OBJECTIVE: (objective measures completed at initial evaluation unless otherwise dated)  Assessment (as on reg. Template) Beighton Scale Lumbar (_1/1) Knees (1_/2) Elbows (2_/2)  5th digit (_2/2) Thumb (_1/2)   7/9 Beighton  Comment on hips, shoulders see below      DIAGNOSTIC FINDINGS:  IMPRESSION: No significant spinal canal or neural foraminal stenosis.   PATIENT SURVEYS:  FOTO NT    SCREENING FOR RED FLAGS: Bowel or bladder incontinence: No Spinal tumors: No Cauda equina syndrome: No Compression fracture: No Abdominal aneurysm: No   COGNITION: Overall cognitive status: Within functional limits for tasks assessed                          SENSATION: WFL   MUSCLE LENGTH: Hamstrings: 70-75 passive SLR  Thomas test: NT    POSTURE: rounded shoulders, forward head, and left pelvic obliquity   PALPATION: Gross tenderness to palpation to cervical thoracic and lumbar spine.  No areas  of particular hypertonicity.  Skin is not particularly stretchy or velvety.   LUMBAR ROM:    AROM eval 08-26-22 palms to floor  Flexion Palms to floor  WNL with min to mod pain  Extension WNL min pain  WNL  Right lateral flexion WNL WNL  Left lateral flexion WNL WNL  Right rotation WNL WNL  Left rotation WNL  WNL   (Blank rows = not tested)   LOWER EXTREMITY ROM:      Passive/(Active) Right eval Left eval R/L 09-02-22  Hip flexion WNL, pinching  WNL WNL/WNL but on R pulling/tearing like pain  Hip extension       Hip abduction       Hip adduction       Hip internal rotation WNL not excessive  WNL, slightly more than Rt.  WNL/WNL with more IR  Hip external rotation WNL not excessive  WNL  WNLnot excessive/WNL  Knee flexion WNL WNL WNL/WNL  Knee extension WNL Hyper  WNL/Hyper  Ankle dorsiflexion       Ankle plantarflexion       Ankle inversion       Ankle eversion        (Blank rows = not tested)   LOWER EXTREMITY MMT:     MMT Right eval Left eval Rt  07/13/22 R/L  09-02-22  Hip flexion 4+ pain  4+ NT  4+/4+  Hip extension        Hip abduction        Hip adduction        Hip internal rotation 4/5 pain  4+/5 5/5 5/5  Hip external rotation 4/5 pain  4+/5 5/5 5/5  Knee flexion 5/5 5/5 5/5 5/5  Knee extension 5/5 5/5 5/5 5/5  Ankle dorsiflexion        Ankle plantarflexion        Ankle inversion        Ankle eversion         (  Blank rows = not tested)   LUMBAR SPECIAL TESTS:   Straight leg raise test: Negative and Single leg stance test: Negative No increased pain with air squats in lumbar spine Deeply squatting to the floor does increase neck pain which increases with time and straining  FUNCTIONAL TESTS:  NT on eval    GAIT: Distance walked: 150+ Assistive device utilized: None Level of assistance: Complete Independence Comments: NT    TODAY'S TREATMENT:        [facility] Adult PT Treatment:                                                DATE: 09/16/22 Therapeutic  Exercise: Quadruped spine mobility  Thoracic rotation and lateral flexion  Quadruped bird dog reach with green band and then pulse x 8  Sidelying row with rotation green band  (increased shoulder pain )  Supine core:TrA and avoiding Rectus abdominus activation : knee and hip extension, dead bug variations  Self Care: Resources  : Ginger Lanae Boast PT, [facility] PT, [facility] , Integrative therapies                                                                                                                          [facility] Adult PT Treatment:                                                DATE: 09-02-22  Manual Therapy: STW over R masseter, R scalene,  bil UT/ LS, bil rhomboids.  R C-3 to C-6 cervical paraspinals suboccipitals.  Myofascial of scalenes Myofascial over R QL/iliacus Trigger Point Dry-Needling performed     by Garen Lah Treatment instructions: Expect mild to moderate muscle soreness. S/S of pneumothorax if dry needled over a lung field, and to seek immediate medical attention should they occur. Patient verbalized understanding of these instructions and education. Patient Consent Given: Yes Education handout provided: Previously provided Muscles treated: R QL,  R Iliacus R scalene, bil UT/ LS, bil rhomboids, R masseter,  Electrical stimulation performed: No Parameters: N/A Treatment response/outcome: twitch response noted, pt noted relief  Self Care: Community wellness opportunities/ pain control techniques  [facility] Adult PT Treatment:                                                DATE: 08-26-22 Therapeutic Exercise: Supine diaphragmatic breathing Open mouth lower jaw opening to R and L to maximize stretch post TPDN Deep neck flexor stretch Use of ball with UE for bil flexion and diagonals in conjunction with proper breathing Manual  Therapy: STW over R masseter, R scalene/ SCM,  bil UT/ LS, bil rhomboids.  R C-3 to C-6 cervical paraspinals. Also guided stretching after TPDN  of specific muscles Myofascial of scalenes Trigger Point Dry-Needling performed     by Garen Lah Treatment instructions: Expect mild to moderate muscle soreness. S/S of pneumothorax if dry needled over a lung field, and to seek immediate medical attention should they occur. Patient verbalized understanding of these instructions and education. Patient Consent Given: Yes Education handout provided: Previously provided Muscles treated:   R Iliacus R SCM R scalene, bil UT/ LS, bil rhomboids, R masseter,  Electrical stimulation performed: No Parameters: N/A Treatment response/outcome: twitch response noted, pt noted relief  [facility] Adult PT Treatment:                                                DATE: 08/09/22 Therapeutic Exercise: Cat and camel x 10  Childs pose -relieves all pain  Bird dog  x 5 each side  Sit to stand x 10 lbs KB press (chest) Squat 10 lbs x 10  Dead lift 10 lbs  x 10  Single leg hip hinge with foot on pad  x 10 each , intermittent UE assist  Standing hip circles on Airex pad  Palloff rotation 1/2 kneeling (away from anchor) x 10 used blue band  Single arm cross body reach green band x 10  TRX Hinge to high knee x 10  Sidelying Rt QL, hip flexor on waist pillow  Manual Therapy: Light manual to Rt trunk in sidelying stretch position.  Quadratus Lumborum and obliques, iliacus   Prone soft tissue work to lumbar paraspinals Hip PROM with compression to glute (pin and stretch)   [facility] Adult PT Treatment:                                                DATE: 08/06/22 Therapeutic Exercise: Supine gentle mobility in shoulders overhead  Arm arcs  Open/close in bridge with ball between knees 90/90 hold with ball between knees,hands press to thighs 30 sec Arm arcs in 90/90 x 10  90/90 knee extension with ball , alt and then double leg Bridge with physio ball x 10, 2 sets  Oblique twist with ball x 10  Prone, standing with trunk on ball (plank) for UE series: extension ,  reverse fly and V x 15 each  Opp arm/opp leg lift on ball  High plank with hip extension x 5 added UE lift x 5 each    [facility] Adult PT Treatment:                                                DATE: 08-03-22 Therapeutic Exercise: Hip isometrics with PT manual Hip flexor stretch with hip flexion march from side of bed x 5 with PT CGAx 1 Supine diaphragmatic breathing Open mouth lower jaw opening to R and L to maximize stretch post TPDN Deep neck flexor stretch Manual Therapy: STW over R masseter, R scalene/ SCM,  bil UT/ LS, bil rhomboids.  R C-3 to C-6 cervical  paraspinals. Also guided stretching after TPDN of specific muscles Myofascial of scalenes Trigger Point Dry-Needling performed     by Garen Lah Treatment instructions: Expect mild to moderate muscle soreness. S/S of pneumothorax if dry needled over a lung field, and to seek immediate medical attention should they occur. Patient verbalized understanding of these instructions and education. Patient Consent Given: Yes Education handout provided: Previously provided Muscles treated:   R Iliacus R SCM R scalene, bil UT/ LS, bil rhomboids, R masseter,  R pterygoid Electrical stimulation performed: No Parameters: N/A Treatment response/outcome: twitch response noted, pt noted relief  Modalities: Moist hot pack to cervical and R masseter   [facility] Adult PT Treatment:                                                DATE: 07/26/22 Therapeutic Exercise: Hip isometrics with PT manual  Reciprocal inhibition : black band Arcs (extension) and knee /hip extension x 10 each  Hamstring bridge: double and single leg bridge alt. Used 8 inch step Glute bridge calves on step x 10  Manual Therapy: PROM Rt hip Supine cervical soft tissue work to multiple trigger points in periscapular and posterior cervicals, levator scapula.  Self Care: Relaxation, working through solutions and self care pain strategies  Soft tissue self massage, use of  balls/roller   [facility] Adult PT Treatment:                                                DATE: 07-22-22 Therapeutic Exercise: Supine diaphragmatic breathing Quadriped thread the needle stretch Open mouth lower jaw opening to R and L to maximize stretch post TPDN Deep neck flexor stretch Childs pose Manual Therapy: STW over R masseter, R scalene/ SCM,  bil UT/ LS, bil rhomboids.  R C-3 to C-6 cervical paraspinals. Myofascial of scalenes Trigger Point Dry-Needling performed     by Garen Lah Treatment instructions: Expect mild to moderate muscle soreness. S/S of pneumothorax if dry needled over a lung field, and to seek immediate medical attention should they occur. Patient verbalized understanding of these instructions and education.  Patient Consent Given: Yes Education handout provided: Previously provided Muscles treated:  R SCM R scalene, bil UT/ LS, bil rhomboids Electrical stimulation performed: No Parameters: N/A Treatment response/outcome: twitch response noted, pt noted relief Modality Moist Hot pack  after TPDN   [facility] Adult PT Treatment:                                                DATE: 07/19/22 Therapeutic Exercise: Supine breathing, LE activation with foam roller  Hip flexor stretch, active with horizontal foam roller  Bridge LE's on bolster Mod Q-ped clam and then hip extension  Prone scapular retraction Prone goal post with ER x 10 then Lift off table x 10 Prone upper back/cobra x 10  Prone TrA with alternating knee bent  Prone hip extension x 10    PATIENT EDUCATION:  Education details: PT, POC, core strength and stability , SI belt and soft collar OK when needed  Person educated: Patient Education method: Explanation  and Demonstration Education comprehension: verbalized understanding   HOME EXERCISE PROGRAM: Access Code: [email or web address] URL: [email or web address] Date: 05/24/2022 Prepared by: Karie Mainland  Exercises - Seated Chin Tuck with  Neck Elongation  - 2 x daily - 7 x weekly - 2 sets - 10 reps - 5 hold - Standing Shoulder Horizontal Abduction with Resistance  - 2 x daily - 7 x weekly - 2 sets - 10 reps - 5 hold - Hooklying Transversus Abdominis Palpation  - 2 x daily - 7 x weekly - 2 sets - 10 reps - 5 hold - Supine 90/90 Abdominal Bracing  - 2 x daily - 7 x weekly - 1 sets - 3-5 reps - 10-15  hold - Supine 90/90 Alternating Heel Touches with Posterior Pelvic Tilt  - 2 x daily - 7 x weekly - 2 sets - 10 reps - 5 hold -cat/camel -Bridge + march    ASSESSMENT:   CLINICAL IMPRESSION: Patient has maximized her rehab potential at this time.  She has excellent strength and form with exercises.  She will benefit from a provider who can treat her problem more holistically and address chronic pain and tension in jaw/TMJ, shoulders and Rt psoas.  Her pain has really not improved since beginning PT but she does have tools she can use at home for managing her symptoms.  She was referred to alternate providers and will reach out in the future for further information or resources.    OBJECTIVE IMPAIRMENTS: decreased activity tolerance, decreased strength, increased fascial restrictions, impaired flexibility, impaired UE functional use, improper body mechanics, postural dysfunction, and pain.    ACTIVITY LIMITATIONS: carrying, lifting, bending, sitting, standing, sleeping, and locomotion level   PARTICIPATION LIMITATIONS: laundry, interpersonal relationship, driving, shopping, community activity, and occupation   PERSONAL FACTORS: Past/current experiences and 3+ comorbidities: Anxiety, chronic pain, pelvic floor dysfunction  are also affecting patient's functional outcome.    REHAB POTENTIAL: Good   CLINICAL DECISION MAKING: Unstable/unpredictable   EVALUATION COMPLEXITY: High     GOALS: Goals reviewed with patient? Yes   SHORT TERM GOALS: Target date: 06/15/2022     Patient will be independent with basic home exercise program  for core stability Baseline: Goal status: met   2.  Pt will be able to tolerate core routine in the clinic without increased pain Baseline: 07/05/22 able to do this today unsure about after effects  07/13/22 usually the same at times, better Goal status: met    3.  Pt will be able to report Rt hip pinching/tightness lessened to occasional and min with walking/activity.  Baseline: 07/13/22 feels tight to her , not sure if it is better or not  09-02-22 Pt does not feel pain is better It is not pinching but feels like aching pulling and " like being torn"  Goal status: not met      LONG TERM GOALS: Target date: 07/13/2022     Pt will be able to show I with HEP in order to progress to more challenging resistance training.  Baseline:  09-02-22 Pt is able to perform exercises but unsure how to progress  Goal status: met    2.  Pt will be able to report no more than min increase in neck pain when toileting Baseline:  09-02-22 Improved  Progress: continues to work on this and it is better  Goal status: partially met   3.  Pt will report improved rest at night due to less neck pain, rare need to wear neck brace  at night.  Baseline: 07/13/22: wears this sometimes , not as effective as she once thought 09-02-22 Pt is not wearing neck brace now  Goal status: met   4.  Pt will be able to report no back strain with supine core work with maintained pelvic neutral Baseline:  09-02-22 continues to have ongoing issues  Goal status: partially met    PLAN:   PT FREQUENCY: 1-2 x/week revised    PT DURATION: 8 weeks   PLANNED INTERVENTIONS: Therapeutic exercises, Therapeutic activity, Neuromuscular re-education, Balance training, Patient/Family education, Self Care, Joint mobilization, Dry Needling, Electrical stimulation, Taping, Manual therapy, and Re-evaluation.   PLAN FOR NEXT SESSION: DC from PT   Karie Mainland, PT 09/16/22 1:10 PM Phone: [phone] Fax: [phone]

## 2022-09-20 ENCOUNTER — Ambulatory Visit: Payer: 59 | Admitting: Physical Therapy

## 2022-09-29 ENCOUNTER — Ambulatory Visit: Payer: 59 | Admitting: Physical Therapy

## 2022-10-04 ENCOUNTER — Encounter: Payer: 59 | Admitting: Physical Therapy

## 2022-10-24 ENCOUNTER — Encounter: Payer: Self-pay | Admitting: Physician Assistant

## 2022-10-24 DIAGNOSIS — M79673 Pain in unspecified foot: Secondary | ICD-10-CM

## 2022-11-03 ENCOUNTER — Ambulatory Visit: Payer: 59 | Admitting: Podiatry

## 2022-11-03 ENCOUNTER — Encounter: Payer: Self-pay | Admitting: Podiatry

## 2022-11-03 VITALS — Ht 64.0 in | Wt 111.4 lb

## 2022-11-03 DIAGNOSIS — D2372 Other benign neoplasm of skin of left lower limb, including hip: Secondary | ICD-10-CM

## 2022-11-03 DIAGNOSIS — S90851A Superficial foreign body, right foot, initial encounter: Secondary | ICD-10-CM

## 2022-11-03 NOTE — Progress Notes (Signed)
  Subjective:  Patient ID: Brittany [name], female    DOB: 07-11-1990,   MRN: [id number]  Chief Complaint  Patient presents with   Foot Pain    NP Chestnuts corns implanted in foot    32 y.o. female presents for concern of foreign bodies in the right foot. Patient was walking barefoot a couple weeks ago and stepped on a chesnut ball and caused several splinters in the ball of her foot. Has been painful has tried to get several out but has still several areas in her skin.  . Denies any other pedal complaints. Denies n/v/f/c.   Past Medical History:  Diagnosis Date   Ankle injury    Anxiety    Eating disorder    Frequent headaches     Objective:  Physical Exam: Vascular: DP/PT pulses 2/4 bilateral. CFT <3 seconds. Normal hair growth on digits. No edema.  Skin. No lacerations or abrasions bilateral feet. Plantar right foot with several foreign bodies. Pieces of wood/splinter 25.  Musculoskeletal: MMT 5/5 bilateral lower extremities in DF, PF, Inversion and Eversion. Deceased ROM in DF of ankle joint.  Neurological: Sensation intact to light touch.   Assessment:   1. Foreign body in right foot, initial encounter      Plan:  Patient was evaluated and treated and all questions answered. -Discussed foreign bodies with patient and treatment options.  -Hyperkeratotic tissue was debrided with chisel without incident. Removed several pieces of debris from chestnut ball  -Applied salycylic acid treatment to area with dressing. Advised to remove bandaging tomorrow.  -Encouraged daily moisturizing -Discussed use of pumice stone -Advised good supportive shoes and inserts -Patient to return to office in two weeks for check   Louann Sjogren, DPM

## 2022-11-17 ENCOUNTER — Ambulatory Visit: Payer: 59 | Admitting: Podiatry

## 2022-12-24 ENCOUNTER — Emergency Department (HOSPITAL_BASED_OUTPATIENT_CLINIC_OR_DEPARTMENT_OTHER)
Admission: EM | Admit: 2022-12-24 | Discharge: 2022-12-24 | Disposition: A | Discharge status: Home / Self Care | Payer: 59 | Attending: Emergency Medicine | Admitting: Emergency Medicine

## 2022-12-24 ENCOUNTER — Encounter (HOSPITAL_BASED_OUTPATIENT_CLINIC_OR_DEPARTMENT_OTHER): Payer: Self-pay

## 2022-12-24 DIAGNOSIS — Z5321 Procedure and treatment not carried out due to patient leaving prior to being seen by health care provider: Secondary | ICD-10-CM | POA: Diagnosis not present

## 2022-12-24 DIAGNOSIS — R059 Cough, unspecified: Secondary | ICD-10-CM | POA: Diagnosis present

## 2022-12-24 NOTE — ED Triage Notes (Signed)
Pt stats she thought she got a piece of celery "or food in the side." States she's been coughing/ hacking for "like 2hrs & I feel like I got it out." Pt speaking in complete sentences, NAD at time of triage

## 2023-03-30 ENCOUNTER — Telehealth: Payer: Self-pay

## 2023-03-30 NOTE — Telephone Encounter (Signed)
 Patient called stating that her physical therapist has recommended for the patient to get an SLP referral to assist with hypoxia. Patient would like a call back. She was unable to send a Mychart message. Please return call when available.  Call transferred to front desk to schedule an appointment.

## 2023-03-31 ENCOUNTER — Encounter: Attending: Physical Medicine and Rehabilitation | Admitting: Physical Medicine and Rehabilitation

## 2023-03-31 DIAGNOSIS — M6289 Other specified disorders of muscle: Secondary | ICD-10-CM

## 2023-03-31 DIAGNOSIS — H539 Unspecified visual disturbance: Secondary | ICD-10-CM

## 2023-03-31 DIAGNOSIS — R0989 Other specified symptoms and signs involving the circulatory and respiratory systems: Secondary | ICD-10-CM | POA: Diagnosis not present

## 2023-03-31 DIAGNOSIS — K5900 Constipation, unspecified: Secondary | ICD-10-CM

## 2023-03-31 NOTE — Progress Notes (Signed)
 Subjective:    Patient ID: Brock Bad, female    DOB: [birth year], 33 y.o.   MRN: [id number]  HPI  An audio/video tele-health visit is felt to be the most appropriate encounter for this patient at this time. This is a follow up tele-visit via phone. The patient is at home. MD is at office. Prior to scheduling this appointment, our staff discussed the limitations of evaluation and management by telemedicine and the availability of in-person appointments. The patient expressed understanding and agreed to proceed.   Mrs. Escandon is a 33 year old woman who presents for f/u of chronic pelvic pain.  1) Pelvic floor dysfunction: -she saw a pelvic pain specialist who tried steroid and Botox injections and this made things worse, but he listened well -they second neuromodulation attempt did not help and was very painful. She now has scars on her buttocks -pain has been stable since last visit -saw the pelvic pain specialist and she received a lot of information -he did not have time to look into ECSWT much -her mental health is currently helping her the most.  -she is concerned that bowel retraining will not work. This is supposed to be when she only allows herself to be in the bathroom for a certain amount of time and number of times during the day.  -when she can get both her smooth and skeletal muscles to relax for her to go, it only lasts so long and she still has incomplete evacuation.  -she has good days and bad days.  -she is able to appreciate the good days -she asks how to get the medical records of her MRI to share with her therapist -she will be trying biofeedback today -she is trying dry needling in the pelvic flood and is seeing a pelvic floor therapist to do rectal biofeedback and trying rectal dilators. She has progressed in that she can use bigger ones.  -She has done PT which she feels is a temporary band aid.  -Average pain is 7/10 -She feels that some, but not all of  the pain is from anxiety. 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  -pain has been more bearable -she is trying to find a lot of mental health help -she is continuing with therapy -pain is still very bad often and extremely life-limiting.  -she has benefited from working with chiropractor -she is very limited in what she can physically do -sh has tried valium suppositories -she uses a splint and release tool and it helps to keep the prolapse in place to that she can have a more normal BM.   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  -the ENT has been helping with her throat tightness  3) Lower back pain -improved with chiropractor  4) Anxiety -is trying valium suppositories  5) Visual disturbances: -worse with stress -discussed with her father who has floaters and she has a family history of migraines  6) Constipation -still severe without improvement  Pain Inventory Average Pain 4 Pain Right Now 4 My pain is intermittent, sharp, dull, stabbing, tingling, and aching  In the last  24 hours, has pain interfered with the following? General activity 8 Relation with others 9 Enjoyment of life 9 What TIME of day is your pain at its worst? morning , daytime, and evening Sleep (in general) Fair  Pain is worse with: inactivity, unsure, and some activites Pain improves with: therapy/exercise Relief from Meds:1        Family History  Problem Relation Age of Onset   Depression Mother    Anxiety disorder Mother    Depression Brother    Anxiety disorder Brother    Diabetes Maternal Grandmother    Hearing loss Maternal Grandmother     Alcohol abuse Maternal Grandfather    Depression Maternal Grandfather    Diabetes Maternal Grandfather    Dementia Paternal Grandfather    Thyroid disease Brother    Social History   Socioeconomic History   Marital status: Married    Spouse name: Not on file   Number of children: Not on file   Years of education: Not on file   Highest education level: Not on file  Occupational History   Not on file  Tobacco Use   Smoking status: Never   Smokeless tobacco: Never  Vaping Use   Vaping status: Never Used  Substance and Sexual Activity   Alcohol use: No    Alcohol/week: 0.0 standard drinks of alcohol   Drug use: No   Sexual activity: Yes    Birth control/protection: None  Other Topics Concern   Not on file  Social History Narrative   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[place]   Social Drivers of Corporate investment banker Strain: Not on file  Food Insecurity: No Food Insecurity (03/10/2022)   Received [facility], [facility]   Hunger Vital Sign    Worried About Running Out of Food in the Last Year: Never true    Ran Out of Food in the Last Year: Never true  Transportation Needs: No Transportation Needs (03/10/2022)   Received from [facility], [facility]   PRAPARE - Transportation    In the past 12 months, has lack of transportation kept you from medical appointments or from getting medications?: No    Lack of Transportation (Non-Medical): No  Physical Activity: Not on file  Stress: Not on file  Social Connections: Not on file   Past Surgical History:  Procedure Laterality Date   ANKLE SURGERY  2018   HERNIA REPAIR     WISDOM TOOTH EXTRACTION     Past Medical History:  Diagnosis Date   Ankle injury    Anxiety    Eating disorder    Frequent headaches    There were no vitals taken for this visit.  Opioid Risk Score:   Fall Risk Score:  `1  Depression screen PHQ 2/9     03/16/2022     2:25 PM 02/03/2022   10:31 AM 12/15/2021    2:13 PM 10/12/2021   11:38 AM 06/09/2021   10:59 AM 10/24/2020    2:48 PM 08/26/2020    2:03 PM  Depression screen PHQ 2/9  Decreased Interest 0 1 1 0 0 1 1  Down, Depressed, Hopeless 0 1 1 0 0 2 1  PHQ - 2 Score 0 2 2 0 0 3 2  Altered sleeping  1    1   Tired, decreased energy  1    1   Change in appetite  0    0  Feeling bad or failure about yourself   3    3   Trouble concentrating  3    3   Moving slowly or fidgety/restless  1    1   Suicidal thoughts  0    0   PHQ-9 Score  11    12   Difficult doing work/chores  Very difficult    Very difficult    Review of Systems  Constitutional: Negative.        Night sweats  HENT: Negative.    Eyes: Negative.   Respiratory: Negative.    Cardiovascular: Negative.   Gastrointestinal:  Positive for abdominal pain, constipation and diarrhea.  Endocrine: Negative.   Musculoskeletal:  Positive for arthralgias, back pain and neck pain.       Stomach and pelvic area  Skin: Negative.   Allergic/Immunologic: Negative.   Psychiatric/Behavioral:  The patient is nervous/anxious.   All other systems reviewed and are negative.      Objective:   Physical Exam PRIOR EXAM Gen: no distress, normal appearing, BMI 20.08, weight 117 lbs, 108/69 HEENT: oral mucosa pink and moist, NCAT Cardio: Reg rate Chest: normal effort, normal rate of breathing Abd: soft, non-distended Ext: no edema Psych: pleasant, normal affect, discusses her anxieties regarding her pain and bowel regimen, OCD tendencies Skin: intact, red areas on chest Neuro: Alert and oriented x3  Assessment & Plan:  Mrs. Epperly is a 33 year old woman who presents for follow-up of pelvic floor dysfunction.  1) Pelvic floor dysfunction: -discussed that she saw a pelvic floor specialist and tried steroid injections and Botox injections and this made things worse -discussed that she saw a psychiatrist in [place] and she has finally been able to  progress -Discussed Zynex Nexwave and heating/cooling blanket  -discussed her negative response to enema.  -discussed her follow-up with the pelvic pain specialist -discussed the plan with trigger point injections with steroids -discussed the plan for Botox -she takes magnesium citrate and even then she cannot have complete evacuation. -encouraged rectal biofeedback today -discussed therapy is not helping enough -discussed her pelvic floor therapy -discussed extracorporeal shockwave therapy -discussed that MRI results are normal, back pain is likely ligamentous/muscular -discussed that she can request MRI records by calling medical records -continue pelvic floor PT, rectal biofeedback, progressive rectal dilation.  -once in a while she has bowel incontinence -She has pelvic floor pain and has had great benefit from pelvic floor therapists.  -discussed pudendal nerve entrapment, prolotherapy. Provided link for further education.  -discussed TCA. -discussed Botox as an option, discussed that I would recommend trying this.  -continue magnesium citrate.  -this has a huge impact on her quality of life.  -discussed anti-spasticity medications and Botox. Extensively discussed the benefits of Botox.  -Provided with a pain relief journal and discussed that it contains foods and lifestyle tips to naturally help to improve pain. Discussed that these lifestyle strategies are also very good for health unlike some medications which can have negative side effects. Discussed that the act of keeping a journal can be therapeutic and helpful to realize patterns what helps to trigger and alleviate pain.   -continue internal massaging -Discussed current symptoms of pain and history of pain.  -Discussed benefits of exercise in reducing pain. -Discussed following foods that may reduce pain: 1) Ginger (especially studied for arthritis)- reduce leukotriene production to decrease inflammation 2) Blueberries- high  in phytonutrients that decrease inflammation 3) Salmon- marine omega-3s reduce joint swelling and pain 4) Pumpkin seeds- reduce  inflammation 5) dark chocolate- reduces inflammation 6) turmeric- reduces inflammation 7) tart cherries - reduce pain and stiffness 8) extra virgin olive oil - its compound olecanthal helps to block prostaglandins  9) chili peppers- can be eaten or applied topically via capsaicin 10) mint- helpful for headache, muscle aches, joint pain, and itching 11) garlic- reduces inflammation  Link to further information on diet for chronic pain: [email or web address]   2) Anxiety: -continue Lexapro -she is seeing a mental health therapist.  -continue valium suppositories -Discussed exercise and meditation as tools to decrease anxiety. -Recommended Down Dog Yoga app -Discussed spending time outdoors. -Discussed positive re-framing of anxiety.  -Discussed the following foods that have been show to reduce anxiety: 1) [place] nuts, mushrooms, soy beans due to their high selenium content. Upper limit of toxicity of selenium is 484mcg/day so no more than 3-4 [place] nuts per day.  2) Fatty fish such as salmon, mackerel, sardines, trout, and herring- high in omega-3 fatty acids 3) Eggs- increases serotonin and dopamine 4) Pumpkin seeds- high in omega-3 fatty acids 5) dark chocolate- high in flavanols that increase blood flow to brain 6) turmeric- take with black pepper to increase absorption 7) chamomile tea- antioxidant and anti-inflammatory properties 8) yogurt without sugar- supports gut-brain axis 9) green tea- contains L- theanine 10) blueberries- high in vitamin C and antioxidants 11) [place]- high in tryptophan which gets converted to serotonin 12) bell peppers- rich in vitamin C and antioxidants 13) citrus fruits- rich in vitamin C and antioxidants 14) almonds- high in vitamin E and healthy fats 15)  chia seeds- high in omega-3 fatty acids  3) Sweating profusely at times: -could be autonomic dysfunction.   4) Hypermobility: -discussed with her that I will call as soon as EDS result returns.  -discussed current symptoms -discussed her following with an EDS specialist.   5) Constipation: -continue mag citrate as needed, discussed its health benefits.   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   7) Visual disturbances -discussed that these are not bad unless she is stressed  8) Low back pain, chronic, without sciatica.  -discussed bracing  9) Throat tightness -referred to [facility] at [facility] since her integrative therapist felt it would be beneficial to work with a specialist who works with apraxia patients -discussed looking for a vocal rehabilitation expert; discussed that she gave her some good input -encouraged follow-up with ENT since this is helping  25 minutes spent in discussion of her pelvic floor dysfunction, discussed that she saw a pelvic floor specialist and tried Botox and steroid injections and these made things worse, discussed that she saw a psychiatrist Dr. Michele Rockers in [place] and for the first time has started to progress, discussed that her vision abnormalities are stable unless she is stressed, discussed that she was recommended to work with a speech language therapist who works with apraxia patients, discussed that she still has to take the magnesium to help release her bowels fully

## 2023-04-15 ENCOUNTER — Encounter: Payer: Self-pay | Admitting: Podiatry

## 2023-04-15 ENCOUNTER — Encounter: Payer: Self-pay | Admitting: Physician Assistant

## 2023-04-20 NOTE — Telephone Encounter (Signed)
 I spoke with pt she is scheduled to come in and see Dr. Alvah Auerbach on 04/21/23

## 2023-04-21 ENCOUNTER — Ambulatory Visit: Admitting: Podiatry

## 2023-04-21 ENCOUNTER — Encounter: Payer: Self-pay | Admitting: Podiatry

## 2023-04-21 DIAGNOSIS — S91331A Puncture wound without foreign body, right foot, initial encounter: Secondary | ICD-10-CM

## 2023-04-21 NOTE — Progress Notes (Signed)
  Subjective:  Patient ID: Brittany [name], female    DOB: [birth year],   MRN: [id number]  No chief complaint on file.   33 y.o. female presents for concern of puncture wound of  right foot that happened about a week ago. Relates she stepped on a wood stick and was able to get most out but did penetrate really deep. Relates she has been keeping bandage and neosporin on it. Relates it was still hurting and last night was able to pull out another pieces of wood . Denies any other pedal complaints. Denies n/v/f/c.   Past Medical History:  Diagnosis Date   Ankle injury    Anxiety    Eating disorder    Frequent headaches     Objective:  Physical Exam: Vascular: DP/PT pulses 2/4 bilateral. CFT <3 seconds. Normal hair growth on digits. No edema.  Skin. No lacerations or abrasions bilateral feet. Plantar right lateral foot with small puncutre wound noted No foreign bodies noted remiaing. Healing well.  Musculoskeletal: MMT 5/5 bilateral lower extremities in DF, PF, Inversion and Eversion. Deceased ROM in DF of ankle joint.  Neurological: Sensation intact to light touch.   Assessment:   1. Puncture wound of right foot, initial encounter      Plan:  Patient was evaluated and treated and all questions answered. Punture wound/foreing body of right foot  -Debridement of area with no additional foreing body noted.  -Dressed with neosporin, DSD. -No abx indicated.  -Discussed if any worsening redness, pain, fever or chills to call or may need to report to the emergency room. Patient expressed understanding.  Return as needed    Return if symptoms worsen or fail to improve.   Jennefer Moats, DPM   e

## 2023-04-25 ENCOUNTER — Ambulatory Visit: Attending: Physical Medicine and Rehabilitation

## 2023-04-25 DIAGNOSIS — R0989 Other specified symptoms and signs involving the circulatory and respiratory systems: Secondary | ICD-10-CM | POA: Diagnosis not present

## 2023-04-25 DIAGNOSIS — R498 Other voice and resonance disorders: Secondary | ICD-10-CM | POA: Insufficient documentation

## 2023-04-25 NOTE — Therapy (Signed)
 OUTPATIENT SPEECH LANGUAGE PATHOLOGY VOICE EVALUATION   Patient Name: Brittany [name] MRN: [id number] DOB:[birth year], 33 y.o., female Today's Date: 04/25/2023  PCP: Alexander Iba, MD REFERRING PROVIDER: Laverle Postin, MD  END OF SESSION:  End of Session - 04/25/23 1731     Visit Number 1    Number of Visits 9   SLP preferred x2/week x2 weeks then x1/week x6 weeks but pt preferred x1/week x8 weeks   Date for SLP Re-Evaluation 07/01/23    SLP Start Time 1536    SLP Stop Time  1621    SLP Time Calculation (min) 45 min    Activity Tolerance Patient tolerated treatment well             Past Medical History:  Diagnosis Date   Ankle injury    Anxiety    Eating disorder    Frequent headaches    Past Surgical History:  Procedure Laterality Date   ANKLE SURGERY  2018   HERNIA REPAIR     WISDOM TOOTH EXTRACTION     Patient Active Problem List   Diagnosis Date Noted   Positive ANA (antinuclear antibody) 10/19/2019   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   Anorexia nervosa, restricting type, in full remission, moderate 02/16/2018   History of depression 02/16/2018   GAD (generalized anxiety disorder) 12/16/2017   Pelvic floor dysfunction 12/16/2017   Right hip pain 11/02/2017   Grade 2 ankle sprain, right, initial encounter 05/16/2016   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 It's just a whisper."  Pt accompanied by: self  PERTINENT HISTORY: Pt had course of ST with SLP - [facility] in 2023 and then 4 sessions of ST with ST- [facility] at [facility] in  January-March 2024. PMHx includes anxiety, functional movement disorder. Pt has an integrative medicine therapist who told her she is not breathing well and asked her to obtain a BREATHER device. Pt did so and requires assistance how to use properly. Pt's physiatrist wrote script for this reason.  PAIN:  Are you having pain? No  FALLS: Has patient fallen in last 6 months? No  LIVING ENVIRONMENT: Lives with: lives with their family Lives in: House/apartment  PLOF:Level of assistance: Independent with ADLs, Independent with IADLs Employment: Full-time employment  PATIENT GOALS: Improve talking/singing, improve breath support  OBJECTIVE:  Note: Objective measures were completed at Evaluation unless otherwise noted.  DIAGNOSTIC FINDINGS:   COGNITION: Overall cognitive status: Within functional limits for tasks assessed  SOCIAL HISTORY: Occupation: Occupation does not require strenuous voice use Daily voice use: moderate  PERCEPTUAL VOICE ASSESSMENT: Voice quality: normal Vocal abuse:  none noted today; subjectively pt's laughing at louder than WNL loudness Resonance: normal Respiratory function: thoracic breathing and diaphragmatic/abdominal breathing  OBJECTIVE VOICE ASSESSMENT: Maximum phonation time for sustained "ah": 18.5 Conversational pitch average: 175 Hz -nomal/low-WNL Conversational pitch range: 102-295 Hz Conversational loudness average: 70 dB -normal Conversational loudness range: 53-81 dB S/z ratio: 0.92 (Suggestive of dysfunction >1.0)  PATIENT REPORTED OUTCOME MEASURES (PROM): Pt-reported scaling measure regarding throat tightness, where 10=tightest the throat has felt - to be asked at session #1  TREATMENT DATE:  AB = Abdominal breathing  04/25/23: SLP assessed pt's performance with BREATHER device. SLP had to provide mod cues usually  for proper procedure for inhalation and exhalation. Instruction occurred with shoulder position, head position, and when to increase resistance  PATIENT EDUCATION: Education details: see "treatment date" Person educated: Patient Education method: Explanation, Demonstration, Verbal cues, and Handouts Education comprehension: verbalized understanding, returned demonstration, verbal cues required, and needs further education  HOME EXERCISE PROGRAM: BREATHER device, voice exercises pt finds helpful  GOALS: Goals reviewed with patient? Yes, in general  SHORT TERM GOALS: Target date: 05/27/23  Pt will use BREATHER device with proper procedure in 3 sessions Baseline: Goal status: INITIAL  2.  Pt will complete vocal exercises with proper procedure with rare min A in 3 sessions Baseline:  Goal status: INITIAL  3.  Pt will use trained technique to reduce tension in throat (when tension present to decr vocal quality) by a scale point of 1 in scale 1-10 (10=most tension ever felt) Baseline:  Goal status: INITIAL  4. Pt will use AB in simple question/answer tasks with 80% success in 3 sessions Baseline:  Goal status: INITIAL   LONG TERM GOALS: Target date: 06/24/23  Pt will improve PROM from initial administration Baseline:  Goal status: INITIAL  2.  Pt will use BREATHER device with proper procedure in 2 sessions after 05/27/23 Baseline:  Goal status: INITIAL  3.  Pt will complete vocal exercises with proper procedure, with mod I, in 2 sessions Baseline:  Goal status: INITIAL  4.  In/between two sessions pt will use/report use of trained technique/s to reduce tension in throat (when tension present to decr vocal quality) by a scale point of 2 in scale 1-10 (10=most tension ever felt), after 05/27/23 Baseline:  Goal status: INITIAL  5.  Pt will demo knowledge of when to incr resistance of BREATHER, when asked (or observed) in 2 sessions Baseline:  Goal status:  INITIAL   ASSESSMENT:  CLINICAL IMPRESSION: Patient is a 33 y.o. F who was seen today for evaluation due to periodic tightness in throat when speaking making it difficult for her to talk and sing. Pt is amenable to proceed with ST for breath support (use of proper technique with BREATHER), use of visualization to relax throat, and refresher for voice exercises without current imaging of laryngeal function. At time of eval, pt's voice quality sounds very much WNL, but pt reports that voice quality is intermittent, and decreases with sx of tight feeling in her throat. Given pt's current sx and report, SLP has no reason to believe any laryngeal structural abnormalities exist that would warrant follow up laryngeal imaging at this time. Certainly if this changed during treatment course, SLP will recommend strongly to referring MD to make a referral to ENT.   OBJECTIVE IMPAIRMENTS: include voice disorder. These impairments are limiting patient from household responsibilities, ADLs/IADLs, and effectively communicating at home and in community. Factors affecting potential to achieve goals and functional outcome are co-morbidities.. Patient will benefit from skilled SLP services to address above impairments and improve overall function.  REHAB POTENTIAL: Good  PLAN:  SLP FREQUENCY: 1x/week  SLP DURATION: 8 weeks (SLP desired x2/week x2 weeks then once/week x6 weeks but pt desired once/week x8 weeks)  PLANNED INTERVENTIONS: Cueing hierachy, Oral motor exercises, SLP instruction and feedback, Compensatory strategies, Patient/family education, and [id number] Treatment of speech (30 or 45 min)     Roland Lipke, CCC-SLP 04/25/2023, 5:34 PM

## 2023-05-09 ENCOUNTER — Ambulatory Visit

## 2023-05-18 ENCOUNTER — Ambulatory Visit: Attending: Physical Medicine and Rehabilitation

## 2023-05-18 DIAGNOSIS — R498 Other voice and resonance disorders: Secondary | ICD-10-CM | POA: Insufficient documentation

## 2023-05-18 NOTE — Therapy (Signed)
 OUTPATIENT SPEECH LANGUAGE PATHOLOGY VOICE TREATMENT   Patient Name: Brittany [name] MRN: [id number] DOB:1990-01-17, 33 y.o., female Today's Date: 05/18/2023  PCP: Alexander Iba, MD REFERRING PROVIDER: Laverle Postin, MD  END OF SESSION:  End of Session - 05/18/23 1112     Visit Number 2    Number of Visits 9   SLP preferred x2/week x2 weeks then x1/week x6 weeks but pt preferred x1/week x8 weeks   Date for SLP Re-Evaluation 07/01/23    SLP Start Time 1108    SLP Stop Time  1145    SLP Time Calculation (min) 37 min    Activity Tolerance Patient tolerated treatment well             Past Medical History:  Diagnosis Date   Ankle injury    Anxiety    Eating disorder    Frequent headaches    Past Surgical History:  Procedure Laterality Date   ANKLE SURGERY  2018   HERNIA REPAIR     WISDOM TOOTH EXTRACTION     Patient Active Problem List   Diagnosis Date Noted   Positive ANA (antinuclear antibody) 10/19/2019   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   Anorexia nervosa, restricting type, in full remission, moderate 02/16/2018   History of depression 02/16/2018   GAD (generalized anxiety disorder) 12/16/2017   Pelvic floor dysfunction 12/16/2017   Right hip pain 11/02/2017   Grade 2 ankle sprain, right, initial encounter 05/16/2016   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  Pt accompanied by: self  PERTINENT HISTORY: Pt had course of ST with SLP - Thomas in 2023 and then 4 sessions of ST with ST- Morris at  [facility] in January-March 2024. PMHx includes anxiety, functional movement disorder. Pt has an integrative medicine therapist who told her she is not breathing well and asked her to obtain a BREATHER device. Pt did so and requires assistance how to use properly. Pt's physiatrist wrote script for this reason.  PAIN:  Are you having pain? Yes: NPRS scale: 5 Pain location: rt flank, up to rt neck Pain description: extremely tense Aggravating factors: when things are sublexed Relieving factors: medication, self massage  FALLS: Has patient fallen in last 6 months? No  LIVING ENVIRONMENT: Lives with: lives with their family Lives in: House/apartment  PLOF:Level of assistance: Independent with ADLs, Independent with IADLs Employment: Full-time employment  PATIENT GOALS: Improve talking/singing, improve breath support  OBJECTIVE:  Note: Objective measures were completed at Evaluation unless otherwise noted.    PATIENT REPORTED OUTCOME MEASURES (PROM): Pt-reported scaling measure regarding throat tightness, where 10=tightest the throat has felt - to be asked at session #1  TREATMENT DATE:  AB = Abdominal breathing  05/18/23: Needs to be asked about throat tightness next session. Pt communicated within first 5 minutes of session x2 that she was unsure she was performing HEP with BREATHER correctly. SLP observed pt with inhalation and exhalation and she was performing with excellent procedure.  Long discussion about why not advised to switch settings from set to set or from day to day - need to assess if effort level was under 70% and if so, switch setting to next highest number, and SLP provided rationale for this. Pt thanked SLP.  04/25/23: SLP assessed pt's performance with BREATHER device. SLP had to provide mod cues usually for proper procedure for inhalation and  exhalation. Instruction occurred with shoulder position, head position, and when to increase resistance  PATIENT EDUCATION: Education details: see "treatment date" Person educated: Patient Education method: Explanation, Demonstration, Verbal cues, and Handouts Education comprehension: verbalized understanding, returned demonstration, verbal cues required, and needs further education  HOME EXERCISE PROGRAM: BREATHER device, voice exercises pt finds helpful  GOALS: Goals reviewed with patient? Yes, in general  SHORT TERM GOALS: Target date: 05/27/23  Pt will use BREATHER device with proper procedure in 3 sessions Baseline: 05/18/23 Goal status: INITIAL  2.  Pt will complete vocal exercises with proper procedure with rare min A in 3 sessions Baseline:  Goal status: INITIAL  3.  Pt will use trained technique to reduce tension in throat (when tension present to decr vocal quality) by a scale point of 1 in scale 1-10 (10=most tension ever felt) Baseline:  Goal status: INITIAL  4. Pt will use AB in simple question/answer tasks with 80% success in 3 sessions Baseline:  Goal status: INITIAL   LONG TERM GOALS: Target date: 06/24/23  Pt will improve PROM from initial administration Baseline:  Goal status: INITIAL  2.  Pt will use BREATHER device with proper procedure in 2 sessions after 05/27/23 Baseline:  Goal status: INITIAL  3.  Pt will complete vocal exercises with proper procedure, with mod I, in 2 sessions Baseline:  Goal status: INITIAL  4.  In/between two sessions pt will use/report use of trained technique/s to reduce tension in throat (when tension present to decr vocal quality) by a scale point of 2 in scale 1-10 (10=most tension ever felt), after 05/27/23 Baseline:  Goal status: INITIAL  5.  Pt will demo knowledge of when to incr resistance of BREATHER, when asked (or observed) in 2 sessions Baseline:  Goal status: INITIAL   ASSESSMENT:  CLINICAL  IMPRESSION: Patient is a 33 y.o. F who was seen today for treatment due to periodic tightness in throat when speaking making it difficult for her to talk and sing. Pt is amenable to continue with ST for breath support (use of proper technique with BREATHER), use of visualization to relax throat, and refresher for voice exercises without current imaging of laryngeal function. At time of eval, pt's voice quality sounds very much WNL, but pt reports that voice quality is intermittent, and decreases with sx of tight feeling in her throat. Given pt's current sx and report, SLP has no reason to believe any laryngeal structural abnormalities exist that would warrant follow up laryngeal imaging at this time. Certainly if this changed during treatment course, SLP will recommend strongly to referring MD to make a referral to ENT.   OBJECTIVE IMPAIRMENTS: include voice disorder. These impairments are limiting patient from household responsibilities, ADLs/IADLs, and effectively communicating at home and in community. Factors affecting potential  to achieve goals and functional outcome are co-morbidities.. Patient will benefit from skilled SLP services to address above impairments and improve overall function.  REHAB POTENTIAL: Good  PLAN:  SLP FREQUENCY: 1x/week  SLP DURATION: 8 weeks (SLP desired x2/week x2 weeks then once/week x6 weeks but pt desired once/week x8 weeks)  PLANNED INTERVENTIONS: Cueing hierachy, Oral motor exercises, SLP instruction and feedback, Compensatory strategies, Patient/family education, and [id number] Treatment of speech (30 or 45 min)     Tylerjames Hoglund, CCC-SLP 05/18/2023, 11:13 AM

## 2023-06-01 ENCOUNTER — Ambulatory Visit

## 2023-06-01 DIAGNOSIS — R498 Other voice and resonance disorders: Secondary | ICD-10-CM

## 2023-06-01 NOTE — Therapy (Signed)
 OUTPATIENT SPEECH LANGUAGE PATHOLOGY VOICE TREATMENT   Patient Name: Brittany [name] MRN: [id number] DOB:1990-10-02, 33 y.o., female Today's Date: 06/01/2023  PCP: Brittany Iba, MD REFERRING PROVIDER: Laverle Postin, MD  END OF SESSION:  End of Session - 06/01/23 1327     Visit Number 3    Number of Visits 9   SLP preferred x2/week x2 weeks then x1/week x6 weeks but pt preferred x1/week x8 weeks   Date for SLP Re-Evaluation 07/01/23    SLP Start Time 1323    SLP Stop Time  1400    SLP Time Calculation (min) 37 min    Activity Tolerance Patient tolerated treatment well             Past Medical History:  Diagnosis Date   Ankle injury    Anxiety    Eating disorder    Frequent headaches    Past Surgical History:  Procedure Laterality Date   ANKLE SURGERY  2018   HERNIA REPAIR     WISDOM TOOTH EXTRACTION     Patient Active Problem List   Diagnosis Date Noted   Positive ANA (antinuclear antibody) 10/19/2019   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   Anorexia nervosa, restricting type, in full remission, moderate 02/16/2018   History of depression 02/16/2018   GAD (generalized anxiety disorder) 12/16/2017   Pelvic floor dysfunction 12/16/2017   Right hip pain 11/02/2017   Grade 2 ankle sprain, right, initial encounter 05/16/2016   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  Pt accompanied by: self  PERTINENT HISTORY: Pt had course of ST with SLP - Brittany [name] in 2023 and then 4 sessions of ST with ST- Brittany [name] at  [name] in January-March 2024. PMHx includes anxiety, functional movement disorder. Pt has an integrative medicine therapist who told her she is not breathing well and asked her to obtain a BREATHER device. Pt did so and requires assistance how to use properly. Pt's physiatrist wrote script for this reason.  PAIN:  Are you having pain? Yes: NPRS scale: 5 Pain location: rt flank, up to rt neck Pain description: extremely tense Aggravating factors: when things are sublexed Relieving factors: medication, self massage  FALLS: Has patient fallen in last 6 months? No  LIVING ENVIRONMENT: Lives with: lives with their family Lives in: House/apartment  PLOF:Level of assistance: Independent with ADLs, Independent with IADLs Employment: Full-time employment  PATIENT GOALS: Improve talking/singing, improve breath support  OBJECTIVE:  Note: Objective measures were completed at Evaluation unless otherwise noted.    PATIENT REPORTED OUTCOME MEASURES (PROM): Pt-reported that throat tightness can vary from day to day - pt unsure if she can scale tightness.  TREATMENT DATE:  AB = Abdominal breathing  06/01/23: Excellent (WNL) voice quality today. Brittany [name] reports she is "modifying" the vocal exercises at [facility] with some techniques of her own. SLP told pt to bring the exercises [facility] gave her and whatever aspects of those exercises SLP can verify their accuracy. Pt used AB in 8 minutes conversation today 90% of the time. SLP again told pt do NOT move intensity of BREATHER device prior to performing 10 reps BID x3 days at <70% effort. SLP provided rationale for this.  05/18/23: Needs to be asked about throat tightness next session. Pt communicated within first 5 minutes of session x2 that she was unsure she was performing HEP with BREATHER correctly. SLP observed pt with  inhalation and exhalation and she was performing with excellent procedure.  Long discussion about why not advised to switch settings from set to set or from day to day - need to assess if effort level was under 70% and if so, switch setting to next highest number, and SLP provided rationale for this. Pt thanked SLP.  04/25/23: SLP assessed pt's performance with BREATHER device. SLP had to provide mod cues usually for proper procedure for inhalation and exhalation. Instruction occurred with shoulder position, head position, and when to increase resistance  PATIENT EDUCATION: Education details: see "treatment date" Person educated: Patient Education method: Explanation, Demonstration, Verbal cues, and Handouts Education comprehension: verbalized understanding, returned demonstration, verbal cues required, and needs further education  HOME EXERCISE PROGRAM: BREATHER device, voice exercises pt finds helpful  GOALS: Goals reviewed with patient? Yes, in general  SHORT TERM GOALS: Target date: 05/27/23  Pt will use BREATHER device with proper procedure in 3 sessions Baseline: 05/18/23, 06/01/23 Goal status: INITIAL  2.  Pt will complete vocal exercises with proper procedure with rare min A in 3 sessions Baseline:  Goal status: INITIAL  3.  Pt will use trained technique to reduce tension in throat (when tension present to decr vocal quality) by a scale point of 1 in scale 1-10 (10=most tension ever felt) Baseline:  Goal status: INITIAL  4. Pt will use AB in 8 minutes conversation with 80% success in 3 sessions Baseline: 06/01/23 Goal status: modified based on 06/01/23    LONG TERM GOALS: Target date: 06/24/23  Pt will improve PROM from initial administration Baseline:  Goal status: INITIAL  2.  Pt will use BREATHER device with proper procedure in 2 sessions after 05/27/23 Baseline:  Goal status: INITIAL  3.  Pt will complete vocal exercises with proper procedure, with mod I, in 2  sessions Baseline:  Goal status: INITIAL  4.  In/between two sessions pt will use/report use of trained technique/s to reduce tension in throat (when tension present to decr vocal quality) by a scale point of 2 in scale 1-10 (10=most tension ever felt), after 05/27/23 Baseline:  Goal status: INITIAL  5.  Pt will demo knowledge of when to incr resistance of BREATHER, when asked (or observed) in 2 sessions Baseline:  Goal status: INITIAL   ASSESSMENT:  CLINICAL IMPRESSION: Patient is a 33 y.o. F who was seen today for treatment due to periodic tightness in throat when speaking making it difficult for her to talk and sing. Pt states this tightness is dependent upon her joints each day. Pt is amenable to continue with ST for breath support (use of proper technique with BREATHER), use of visualization to relax throat, and refresher for voice exercises without current imaging of laryngeal function. At time of eval,  pt's voice quality sounds very much WNL, but pt reports that voice quality is intermittent, and decreases with sx of tight feeling in her throat. Given pt's current sx and report, SLP has no reason to believe any laryngeal structural abnormalities exist that would warrant follow up laryngeal imaging at this time. Certainly if this changed during treatment course, SLP will recommend strongly to referring MD to make a referral to ENT.   OBJECTIVE IMPAIRMENTS: include voice disorder. These impairments are limiting patient from household responsibilities, ADLs/IADLs, and effectively communicating at home and in community. Factors affecting potential to achieve goals and functional outcome are co-morbidities.. Patient will benefit from skilled SLP services to address above impairments and improve overall function.  REHAB POTENTIAL: Good  PLAN:  SLP FREQUENCY: 1x/week  SLP DURATION: 8 weeks (SLP desired x2/week x2 weeks then once/week x6 weeks but pt desired once/week x8 weeks)  PLANNED  INTERVENTIONS: Cueing hierachy, Oral motor exercises, SLP instruction and feedback, Compensatory strategies, Patient/family education, and [id number] Treatment of speech (30 or 45 min)     Mahasin Riviere, CCC-SLP 06/01/2023, 2:06 PM

## 2023-06-02 NOTE — Progress Notes (Unsigned)
 [facility] [address] [place] [address] Phone: [phone] Subjective:   Brittany [name], am serving as a scribe for Dr. Ronnell Coins.  I'm seeing this patient by the request  of:  Alexander Iba, [place]  CC: Pain  [id number]  Brittany [name] is a 33 y.o. female coming in with complaint of polyarthralgia.  Patient has been seen by physical therapy and speech pathology it appears recently.  Patient states that she wonders if she has mast cell activation syndrome. Also has visceral hypersensitivity. Wants to know what to do about inflammation responses.   See chiropracter in [place]. Saw temporary relief.   Has had significant workup including genetic testing for connective tissue disorders which were unremarkable.  Patient also has had autoimmune testing over the years with last labs in 2022.   MRI of the lumbar was done in June 2023 and completely unremarkable.  Mild scoliosis noted at the lumbar spine.  Past Medical History:  Diagnosis Date   Ankle injury    Anxiety    Eating disorder    Frequent headaches    Past Surgical History:  Procedure Laterality Date   ANKLE SURGERY  2018   HERNIA REPAIR     WISDOM TOOTH EXTRACTION     Social History   Socioeconomic History   Marital status: Married    Spouse name: Not on file   Number of children: Not on file   Years of education: Not on file   Highest education level: Not on file  Occupational History   Not on file  Tobacco Use   Smoking status: Never   Smokeless tobacco: Never  Vaping Use   Vaping status: Never Used  Substance and Sexual Activity   Alcohol use: No    Alcohol/week: 0.0 standard drinks of alcohol   Drug use: No   Sexual activity: Yes    Birth control/protection: None  Other Topics Concern   Not on file  Social History Narrative   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   Social Drivers of Corporate investment banker Strain: Not on file   Food Insecurity: No Food Insecurity (03/10/2022)   Received from [facility], [facility]   Hunger Vital Sign    Worried About Running Out of Food in the Last Year: Never true    Ran Out of Food in the Last Year: Never true  Transportation Needs: No Transportation Needs (03/10/2022)   Received from [facility], [facility]   PRAPARE - Transportation    In the past 12 months, has lack of transportation kept you from medical appointments or from getting medications?: No    Lack of Transportation (Non-Medical): No  Physical Activity: Not on file  Stress: Not on file  Social Connections: Not on file   No Known Allergies Family History  Problem Relation Age of Onset   Depression Mother    Anxiety disorder Mother    Depression Brother    Anxiety disorder Brother    Diabetes Maternal Grandmother    Hearing loss Maternal Grandmother    Alcohol abuse Maternal Grandfather    Depression Maternal Grandfather    Diabetes Maternal Grandfather    Dementia Paternal Grandfather    Thyroid  disease Brother        Current Outpatient Medications (Analgesics):    acetaminophen (TYLENOL) 325 MG tablet, Take by mouth.   Current Outpatient Medications (Other):  escitalopram (LEXAPRO) 20 MG tablet, Take 1 tablet (20 mg total) by mouth daily.   magnesium citrate SOLN, Take by mouth.   desipramine (NORPRAMIN) 50 MG tablet, Take 50 mg by mouth daily.   Reviewed prior external information including notes and imaging from  primary care provider As well as notes that were available from care everywhere and other healthcare systems.  Past medical history, social, surgical and family history all reviewed in electronic medical record.  No pertanent information unless stated regarding to the chief complaint.   Review of Systems:  No  visual changes, nausea, vomiting, diarrhea, constipation, dizziness, abdominal pain, skin rash,  fevers, chills, night sweats, weight loss, swollen lymph nodes, chest pain, shortness of breath, mood changes. POSITIVE muscle aches, difficulty with breathing but not short of breath, body aches, intermittent joint swelling, headache  Objective  Blood pressure 94/64, pulse (!) 105, height 5\' 4"  (1.626 m), weight 117 lb (53.1 kg), SpO2 98%.   General: No apparent distress alert and oriented x3 mood and affect normal, dressed appropriately.  HEENT: Pupils equal, extraocular movements intact  Respiratory: Patient's speak in full sentences and does not appear short of breath  Cardiovascular: No lower extremity edema, non tender, no erythema  Back exam shows patient does have some hypermobility noted. Patient does have relatively good range of motion of the hips noted.  Neurovascularly intact distally.  Sitting relatively comfortably.  Deep tendon reflexes of the lower extremity intact.   Impression and Recommendations:     The above documentation has been reviewed and is accurate and complete Brittany Ammons M Zackory Pudlo, DO

## 2023-06-03 ENCOUNTER — Ambulatory Visit: Admitting: Family Medicine

## 2023-06-03 VITALS — BP 94/64 | HR 105 | Ht 64.0 in | Wt 117.0 lb

## 2023-06-03 DIAGNOSIS — R768 Other specified abnormal immunological findings in serum: Secondary | ICD-10-CM

## 2023-06-03 DIAGNOSIS — M2550 Pain in unspecified joint: Secondary | ICD-10-CM

## 2023-06-03 LAB — VITAMIN D 25 HYDROXY (VIT D DEFICIENCY, FRACTURES): VITD: 37.36 ng/mL (ref 30.00–100.00)

## 2023-06-03 LAB — URIC ACID: Uric Acid, Serum: 3.7 mg/dL (ref 2.4–7.0)

## 2023-06-03 LAB — CBC WITH DIFFERENTIAL/PLATELET
Basophils Absolute: 0 10*3/uL (ref 0.0–0.1)
Basophils Relative: 0.6 % (ref 0.0–3.0)
Eosinophils Absolute: 0.3 10*3/uL (ref 0.0–0.7)
Eosinophils Relative: 7.4 % — ABNORMAL HIGH (ref 0.0–5.0)
HCT: 39.9 % (ref 36.0–46.0)
Hemoglobin: 13.7 g/dL (ref 12.0–15.0)
Lymphocytes Relative: 25.6 % (ref 12.0–46.0)
Lymphs Abs: 1 10*3/uL (ref 0.7–4.0)
MCHC: 34.3 g/dL (ref 30.0–36.0)
MCV: 90.7 fl (ref 78.0–100.0)
Monocytes Absolute: 0.4 10*3/uL (ref 0.1–1.0)
Monocytes Relative: 10.2 % (ref 3.0–12.0)
Neutro Abs: 2.2 10*3/uL (ref 1.4–7.7)
Neutrophils Relative %: 56.2 % (ref 43.0–77.0)
Platelets: 191 10*3/uL (ref 150.0–400.0)
RBC: 4.39 Mil/uL (ref 3.87–5.11)
RDW: 12.8 % (ref 11.5–15.5)
WBC: 3.9 10*3/uL — ABNORMAL LOW (ref 4.0–10.5)

## 2023-06-03 LAB — COMPREHENSIVE METABOLIC PANEL WITH GFR
ALT: 12 U/L (ref 0–35)
AST: 14 U/L (ref 0–37)
Albumin: 4.7 g/dL (ref 3.5–5.2)
Alkaline Phosphatase: 41 U/L (ref 39–117)
BUN: 15 mg/dL (ref 6–23)
CO2: 27 meq/L (ref 19–32)
Calcium: 9.2 mg/dL (ref 8.4–10.5)
Chloride: 106 meq/L (ref 96–112)
Creatinine, Ser: 0.65 mg/dL (ref 0.40–1.20)
GFR: 116.27 mL/min (ref >60.00)
Glucose, Bld: 78 mg/dL (ref 70–99)
Potassium: 4 meq/L (ref 3.5–5.1)
Sodium: 140 meq/L (ref 135–145)
Total Bilirubin: 0.3 mg/dL (ref 0.2–1.2)
Total Protein: 7.3 g/dL (ref 6.0–8.3)

## 2023-06-03 LAB — C-REACTIVE PROTEIN: CRP: <1 mg/dL (ref 0.5–20.0)

## 2023-06-03 LAB — IBC PANEL
Iron: 60 ug/dL (ref 42–145)
Saturation Ratios: 21.3 % (ref 20.0–50.0)
TIBC: 281.4 ug/dL (ref 250.0–450.0)
Transferrin: 201 mg/dL — ABNORMAL LOW (ref 212.0–360.0)

## 2023-06-03 LAB — FERRITIN: Ferritin: 55.8 ng/mL (ref 10.0–291.0)

## 2023-06-03 LAB — VITAMIN B12: Vitamin B-12: 394 pg/mL (ref 211–911)

## 2023-06-03 LAB — TSH: TSH: 2.43 u[IU]/mL (ref 0.35–5.50)

## 2023-06-03 LAB — SEDIMENTATION RATE: Sed Rate: 3 mm/h (ref 0–20)

## 2023-06-03 NOTE — Assessment & Plan Note (Signed)
 Hx of positive ANA, significant difficulty with what seems to be more of a systemic aspect.  I do think that this could be more neurogenic in pathology.  Will hold on a nerve conduction study at this point but do want to get laboratory workup to continue to monitor.  I think patient does have some hypermobility but I think it is more secondary to the lack of muscle strength at the moment.  Patient has had difficulty with eating as well that I think can potentially contribute as well.  Discussed icing regimen of home exercises.  Discussed increasing activity slowly.  Patient is more concerned about different things like mastocytosis.  Patient just is looking for a better quality of life.  Follow-up again in 6 to 8 weeks otherwise.

## 2023-06-03 NOTE — Patient Instructions (Addendum)
 Labs today [facility] food testing Want to see what labs say before we make any drastic measures See me again in 6 weeks

## 2023-06-06 ENCOUNTER — Ambulatory Visit: Attending: Physical Medicine and Rehabilitation

## 2023-06-06 DIAGNOSIS — R498 Other voice and resonance disorders: Secondary | ICD-10-CM | POA: Insufficient documentation

## 2023-06-06 DIAGNOSIS — R490 Dysphonia: Secondary | ICD-10-CM | POA: Diagnosis present

## 2023-06-06 LAB — ALLERGEN PANEL (27) + IGE
Alternaria Alternata IgE: 0.37 kU/L — AB
Aspergillus Fumigatus IgE: <0.1 kU/L
Bahia Grass IgE: <0.1 kU/L
Bermuda Grass IgE: 0.11 kU/L — AB
Cat Dander IgE: 0.22 kU/L — AB
Cedar, Mountain IgE: <0.1 kU/L
Cladosporium Herbarum IgE: <0.1 kU/L
Cocklebur IgE: <0.1 kU/L
Cockroach, American IgE: <0.1 kU/L
Common Silver Birch IgE: <0.1 kU/L
D Farinae IgE: 16.1 kU/L — AB
D Pteronyssinus IgE: 32.7 kU/L — AB
Dog Dander IgE: 3.43 kU/L — AB
Elm, American IgE: <0.1 kU/L
Hickory, White IgE: 1.11 kU/L — AB
IgE (Immunoglobulin E), Serum: 130 [IU]/mL (ref 6–495)
Johnson Grass IgE: <0.1 kU/L
Kentucky Bluegrass IgE: 0.67 kU/L — AB
Maple/Box Elder IgE: <0.1 kU/L
Mucor Racemosus IgE: <0.1 kU/L
Oak, White IgE: <0.1 kU/L
Penicillium Chrysogen IgE: <0.1 kU/L
Pigweed, Rough IgE: <0.1 kU/L
Plantain, English IgE: 0.79 kU/L — AB
Ragweed, Short IgE: <0.1 kU/L
Setomelanomma Rostrat: <0.1 kU/L
Timothy Grass IgE: 0.64 kU/L — AB
White Mulberry IgE: <0.1 kU/L

## 2023-06-06 LAB — PTH, INTACT AND CALCIUM
Calcium: 9.3 mg/dL (ref 8.6–10.2)
PTH: 29 pg/mL (ref 16–77)

## 2023-06-06 NOTE — Therapy (Signed)
 OUTPATIENT SPEECH LANGUAGE PATHOLOGY VOICE TREATMENT   Patient Name: Brittany [name] MRN: [id number] DOB:September 24, 1990, 33 y.o., female Today's Date: 06/06/2023  PCP: Alexander Iba, MD REFERRING PROVIDER: Laverle Postin, MD  END OF SESSION:  End of Session - 06/06/23 1534     Visit Number 4    Number of Visits 9   SLP preferred x2/week x2 weeks then x1/week x6 weeks but pt preferred x1/week x8 weeks   Date for SLP Re-Evaluation 07/01/23    SLP Start Time 1404    SLP Stop Time  1448    SLP Time Calculation (min) 44 min    Activity Tolerance Patient tolerated treatment well              Past Medical History:  Diagnosis Date   Ankle injury    Anxiety    Eating disorder    Frequent headaches    Past Surgical History:  Procedure Laterality Date   ANKLE SURGERY  2018   HERNIA REPAIR     WISDOM TOOTH EXTRACTION     Patient Active Problem List   Diagnosis Date Noted   Positive ANA (antinuclear antibody) 10/19/2019   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   Anorexia nervosa, restricting type, in full remission, moderate 02/16/2018   History of depression 02/16/2018   GAD (generalized anxiety disorder) 12/16/2017   Pelvic floor dysfunction 12/16/2017   Right hip pain 11/02/2017   Grade 2 ankle sprain, right, initial encounter 05/16/2016   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  Pt accompanied by: self  PERTINENT HISTORY: Pt had  course of ST with SLP - Thomas in 2023 and then 4 sessions of ST with ST- Morris at [facility] in January-March 2024. PMHx includes anxiety, functional movement disorder. Pt has an integrative medicine therapist who told her she is not breathing well and asked her to obtain a BREATHER device. Pt did so and requires assistance how to use properly. Pt's physiatrist wrote script for this reason.  PAIN:  Are you having pain? Yes: NPRS scale: 5 Pain location: rt flank, up to rt neck Pain description: extremely tense Aggravating factors: when things are sublexed Relieving factors:  self massage  FALLS: Has patient fallen in last 6 months? No  LIVING ENVIRONMENT: Lives with: lives with their family Lives in: House/apartment  PLOF:Level of assistance: Independent with ADLs, Independent with IADLs Employment: Full-time employment  PATIENT GOALS: Improve talking/singing, improve breath support  OBJECTIVE:  Note: Objective measures were completed at Evaluation unless otherwise noted.    PATIENT REPORTED OUTCOME MEASURES (PROM): Pt-reported that throat tightness can vary from day to day - pt unsure if she can scale tightness.  TREATMENT DATE:  AB = Abdominal breathing  06/06/23: Pt needs visualization for vocal relaxation next session. Vocal quality was again excellent today. Pt breathing with AB 85% of the time in 15 minutes conversation. SLP and pt discussed today ways to decr vocal tension during the day and pt return demonstrated. SLP used diagram of how to gradually warm up voice and to decr vocal tension. SLP also educated pt on vocal stamina and fatigue in the "red yellow green" framework and ascertained skillfully that pt goes "from green to red - there is no yellow" so SLP suggested pt start to focus on what is her "green-yellow" feeling and stop there.  Pt has  modified vocal exercises to suit her needs so there is not one vocal exercise that remains unmodified from [facility]. So LTG #3 d/c'd.  06/01/23: Excellent (WNL) voice quality today. Tiny reports she is "modifying" the vocal exercises at [facility] with some techniques of her own. SLP told pt to bring the exercises [facility] gave her and whatever aspects of those exercises SLP can verify their accuracy. Pt used AB in 8 minutes conversation today 90% of the time. SLP again told pt do NOT move intensity of BREATHER device prior to performing 10 reps BID x3 days at <70% effort. SLP provided rationale for this.  05/18/23: Needs to be asked about throat tightness next session. Pt communicated within first 5 minutes of session x2 that she was unsure she was performing HEP with BREATHER correctly. SLP observed pt with inhalation and exhalation and she was performing with excellent procedure.  Long discussion about why not advised to switch settings from set to set or from day to day - need to assess if effort level was under 70% and if so, switch setting to next highest number, and SLP provided rationale for this. Pt thanked SLP.  04/25/23: SLP assessed pt's performance with BREATHER device. SLP had to provide mod cues usually for proper procedure for inhalation and exhalation. Instruction occurred with shoulder position, head position, and when to increase resistance  PATIENT EDUCATION: Education details: see "treatment date" Person educated: Patient Education method: Explanation, Demonstration, and Verbal cues Education comprehension: verbalized understanding, returned demonstration, verbal cues required, and needs further education  HOME EXERCISE PROGRAM: BREATHER device, voice exercises pt finds helpful  GOALS: Goals reviewed with patient? Yes, in general  SHORT TERM GOALS: Target date: 05/27/23  Pt will use BREATHER device with proper procedure in 3 sessions Baseline: 05/18/23, 06/01/23 Goal  status: partially met  2.  Pt will complete vocal exercises with proper procedure with rare min A in 3 sessions Baseline:  Goal status: addressed 06/06/23, see LTGs  3.  Pt will use trained technique to reduce tension in throat (when tension present to decr vocal quality) by a scale point of 1 in scale 1-10 (10=most tension ever felt) Baseline:  Goal status: not addressed, modified and moved to LTG  4. Pt will use AB in 8 minutes conversation with 80% success in 3 sessions Baseline: 06/01/23 Goal status: modified based on 06/01/23, partially met   LONG TERM GOALS: Target date: 06/24/23  Pt will improve PROM from initial administration Baseline:  Goal status: INITIAL  2.  Pt will use BREATHER device with proper procedure in 2 sessions after 05/27/23 Baseline:  Goal status: INITIAL  3.  Pt will complete vocal exercises with proper procedure, with mod I, in 2 sessions Baseline:  Goal status: d/c - see note from 06/06/23  4.  In/between two sessions pt  will use/report use of trained technique/s to reduce tension in throat (when tension present to decr vocal quality), after 05/27/23 Baseline:  Goal status: modified due to pt report vdaily variance in level of tension makes difficult to scale  5.  Pt will demo knowledge of when to incr resistance of BREATHER, when asked (or observed) in 2 sessions Baseline:  Goal status: INITIAL   ASSESSMENT:  CLINICAL IMPRESSION: Patient is a 33 y.o. F who was seen today for treatment due to periodic tightness in throat when speaking making it difficult for her to talk and sing. Pt states this tightness is dependent upon her joints each day. Pt is amenable to continue with ST for breath support (use of proper technique with BREATHER), and use of visualization to relax throat. Voice exercises have been modified to suit pt's needs and LTG #3 was d/c'd today. At time of eval, pt's voice quality sounds very much WNL, but pt reports that voice quality is  intermittent, and decreases with sx of tight feeling in her throat. Today pt's voice quality was excellent. Given pt's current sx and report, SLP has no reason to believe any laryngeal structural abnormalities exist that would warrant follow up laryngeal imaging at this time. Certainly if this changed during treatment course, SLP will recommend strongly to referring MD to make a referral to ENT.   OBJECTIVE IMPAIRMENTS: include voice disorder. These impairments are limiting patient from household responsibilities, ADLs/IADLs, and effectively communicating at home and in community. Factors affecting potential to achieve goals and functional outcome are co-morbidities.. Patient will benefit from skilled SLP services to address above impairments and improve overall function.  REHAB POTENTIAL: Good  PLAN:  SLP FREQUENCY: 1x/week  SLP DURATION: 8 weeks (SLP desired x2/week x2 weeks then once/week x6 weeks but pt desired once/week x8 weeks)  PLANNED INTERVENTIONS: Cueing hierachy, Oral motor exercises, SLP instruction and feedback, Compensatory strategies, Patient/family education, and 84696 Treatment of speech (30 or 45 min)     Tyran Huser, CCC-SLP 06/06/2023, 3:35 PM

## 2023-06-07 ENCOUNTER — Other Ambulatory Visit: Payer: Self-pay

## 2023-06-07 ENCOUNTER — Ambulatory Visit: Payer: Self-pay | Admitting: Family Medicine

## 2023-06-07 DIAGNOSIS — R768 Other specified abnormal immunological findings in serum: Secondary | ICD-10-CM

## 2023-06-07 LAB — CYCLIC CITRUL PEPTIDE ANTIBODY, IGG: Cyclic Citrullin Peptide Ab: <16 U

## 2023-06-07 LAB — CALCIUM, IONIZED: Calcium, Ion: 5.1 mg/dL (ref 4.7–5.5)

## 2023-06-07 LAB — RPR: RPR Ser Ql: NONREACTIVE

## 2023-06-07 LAB — ANA: Anti Nuclear Antibody (ANA): POSITIVE — AB

## 2023-06-07 LAB — RHEUMATOID FACTOR: Rheumatoid fact SerPl-aCnc: <10 [IU]/mL (ref <14)

## 2023-06-07 LAB — ANTI-NUCLEAR AB-TITER (ANA TITER): ANA Titer 1: 1:320 {titer} — ABNORMAL HIGH

## 2023-06-07 LAB — ANGIOTENSIN CONVERTING ENZYME: Angiotensin-Converting Enzyme: 24 U/L (ref 9–67)

## 2023-06-09 ENCOUNTER — Encounter: Payer: Self-pay | Admitting: Physician Assistant

## 2023-06-13 ENCOUNTER — Ambulatory Visit: Admitting: Physician Assistant

## 2023-06-13 ENCOUNTER — Ambulatory Visit

## 2023-06-13 ENCOUNTER — Encounter: Payer: Self-pay | Admitting: Physician Assistant

## 2023-06-13 VITALS — BP 100/70 | HR 86 | Temp 98.4°F | Ht 64.0 in | Wt 115.2 lb

## 2023-06-13 DIAGNOSIS — R490 Dysphonia: Secondary | ICD-10-CM | POA: Diagnosis not present

## 2023-06-13 DIAGNOSIS — F411 Generalized anxiety disorder: Secondary | ICD-10-CM

## 2023-06-13 DIAGNOSIS — R498 Other voice and resonance disorders: Secondary | ICD-10-CM

## 2023-06-13 MED ORDER — ESCITALOPRAM OXALATE 20 MG PO TABS: 20.0000 mg | ORAL_TABLET | Freq: Every day | ORAL | 3 refills | Status: DC

## 2023-06-13 NOTE — Therapy (Addendum)
 OUTPATIENT SPEECH LANGUAGE PATHOLOGY VOICE TREATMENT   Patient Name: Brittany [name] MRN: [id number] DOB:[birth year], 33 y.o., female Today's Date: 06/13/2023  PCP: Brittany Iba, MD REFERRING PROVIDER: Laverle Postin, MD  END OF SESSION:  End of Session - 06/13/23 1507     Visit Number 5    Number of Visits 9   SLP preferred x2/week x2 weeks then x1/week x6 weeks but pt preferred x1/week x8 weeks   Date for SLP Re-Evaluation 07/01/23    SLP Start Time 1405    SLP Stop Time  1445    SLP Time Calculation (min) 40 min    Activity Tolerance Patient tolerated treatment well               Past Medical History:  Diagnosis Date   Ankle injury    Anxiety    Eating disorder    Frequent headaches    Past Surgical History:  Procedure Laterality Date   ANKLE SURGERY  2018   HERNIA REPAIR     WISDOM TOOTH EXTRACTION     Patient Active Problem List   Diagnosis Date Noted   Positive ANA (antinuclear antibody) 10/19/2019   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   Anorexia nervosa, restricting type, in full remission, moderate 02/16/2018   History of depression 02/16/2018   GAD (generalized anxiety disorder) 12/16/2017   Pelvic floor dysfunction 12/16/2017   Right hip pain 11/02/2017   Grade 2 ankle sprain, right, initial encounter 05/16/2016   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  Pt accompanied by: self  PERTINENT HISTORY: Pt had  course of ST with SLP - Brittany [name] in 2023 and then 4 sessions of ST with ST- Brittany [name] at [facility] in January-March 2024. PMHx includes anxiety, functional movement disorder. Pt has an integrative medicine therapist who told her she is not breathing well and asked her to obtain a BREATHER device. Pt did so and requires assistance how to use properly. Pt's physiatrist wrote script for this reason.  PAIN:  Are you having pain? Yes: NPRS scale: 5 Pain location: rt flank, up to rt neck Pain description: extremely tense Aggravating factors: when things are sublexed Relieving factors:  self massage  FALLS: Has patient fallen in last 6 months? No  LIVING ENVIRONMENT: Lives with: lives with their family Lives in: House/apartment  PLOF:Level of assistance: Independent with ADLs, Independent with IADLs Employment: Full-time employment  PATIENT GOALS: Improve talking/singing, improve breath support  OBJECTIVE:  Note: Objective measures were completed at Evaluation unless otherwise noted.    PATIENT REPORTED OUTCOME MEASURES (PROM): Pt-reported that throat tightness can vary from day to day - pt unsure if she can scale tightness.  TREATMENT DATE:  AB = Abdominal breathing  06/13/23: SLP educated pt on visualization. Pt preferred the term "peace" to "relax" due to negative situations with that word in the past. SLP asked pt what images came to mind with "peace" and pt thought of 5-6 of them including a waterfall, laying under a birch tree, and her husband. SLP also re-educated pt about when to move the BREATHER to a more challenging setting. Pt wanted to move to higher setting without achieving 3 days straight of <70% effort. SLP stressed she is building necessary muscle memory by not moving device up too soon.   06/06/23: Pt needs visualization for vocal relaxation next session.  Vocal quality was again excellent today. Pt breathing with AB 85% of the time in 15 minutes conversation. SLP and pt discussed today ways to decr vocal tension during the day and pt return demonstrated. SLP used diagram of how to gradually warm up voice and to decr vocal tension. SLP also educated pt on vocal stamina and fatigue in the "red yellow green" framework and ascertained skillfully that pt goes "from green to red - there is no yellow" so SLP suggested pt start to focus on what is her "green-yellow" feeling and stop there.  Pt has modified vocal exercises to suit her needs so there is not one vocal exercise that remains unmodified from [facility]. So LTG #3 d/c'd.  06/01/23: Excellent (WNL) voice quality today. Brittany [name] reports she is "modifying" the vocal exercises at [facility] with some techniques of her own. SLP told pt to bring the exercises [facility] gave her and whatever aspects of those exercises SLP can verify their accuracy. Pt used AB in 8 minutes conversation today 90% of the time. SLP again told pt do NOT move intensity of BREATHER device prior to performing 10 reps BID x3 days at <70% effort. SLP provided rationale for this.  05/18/23: Needs to be asked about throat tightness next session. Pt communicated within first 5 minutes of session x2 that she was unsure she was performing HEP with BREATHER correctly. SLP observed pt with inhalation and exhalation and she was performing with excellent procedure.  Long discussion about why not advised to switch settings from set to set or from day to day - need to assess if effort level was under 70% and if so, switch setting to next highest number, and SLP provided rationale for this. Pt thanked SLP.  04/25/23: SLP assessed pt's performance with BREATHER device. SLP had to provide mod cues usually for proper procedure for inhalation and exhalation. Instruction occurred with shoulder position, head position, and when to increase resistance  PATIENT  EDUCATION: Education details: see "treatment date" Person educated: Patient Education method: Explanation, Demonstration, and Verbal cues Education comprehension: verbalized understanding, returned demonstration, verbal cues required, and needs further education  HOME EXERCISE PROGRAM: BREATHER device, voice exercises pt finds helpful  GOALS: Goals reviewed with patient? Yes, in general  SHORT TERM GOALS: Target date: 05/27/23  Pt will use BREATHER device with proper procedure in 3 sessions Baseline: 05/18/23, 06/01/23 Goal status: met  2.  Pt will complete vocal exercises with proper procedure with rare min A in 3 sessions Baseline:  Goal status: addressed 06/06/23, see LTGs  3.  Pt will use trained technique to reduce tension in throat (when tension present to decr vocal quality) by a scale point of 1 in scale 1-10 (10=most tension ever felt) Baseline:  Goal status: not addressed, modified and moved to LTG  4. Pt will use  AB in 8 minutes conversation with 80% success in 3 sessions Baseline: 06/01/23, 06/13/23 Goal status: modified based on 06/01/23, partially met   LONG TERM GOALS: Target date: 06/24/23  Pt will improve PROM from initial administration Baseline:  Goal status: INITIAL  2.  Pt will use BREATHER device with proper procedure in 2 sessions after 05/27/23 Baseline:  Goal status: INITIAL  3.  Pt will complete vocal exercises with proper procedure, with mod I, in 2 sessions Baseline:  Goal status: d/c - see note from 06/06/23  4.  In/between two sessions pt will use/report use of trained technique/s to reduce tension in throat (when tension present to decr vocal quality), after 05/27/23 Baseline:  Goal status: modified due to pt report vdaily variance in level of tension makes difficult to scale  5.  Pt will demo knowledge of when to incr resistance of BREATHER, when asked (or observed) in 2 sessions Baseline:  Goal status: INITIAL   ASSESSMENT:  CLINICAL  IMPRESSION: STG #1 met today. Patient is a 33 y.o. F who was seen today for treatment due to periodic tightness in throat when speaking making it difficult for her to talk and sing. Pt states this tightness is dependent upon her joints each day. Pt is amenable to continue with ST for breath support (use of proper technique with BREATHER), and use of visualization to relax throat. At time of eval, pt's voice quality sounds very much WNL, but pt reports that voice quality is intermittent, and decreases with sx of tight feeling in her throat. Today pt's voice quality was excellent. Given pt's current sx and report, SLP has no reason to believe any laryngeal structural abnormalities exist that would warrant follow up laryngeal imaging at this time. Certainly if this changed during treatment course, SLP will recommend strongly to referring MD to make a referral to ENT.   OBJECTIVE IMPAIRMENTS: include voice disorder. These impairments are limiting patient from household responsibilities, ADLs/IADLs, and effectively communicating at home and in community. Factors affecting potential to achieve goals and functional outcome are co-morbidities.. Patient will benefit from skilled SLP services to address above impairments and improve overall function.  REHAB POTENTIAL: Good  PLAN:  SLP FREQUENCY: 1x/week  SLP DURATION: 8 weeks (SLP desired x2/week x2 weeks then once/week x6 weeks but pt desired once/week x8 weeks)  PLANNED INTERVENTIONS: Cueing hierachy, Oral motor exercises, SLP instruction and feedback, Compensatory strategies, Patient/family education, and 16109 Treatment of speech (30 or 45 min)     Cephas Revard, CCC-SLP 06/13/2023, 3:07 PM

## 2023-06-13 NOTE — Progress Notes (Signed)
 Brittany [name] Brittany [name] is a 33 y.o. female here for a follow up of a pre-existing problem.  History of Present Illness:   Chief Complaint  Patient presents with   Medication Refill   Anxiety    HPI  GAD Pt is on Lexapro 20 mg daily for anxiety. Pt notes significant clinical effect when she takes it. Last August when she ran out of Lexapro, pt states she experienced withdrawals. She reports had full body spasms to the point she could no longer drive and that her husband would have to hold her. She notes she experiences these spasms when stressed.  Pt also reports she has been exploring different therapies and treatments for her chronic issues (mental health, breathing, body movement). She notes [facility], which she was recommended by an EDS support group, and Dr. Orie Birchwood in [place]. She also notes she has been consistently seeing someone since 2022 and states he has been educating her on her mental health. She has a referral for rheumatology, but notes there are no open appts until December.  Past Medical History:  Diagnosis Date   Ankle injury    Anxiety    Eating disorder    Frequent headaches      Social History   Tobacco Use   Smoking status: Never   Smokeless tobacco: Never  Vaping Use   Vaping status: Never Used  Substance Use Topics   Alcohol use: No    Alcohol/week: 0.0 standard drinks of alcohol   Drug use: No    Past Surgical History:  Procedure Laterality Date   ANKLE SURGERY  2018   HERNIA REPAIR     WISDOM TOOTH EXTRACTION      Family History  Problem Relation Age of Onset   Depression Mother    Anxiety disorder Mother    Depression Brother    Anxiety disorder Brother    Diabetes Maternal Grandmother    Hearing loss Maternal Grandmother    Alcohol abuse Maternal Grandfather    Depression Maternal Grandfather    Diabetes Maternal Grandfather    Dementia Paternal Grandfather    Thyroid  disease Brother     Allergies  Allergen  Reactions   Gabapentin Other (See Comments)    Vertigo    Current Medications:   Current Outpatient Medications:    acetaminophen (TYLENOL) 325 MG tablet, Take by mouth., Disp: , Rfl:    magnesium citrate SOLN, Take by mouth., Disp: , Rfl:    desipramine (NORPRAMIN) 50 MG tablet, Take 50 mg by mouth daily., Disp: , Rfl:    escitalopram (LEXAPRO) 20 MG tablet, Take 1 tablet (20 mg total) by mouth daily., Disp: 90 tablet, Rfl: 3   Review of Systems:   Negative unless otherwise specified per HPI.  Vitals:   Vitals:   06/13/23 1034  BP: 100/70  Pulse: 86  Temp: 98.4 F (36.9 C)  TempSrc: Temporal  SpO2: 98%  Weight: 115 lb 4 oz (52.3 kg)  Height: 5\' 4"  (1.626 m)     Body mass index is 19.78 kg/m.  Physical Exam:   Physical Exam Vitals and nursing note reviewed.  Constitutional:      General: She is not in acute distress.    Appearance: She is well-developed. She is not ill-appearing or toxic-appearing.  Cardiovascular:     Rate and Rhythm: Normal rate and regular rhythm.     Pulses: Normal pulses.     Heart sounds: Normal heart sounds, S1 normal and S2 normal.  Pulmonary:  Effort: Pulmonary effort is normal.     Breath sounds: Normal breath sounds.  Skin:    General: Skin is warm and dry.  Neurological:     Mental Status: She is alert.     GCS: GCS eye subscore is 4. GCS verbal subscore is 5. GCS motor subscore is 6.  Psychiatric:        Speech: Speech normal.        Behavior: Behavior normal. Behavior is cooperative.     Assessment and Plan:   1. GAD (generalized anxiety disorder) (Primary)  Doing well Continue Lexapro 20 mg daily Follow up in 1 year, sooner if concerns I discussed with patient that if they develop any SI, to tell someone immediately and seek medical attention. Continue to see care with ongoing therapists as able  I, Timoteo Force, acting as a Neurosurgeon for [employer], [place]., have documented all relevant documentation on the behalf of  Alexander Iba, [place], as directed by  Alexander Iba, PA while in the presence of Alexander Iba, [place].  I, Alexander Iba, [place], have reviewed all documentation for this visit. The documentation on 06/13/23 for the exam, diagnosis, procedures, and orders are all accurate and complete.  Alexander Iba, PA-C

## 2023-06-21 ENCOUNTER — Ambulatory Visit

## 2023-06-21 DIAGNOSIS — R490 Dysphonia: Secondary | ICD-10-CM

## 2023-06-21 DIAGNOSIS — R498 Other voice and resonance disorders: Secondary | ICD-10-CM

## 2023-06-21 NOTE — Therapy (Signed)
 OUTPATIENT SPEECH LANGUAGE PATHOLOGY VOICE TREATMENT   Patient Name: Brittany [name] MRN: [id number] DOB:11/28/1990, 33 y.o., female Today's Date: 06/21/2023  PCP: Alexander Iba, MD REFERRING PROVIDER: Laverle Postin, MD  END OF SESSION:  End of Session - 06/21/23 2255     Visit Number 6    Number of Visits 9    Date for SLP Re-Evaluation 07/01/23    SLP Start Time 1318    SLP Stop Time  1402    SLP Time Calculation (min) 44 min    Activity Tolerance Patient tolerated treatment well             Past Medical History:  Diagnosis Date   Ankle injury    Anxiety    Eating disorder    Frequent headaches    Past Surgical History:  Procedure Laterality Date   ANKLE SURGERY  2018   HERNIA REPAIR     WISDOM TOOTH EXTRACTION     Patient Active Problem List   Diagnosis Date Noted   Positive ANA (antinuclear antibody) 10/19/2019   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   Anorexia nervosa, restricting type, in full remission, moderate 02/16/2018   History of depression 02/16/2018   GAD (generalized anxiety disorder) 12/16/2017   Pelvic floor dysfunction 12/16/2017   Right hip pain 11/02/2017   Grade 2 ankle sprain, right, initial encounter 05/16/2016   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   Right ankle pain 03/03/2015   IBS (irritable bowel syndrome) 05/25/2009    Onset date: script dated 03/31/23  REFERRING DIAG:  R09.89 (ICD-10-CM) - Throat tightness    THERAPY DIAG:  Other voice and resonance disorders  Dysphonia  Rationale for Evaluation and Treatment: Rehabilitation  SUBJECTIVE:   SUBJECTIVE STATEMENT: Jesenya's voice sounds WNL today.  Pt accompanied by: self  PERTINENT HISTORY: Pt had course of ST with SLP - Thomas in 2023 and then 4 sessions of ST with ST- Morris at [facility] in January-March 2024. PMHx includes anxiety, functional movement disorder. Pt has an integrative medicine therapist  who told her she is not breathing well and asked her to obtain a BREATHER device. Pt did so and requires assistance how to use properly. Pt's physiatrist wrote script for this reason.  PAIN:  Are you having pain? Yes: NPRS scale: 6 Pain location: rt shoulder & clavicle Pain description: extremely tense Aggravating factors: when things are sublexed Relieving factors:  self massage  FALLS: Has patient fallen in last 6 months? No  PATIENT GOALS: Improve talking/singing, improve breath support  OBJECTIVE:  Note: Objective measures were completed at Evaluation unless otherwise noted.    PATIENT REPORTED OUTCOME MEASURES (PROM): Pt-reported that throat tightness can vary from day to day - pt unsure if she can scale tightness.                                                                                                                            TREATMENT DATE:  AB = Abdominal breathing  06/21/23: Pt remarked that she has taken visualization of peace as suggested by SLP prevous session and incorporated these into a framework that works for her, when feeling tightness and tension in her throat. Today, pt wanted to move both inhale and exhale up on her Breather devie and with further min discussion with SLP only moved inhale up. She demonstrated good problem solving strategy today for whether or not to move resistance up or not. Discussion about SLP having almost exhausted skill he has with pt. Vivi stated she did not want to be discharged until she was comfortable with doing things correctly. SLP explained that it would be surprising to SLP if pt were seen <10 visits and explained rationale for this.  06/13/23: SLP educated pt on visualization. Pt preferred the term peace to relax due to negative situations with that word in the past. SLP asked pt what images came to mind with peace and pt thought of 5-6 of them including a waterfall, laying under a birch tree, and her husband. SLP also  re-educated pt about when to move the BREATHER to a more challenging setting. Pt wanted to move to higher setting without achieving 3 days straight of <70% effort. SLP stressed she is building necessary muscle memory by not moving device up too soon.   06/06/23: Pt needs visualization for vocal relaxation next session. Vocal quality was again excellent today. Pt breathing with AB 85% of the time in 15 minutes conversation. SLP and pt discussed today ways to decr vocal tension during the day and pt return demonstrated. SLP used diagram of how to gradually warm up voice and to decr vocal tension. SLP also educated pt on vocal stamina and fatigue in the red yellow green framework and ascertained skillfully that pt goes from green to red - there is no yellow so SLP suggested pt start to focus on what is her green-yellow feeling and stop there.  Pt has modified vocal exercises to suit her needs so there is not one vocal exercise that remains unmodified from [facility]. So LTG #3 d/c'd.  06/01/23: Excellent (WNL) voice quality today. Reyah reports she is modifying the vocal exercises at [facility] with some techniques of her own. SLP told pt to bring the exercises [facility] gave her and whatever aspects of those exercises SLP can verify their accuracy. Pt used AB in 8 minutes conversation today 90% of the time. SLP again told pt do NOT move intensity of BREATHER device prior to performing 10 reps BID x3 days at <70% effort. SLP provided rationale for this.  05/18/23: Needs to be asked about throat tightness next session. Pt communicated within first 5 minutes of session x2 that she was unsure she was performing HEP with BREATHER correctly. SLP observed pt with inhalation and exhalation and she was performing with excellent procedure.  Long discussion about why not advised to switch settings from set to set or from day to day - need to assess if effort level was under 70% and if so, switch setting to next  highest number, and SLP provided rationale for this. Pt thanked SLP.  04/25/23: SLP assessed pt's performance with BREATHER device. SLP had to provide mod cues usually for proper procedure for inhalation and exhalation. Instruction occurred with shoulder position, head position, and when to increase resistance  PATIENT EDUCATION: Education details: see treatment date Person educated: Patient Education method: Explanation, Demonstration, and Verbal cues Education comprehension: verbalized understanding, returned demonstration, verbal cues required, and needs  further education  HOME EXERCISE PROGRAM: BREATHER device, voice exercises pt finds helpful  GOALS: Goals reviewed with patient? Yes, in general  SHORT TERM GOALS: Target date: 05/27/23  Pt will use BREATHER device with proper procedure in 3 sessions Baseline: 05/18/23, 06/01/23 Goal status: met  2.  Pt will complete vocal exercises with proper procedure with rare min A in 3 sessions Baseline:  Goal status: addressed 06/06/23, see LTGs  3.  Pt will use trained technique to reduce tension in throat (when tension present to decr vocal quality) by a scale point of 1 in scale 1-10 (10=most tension ever felt) Baseline:  Goal status: not addressed, modified and moved to LTG  4. Pt will use AB in 8 minutes conversation with 80% success in 3 sessions Baseline: 06/01/23, 06/13/23 Goal status: modified based on 06/01/23, partially met   LONG TERM GOALS: Target date: 06/24/23  Pt will improve PROM from initial administration Baseline:  Goal status: INITIAL  2.  Pt will use BREATHER device with proper procedure in 2 sessions after 05/27/23 Baseline: 06/21/23 Goal status: INITIAL  3.  Pt will complete vocal exercises with proper procedure, with mod I, in 2 sessions Baseline:  Goal status: d/c - see note from 06/06/23  4.  In/between two sessions pt will use/report use of trained technique/s to reduce tension in throat (when tension present  to decr vocal quality), after 05/27/23 Baseline:  Goal status: modified due to pt report vdaily variance in level of tension makes difficult to scale  5.  Pt will demo knowledge of when to incr resistance of BREATHER, when asked (or observed) in 2 sessions Baseline: 06/21/23 Goal status: INITIAL   ASSESSMENT:  CLINICAL IMPRESSION: STG #1 met today. Patient is a 33 y.o. F who was seen today for treatment due to periodic tightness in throat when speaking making it difficult for her to talk and sing. Pt states this tightness is dependent upon her joints each day. Pt is amenable to continue with ST for breath support (use of proper technique with BREATHER), and use of visualization to relax throat. At time of eval, pt's voice quality sounds very much WNL, but pt reports that voice quality is intermittent, and decreases with sx of tight feeling in her throat. Today pt's voice quality was excellent. Given pt's current sx and report, SLP has no reason to believe any laryngeal structural abnormalities exist that would warrant follow up laryngeal imaging at this time. Certainly if this changed during treatment course, SLP will recommend strongly to referring MD to make a referral to ENT.   OBJECTIVE IMPAIRMENTS: include voice disorder. These impairments are limiting patient from household responsibilities, ADLs/IADLs, and effectively communicating at home and in community. Factors affecting potential to achieve goals and functional outcome are co-morbidities.. Patient will benefit from skilled SLP services to address above impairments and improve overall function.  REHAB POTENTIAL: Good  PLAN:  SLP FREQUENCY: 1x/week  SLP DURATION: 8 weeks (SLP desired x2/week x2 weeks then once/week x6 weeks but pt desired once/week x8 weeks)  PLANNED INTERVENTIONS: Cueing hierachy, Oral motor exercises, SLP instruction and feedback, Compensatory strategies, Patient/family education, and [address] Treatment of speech (30  or 45 min)     Margerite Impastato, CCC-SLP 06/21/2023, 10:56 PM

## 2023-06-26 ENCOUNTER — Encounter: Payer: Self-pay | Admitting: Family Medicine

## 2023-06-28 ENCOUNTER — Ambulatory Visit

## 2023-06-28 DIAGNOSIS — R490 Dysphonia: Secondary | ICD-10-CM

## 2023-06-28 DIAGNOSIS — R498 Other voice and resonance disorders: Secondary | ICD-10-CM

## 2023-06-28 NOTE — Therapy (Signed)
 OUTPATIENT SPEECH LANGUAGE PATHOLOGY VOICE TREATMENT/RECERTIFICATION   Patient Name: Brittany [name] MRN: [id number] DOB:1990-01-19, 33 y.o., female Today's Date: 06/28/2023  PCP: Job Lukes, MD REFERRING PROVIDER: Lorilee Railing, MD  END OF SESSION:  End of Session - 06/28/23 1328     Visit Number 7    Number of Visits 15    Date for SLP Re-Evaluation 09/09/23   due to next ST session 07/12/23   SLP Start Time 1323   pt checked in 1321   SLP Stop Time  1400    SLP Time Calculation (min) 37 min    Activity Tolerance Patient tolerated treatment well             Past Medical History:  Diagnosis Date   Ankle injury    Anxiety    Eating disorder    Frequent headaches    Past Surgical History:  Procedure Laterality Date   ANKLE SURGERY  2018   HERNIA REPAIR     WISDOM TOOTH EXTRACTION     Patient Active Problem List   Diagnosis Date Noted   Positive ANA (antinuclear antibody) 10/19/2019   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   Anorexia nervosa, restricting type, in full remission, moderate 02/16/2018   History of depression 02/16/2018   GAD (generalized anxiety disorder) 12/16/2017   Pelvic floor dysfunction 12/16/2017   Right hip pain 11/02/2017   Grade 2 ankle sprain, right, initial encounter 05/16/2016   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   Right ankle pain 03/03/2015   IBS (irritable bowel syndrome) 05/25/2009   Speech Therapy Progress Note  Dates of Reporting Period: 04/25/23 to present  Subjective Statement: Pt has been seen for ST targeting visualization of relaxation, voice exercises, and proper procedure with BREATHER device.  Objective: Goal for other voice exercises was d/c'd due to pt incorporating what she feels is applicable for her and adding to her repertoire of her own models of voice exercises. She has made progress with knowing when to incr BREATHER device settings, and using  visualization although these are ongoing goals.   Goal Update: see below  Plan: see pt for no more than 8 more sessions  Reason Skilled Services are Required: pt has not fully demonstrated knowledge about when to incr difficulty with BREATHER device and requires further guidance on decr'ing tension in throat/laryngeal area using visualization.    Onset date: script dated 03/31/23  REFERRING DIAG:  R09.89 (ICD-10-CM) - Throat tightness    THERAPY DIAG:  Dysphonia  Other voice and resonance disorders  Rationale for Evaluation and Treatment: Rehabilitation  SUBJECTIVE:   SUBJECTIVE STATEMENT: Brittany [name]'s voice sounds WNL today. Reports she had a panic attack last night and voice and everything was different.  Pt accompanied by: self  PERTINENT HISTORY: Pt had course of ST with SLP - Thomas in 2023 and then 4 sessions of ST with ST- Morris at [facility] in January-March 2024. PMHx includes anxiety, functional movement disorder. Pt has an integrative medicine therapist who told her she is not breathing well and asked her to obtain a BREATHER device. Pt did so and requires assistance how to use properly. Pt's physiatrist wrote script for this reason.  PAIN:  Are you having pain? Yes: NPRS scale: 4/10, 2/10 Pain location: abdomen, throat Pain description: heavy and continuous, tense Aggravating factors: exercise or exercise of pelvic floor or eating or constipation or deep breathing, increased tension through use or joints further out of place  Relieving factors: not constipated  or gentle deep breathing or light pressure or curling up or distraction,self massage  FALLS: Has patient fallen in last 6 months? No  PATIENT GOALS: Improve talking/singing, improve breath support  OBJECTIVE:  Note: Objective measures were completed at Evaluation unless otherwise noted.    PATIENT REPORTED OUTCOME MEASURES (PROM): Pt-reported that throat tightness can vary from day to day - pt  unsure if she can scale tightness.                                                                                                                            TREATMENT DATE:  AB = Abdominal breathing  06/28/23: Pt will need to scale throat tension next week. She got work schedule for July yesterday. Pt will make appointments as close to once/week as she can get. SLP encouraged pt to think about this time as good practice for after discharge as she will likely not be able to schedule once/week frequency for about 4 weeks. Today pt used visualization to decr a little bit some tension in her throat and neck. She did this without cues from SLP. Lastly she performed her EMST inhale and exhale with independence. SLP to talk with pt next visit and inquire other than relaxation strategies/techniques for laryngeal area and neck what would be another area SLP could give pt skills to incr her independence.  06/21/23: Pt remarked that she has taken visualization of peace as suggested by SLP prevous session and incorporated these into a framework that works for her, when feeling tightness and tension in her throat. Today, pt wanted to move both inhale and exhale up on her Breather devie and with further min discussion with SLP only moved inhale up. She demonstrated good problem solving strategy today for whether or not to move resistance up or not. Discussion about SLP having almost exhausted skill he has with pt. Brittany [name] stated she did not want to be discharged until she was comfortable with doing things correctly. SLP explained that it would be surprising to SLP if pt were seen <10 visits and explained rationale for this.  06/13/23: SLP educated pt on visualization. Pt preferred the term peace to relax due to negative situations with that word in the past. SLP asked pt what images came to mind with peace and pt thought of 5-6 of them including a waterfall, laying under a birch tree, and her husband. SLP also  re-educated pt about when to move the BREATHER to a more challenging setting. Pt wanted to move to higher setting without achieving 3 days straight of <70% effort. SLP stressed she is building necessary muscle memory by not moving device up too soon.   06/06/23: Pt needs visualization for vocal relaxation next session. Vocal quality was again excellent today. Pt breathing with AB 85% of the time in 15 minutes conversation. SLP and pt discussed today ways to decr vocal tension during the day and pt return demonstrated. SLP used diagram of how to  gradually warm up voice and to decr vocal tension. SLP also educated pt on vocal stamina and fatigue in the red yellow green framework and ascertained skillfully that pt goes from green to red - there is no yellow so SLP suggested pt start to focus on what is her green-yellow feeling and stop there.  Pt has modified vocal exercises to suit her needs so there is not one vocal exercise that remains unmodified from [facility]. So LTG #3 d/c'd.  06/01/23: Excellent (WNL) voice quality today. Brittany [name] reports she is modifying the vocal exercises at [facility] with some techniques of her own. SLP told pt to bring the exercises [facility] gave her and whatever aspects of those exercises SLP can verify their accuracy. Pt used AB in 8 minutes conversation today 90% of the time. SLP again told pt do NOT move intensity of BREATHER device prior to performing 10 reps BID x3 days at <70% effort. SLP provided rationale for this.  05/18/23: Needs to be asked about throat tightness next session. Pt communicated within first 5 minutes of session x2 that she was unsure she was performing HEP with BREATHER correctly. SLP observed pt with inhalation and exhalation and she was performing with excellent procedure.  Long discussion about why not advised to switch settings from set to set or from day to day - need to assess if effort level was under 70% and if so, switch setting to next  highest number, and SLP provided rationale for this. Pt thanked SLP.  04/25/23: SLP assessed pt's performance with BREATHER device. SLP had to provide mod cues usually for proper procedure for inhalation and exhalation. Instruction occurred with shoulder position, head position, and when to increase resistance  PATIENT EDUCATION: Education details: see treatment date Person educated: Patient Education method: Explanation, Demonstration, and Verbal cues Education comprehension: verbalized understanding, returned demonstration, verbal cues required, and needs further education  HOME EXERCISE PROGRAM: BREATHER device, voice exercises pt finds helpful  GOALS: Goals reviewed with patient? Yes, in general  SHORT TERM GOALS: Target date: 05/27/23  Pt will use BREATHER device with proper procedure in 3 sessions Baseline: 05/18/23, 06/01/23 Goal status: met  2.  Pt will complete vocal exercises with proper procedure with rare min A in 3 sessions Baseline:  Goal status: addressed 06/06/23, see LTGs  3.  Pt will use trained technique to reduce tension in throat (when tension present to decr vocal quality) by a scale point of 1 in scale 1-10 (10=most tension ever felt) Baseline:  Goal status: not addressed, modified and moved to LTG  4. Pt will use AB in 8 minutes conversation with 80% success in 3 sessions Baseline: 06/01/23, 06/13/23 Goal status: modified based on 06/01/23, partially met   LONG TERM GOALS: Target date: 06/24/23  Pt will improve PROM from initial administration Baseline:  Goal status: not met and continue  2.  Pt will use BREATHER device with proper procedure in 2 sessions after 05/27/23 Baseline: 06/21/23 Goal status: met  3.  Pt will complete vocal exercises with proper procedure, with mod I, in 2 sessions Baseline:  Goal status: d/c - see note from 06/06/23  4.  In/between two sessions pt will use/report use of trained technique/s to reduce tension in throat, after  05/27/23 Baseline: 06/28/23 Goal status: partially met   5.  Pt will demo knowledge of when to incr resistance of BREATHER, when asked (or observed) in 2 sessions Baseline: 06/21/23 Goal status: partially met and continue  6.  In/between two sessions  pt will use/report use of trained technique/s to reduce tension in throat down one point on a pt-based scale of tension Baseline:  Goal status: INITIAL   ASSESSMENT:  CLINICAL IMPRESSION: RECERT TODAY: SLP added LTG #6. Patient is a 33 y.o. F who was seen today for treatment due to periodic tightness in throat when speaking making it difficult for her to talk and sing. Pt states this tightness is dependent upon her joints each day. Today pt was able to use visualization for 2 minutes to decr throat tension a little. Pt's voice quality continues as good to excellent in therapy sessions. At time of eval, pt's voice quality sounded very much WNL, but pt reports that voice quality is intermittent, and decreases with sx of tight feeling in her throat. Given pt's current sx and report, SLP has no reason to believe any laryngeal structural abnormalities exist that would warrant follow up laryngeal imaging at this time. Certainly if this changed during treatment course, SLP will recommend strongly to referring MD to make a referral to ENT.   OBJECTIVE IMPAIRMENTS: include voice disorder. These impairments are limiting patient from household responsibilities, ADLs/IADLs, and effectively communicating at home and in community. Factors affecting potential to achieve goals and functional outcome are co-morbidities.. Patient will benefit from skilled SLP services to address above impairments and improve overall function.  REHAB POTENTIAL: Good  PLAN:  SLP FREQUENCY: 1x/week  SLP DURATION: 8 weeks (SLP desired x2/week x2 weeks then once/week x6 weeks but pt desired once/week x8 weeks)  PLANNED INTERVENTIONS: Cueing hierachy, Oral motor exercises, SLP  instruction and feedback, Compensatory strategies, Patient/family education, and [id number] Treatment of speech (30 or 45 min)     Brittany [name], CCC-SLP 06/28/2023, 2:38 PM

## 2023-07-12 ENCOUNTER — Ambulatory Visit: Attending: Physical Medicine and Rehabilitation

## 2023-07-12 DIAGNOSIS — R490 Dysphonia: Secondary | ICD-10-CM | POA: Insufficient documentation

## 2023-07-12 DIAGNOSIS — R498 Other voice and resonance disorders: Secondary | ICD-10-CM | POA: Diagnosis present

## 2023-07-12 NOTE — Therapy (Signed)
 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   Anorexia nervosa, restricting type, in full remission, moderate 02/16/2018   History of depression 02/16/2018   GAD (generalized anxiety disorder) 12/16/2017   Pelvic floor dysfunction 12/16/2017   Right hip pain 11/02/2017   Grade 2 ankle sprain, right, initial encounter 05/16/2016   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   Right ankle pain 03/03/2015   IBS (irritable bowel syndrome) 05/25/2009   Speech Therapy Progress Note  Dates of Reporting Period: 04/25/23 to present  Subjective Statement: Pt has been seen for ST targeting visualization of relaxation, voice exercises, and proper procedure with BREATHER device.  Objective: Goal for other voice exercises was d/c'd due to pt incorporating what she feels is applicable for her and adding to her repertoire of her own models of voice exercises. She has made progress with knowing when to incr BREATHER device settings, and using visualization although these are ongoing goals.   Goal Update: see below  Plan: see pt for no more than 8 more sessions  Reason Skilled Services are Required: pt has not fully demonstrated knowledge about when to incr difficulty with BREATHER device and requires further guidance on decr'ing tension in throat/laryngeal area  using visualization.    Onset date: script dated 03/31/23  REFERRING DIAG:  R09.89 (ICD-10-CM) - Throat tightness    THERAPY DIAG:  Other voice and resonance disorders  Rationale for Evaluation and Treatment: Rehabilitation  SUBJECTIVE:   SUBJECTIVE STATEMENT: Brittany [name]'s voice sounds WNL today. Reports she had a panic attack last night and voice and everything was different.  Pt accompanied by: self  PERTINENT HISTORY: Pt had course of ST with SLP - Thomas in 2023 and then 4 sessions of ST with ST- [facility] at [facility] in January-March 2024. PMHx includes anxiety, functional movement disorder. Pt has an integrative medicine therapist who told her she is not breathing well and asked her to obtain a BREATHER device. Pt did so and requires assistance how to use properly. Pt's physiatrist wrote script for this reason.  PAIN:  Are you having pain? Yes: NPRS scale: 4/10, 2/10 Pain location: abdomen, throat Pain description: heavy and continuous, tense Aggravating factors: exercise or exercise of pelvic floor or eating or constipation or deep breathing, increased tension through use or joints further out of place  Relieving factors: not constipated or gentle deep breathing or light pressure or curling up or distraction,self massage  FALLS: Has patient fallen in last 6 months? No  PATIENT GOALS: Improve talking/singing, improve breath support  OBJECTIVE:  Note: Objective measures were completed at Evaluation unless otherwise noted.    PATIENT REPORTED OUTCOME MEASURES (PROM): Pt-reported that throat tightness can vary from day to day - pt unsure if she can scale tightness.  TREATMENT DATE:  AB = Abdominal breathing  07/12/23: Pt's last time where she felt tight enough where she has not wanted to talk was a long time ago. Tension in laryngeal area is such  that she feels she cannot project well. Brittany [name] reported one situation she was able to project at work that she felt was successful/baseline since previous session. Today she completed inhalation and exhalation correctly despite feeling like one side of her diaphragm was not expanding as much as the other side. Visually, pt diaphragm area appeared WNL for inhalation and exhalation. Pt is using techniques to decr tension in throat during the week.    06/28/23: Pt will need to scale throat tension next week. She got work schedule for July yesterday. Pt will make appointments as close to once/week as she can get. SLP encouraged pt to think about this time as good practice for after discharge as she will likely not be able to schedule once/week frequency for about 4 weeks. Today pt used visualization to decr a little bit some tension in her throat and neck. She did this without cues from SLP. Lastly she performed her EMST inhale and exhale with independence. SLP to talk with pt next visit and inquire other than relaxation strategies/techniques for laryngeal area and neck what would be another area SLP could give pt skills to incr her independence.  06/21/23: Pt remarked that she has taken visualization of peace as suggested by SLP prevous session and incorporated these into a framework that works for her, when feeling tightness and tension in her throat. Today, pt wanted to move both inhale and exhale up on her Breather devie and with further min discussion with SLP only moved inhale up. She demonstrated good problem solving strategy today for whether or not to move resistance up or not. Discussion about SLP having almost exhausted skill he has with pt. Brittany [name] stated she did not want to be discharged until she was comfortable with doing things correctly. SLP explained that it would be surprising to SLP if pt were seen <10 visits and explained rationale for this.  06/13/23: SLP educated pt on visualization. Pt  preferred the term peace to relax due to negative situations with that word in the past. SLP asked pt what images came to mind with peace and pt thought of 5-6 of them including a waterfall, laying under a birch tree, and her husband. SLP also re-educated pt about when to move the BREATHER to a more challenging setting. Pt wanted to move to higher setting without achieving 3 days straight of <70% effort. SLP stressed she is building necessary muscle memory by not moving device up too soon.   06/06/23: Pt needs visualization for vocal relaxation next session. Vocal quality was again excellent today. Pt breathing with AB 85% of the time in 15 minutes conversation. SLP and pt discussed today ways to decr vocal tension during the day and pt return demonstrated. SLP used diagram of how to gradually warm up voice and to decr vocal tension. SLP also educated pt on vocal stamina and fatigue in the red yellow green framework and ascertained skillfully that pt goes from green to red - there is no yellow so SLP suggested pt start to focus on what is her green-yellow feeling and stop there.  Pt has modified vocal exercises to suit her needs so there is not one vocal exercise that remains unmodified from [facility]. So LTG #3 d/c'd.  06/01/23: Excellent (WNL) voice quality today. Brittany [name] reports she  is modifying the vocal exercises at [facility] with some techniques of her own. SLP told pt to bring the exercises [facility] gave her and whatever aspects of those exercises SLP can verify their accuracy. Pt used AB in 8 minutes conversation today 90% of the time. SLP again told pt do NOT move intensity of BREATHER device prior to performing 10 reps BID x3 days at <70% effort. SLP provided rationale for this.  05/18/23: Needs to be asked about throat tightness next session. Pt communicated within first 5 minutes of session x2 that she was unsure she was performing HEP with BREATHER correctly. SLP observed pt with  inhalation and exhalation and she was performing with excellent procedure.  Long discussion about why not advised to switch settings from set to set or from day to day - need to assess if effort level was under 70% and if so, switch setting to next highest number, and SLP provided rationale for this. Pt thanked SLP.  04/25/23: SLP assessed pt's performance with BREATHER device. SLP had to provide mod cues usually for proper procedure for inhalation and exhalation. Instruction occurred with shoulder position, head position, and when to increase resistance  PATIENT EDUCATION: Education details: see treatment date Person educated: Patient Education method: Explanation, Demonstration, and Verbal cues Education comprehension: verbalized understanding, returned demonstration, verbal cues required, and needs further education  HOME EXERCISE PROGRAM: BREATHER device, voice exercises pt finds helpful  GOALS: Goals reviewed with patient? Yes, in general  SHORT TERM GOALS: Target date: 05/27/23  Pt will use BREATHER device with proper procedure in 3 sessions Baseline: 05/18/23, 06/01/23 Goal status: met  2.  Pt will complete vocal exercises with proper procedure with rare min A in 3 sessions Baseline:  Goal status: addressed 06/06/23, see LTGs  3.  Pt will use trained technique to reduce tension in throat (when tension present to decr vocal quality) by a scale point of 1 in scale 1-10 (10=most tension ever felt) Baseline:  Goal status: not addressed, modified and moved to LTG  4. Pt will use AB in 8 minutes conversation with 80% success in 3 sessions Baseline: 06/01/23, 06/13/23 Goal status: modified based on 06/01/23, partially met   LONG TERM GOALS: Target date: 06/24/23  Pt will improve PROM from initial administration Baseline:  Goal status: not met and continue  2.  Pt will use BREATHER device with proper procedure in 2 sessions after 05/27/23 Baseline: 06/21/23 Goal status: met  3.  Pt  will complete vocal exercises with proper procedure, with mod I, in 2 sessions Baseline:  Goal status: d/c - see note from 06/06/23  4.  In/between two sessions pt will use/report use of trained technique/s to reduce tension in throat, after 05/27/23 Baseline: 06/28/23 Goal status: partially met   5.  Pt will demo knowledge of when to incr resistance of BREATHER, when asked (or observed) in 2 sessions Baseline: 06/21/23 Goal status: partially met and continue  6.  In/between two sessions pt will use/report use of trained technique/s to reduce tension in throat down one point on a pt-based scale of tension Baseline:  Goal status: INITIAL   ASSESSMENT:  CLINICAL IMPRESSION: SLP added LTG #6. Patient is a 33 y.o. F who was seen today for treatment due to periodic tightness in throat when speaking making it difficult for her to talk and sing. Pt states this tightness is dependent upon her joints each day. See treatment date above for today's date for further details on today's session. Pt's voice  quality continues as good to excellent in therapy sessions. At time of eval, pt's voice quality sounded very much WNL, but pt reports that voice quality is intermittent, and decreases with sx of tight feeling in her throat. Given pt's current sx and report, SLP has no reason to believe any laryngeal structural abnormalities exist that would warrant follow up laryngeal imaging at this time. Certainly if this changed during treatment course, SLP will recommend strongly to referring MD to make a referral to ENT.   OBJECTIVE IMPAIRMENTS: include voice disorder. These impairments are limiting patient from household responsibilities, ADLs/IADLs, and effectively communicating at home and in community. Factors affecting potential to achieve goals and functional outcome are co-morbidities.. Patient will benefit from skilled SLP services to address above impairments and improve overall function.  REHAB POTENTIAL:  Good  PLAN:  SLP FREQUENCY: 1x/week  SLP DURATION: 8 weeks (SLP desired x2/week x2 weeks then once/week x6 weeks but pt desired once/week x8 weeks)  PLANNED INTERVENTIONS: Cueing hierachy, Oral motor exercises, SLP instruction and feedback, Compensatory strategies, Patient/family education, and [id number] Treatment of speech (30 or 45 min)     Kadejah Sandiford, CCC-SLP 07/12/2023, 11:35 AM

## 2023-07-19 ENCOUNTER — Ambulatory Visit: Admitting: Family Medicine

## 2023-07-19 VITALS — BP 108/68 | HR 101 | Ht 64.0 in | Wt 113.0 lb

## 2023-07-19 DIAGNOSIS — M357 Hypermobility syndrome: Secondary | ICD-10-CM

## 2023-07-19 DIAGNOSIS — M24472 Recurrent dislocation, left ankle: Secondary | ICD-10-CM | POA: Diagnosis not present

## 2023-07-19 MED ORDER — MONTELUKAST SODIUM 10 MG PO TABS: 10.0000 mg | ORAL_TABLET | Freq: Every day | ORAL | 0 refills | Status: DC

## 2023-07-19 NOTE — Progress Notes (Unsigned)
 [facility] [address] [place] [address] Phone: [phone] Subjective:   Brittany [name], am serving as a scribe for Dr. Arthea Claudene.  I'm seeing this patient by the request  of:  Job Lukes, [place]  CC: Multiple joint complaint follow-up  YEP:Dlagzrupcz  Brittany [name] is a 33 y.o. female coming in with complaint of polyarthralgia. Pain throughout body is still present but not worsening.   Five weeks ago she suddenly developed pain in lateral ankle. Unable to walk on that foot for a while and since then she has worn ASO and used walking stick.        Past Medical History:  Diagnosis Date   Ankle injury    Anxiety    Eating disorder    Frequent headaches    Past Surgical History:  Procedure Laterality Date   ANKLE SURGERY  2018   HERNIA REPAIR     WISDOM TOOTH EXTRACTION     Social History   Socioeconomic History   Marital status: Married    Spouse name: Not on file   Number of children: Not on file   Years of education: Not on file   Highest education level: Bachelor's degree (e.g., BA, AB, BS)  Occupational History   Not on file  Tobacco Use   Smoking status: Never   Smokeless tobacco: Never  Vaping Use   Vaping status: Never Used  Substance and Sexual Activity   Alcohol use: No    Alcohol/week: 0.0 standard drinks of alcohol   Drug use: No   Sexual activity: Yes    Birth control/protection: None  Other Topics Concern   Not on file  Social History Narrative   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[place]   Social Drivers of Health   Financial Resource Strain: Low Risk  (06/12/2023)   Overall Financial Resource Strain (CARDIA)    Difficulty of Paying Living Expenses: Not very hard  Food Insecurity: No Food Insecurity (06/12/2023)   Hunger Vital Sign    Worried About Running Out of Food in the Last Year: Never true    Ran Out of Food in the Last Year: Never true  Transportation Needs: No Transportation  Needs (06/12/2023)   PRAPARE - Administrator, Civil Service (Medical): No    Lack of Transportation (Non-Medical): No  Physical Activity: Sufficiently Active (06/12/2023)   Exercise Vital Sign    Days of Exercise per Week: 5 days    Minutes of Exercise per Session: 40 min  Stress: Stress Concern Present (06/12/2023)   Brittany [name]    Feeling of Stress : To some extent  Social Connections: Socially Integrated (06/12/2023)   Social Connection and Isolation Panel    Frequency of Communication with Friends and Family: Twice a week    Frequency of Social Gatherings with Friends and Family: Once a week    Attends Religious Services: More than 4 times per year    Active Member of [employer] or Organizations: Yes    Attends Engineer, structural: More than 4 times per year    Marital Status: Married   Allergies  Allergen Reactions   Gabapentin Other (See Comments)    Vertigo   Family History  Problem Relation Age of Onset   Depression Mother    Anxiety disorder Mother    Depression Brother    Anxiety disorder Brother    Diabetes Maternal Grandmother  Hearing loss Maternal Grandmother    Alcohol abuse Maternal Grandfather    Depression Maternal Grandfather    Diabetes Maternal Grandfather    Dementia Paternal Grandfather    Thyroid  disease Brother       Current Outpatient Medications (Respiratory):    montelukast  (SINGULAIR ) 10 MG tablet, Take 1 tablet (10 mg total) by mouth at bedtime.  Current Outpatient Medications (Analgesics):    acetaminophen (TYLENOL) 325 MG tablet, Take by mouth.   Current Outpatient Medications (Other):    desipramine (NORPRAMIN) 50 MG tablet, Take 50 mg by mouth daily.   escitalopram  (LEXAPRO ) 20 MG tablet, Take 1 tablet (20 mg total) by mouth daily.   magnesium citrate SOLN, Take by mouth.   Reviewed prior external information including notes and imaging from  primary  care provider As well as notes that were available from care everywhere and other healthcare systems.  Past medical history, social, surgical and family history all reviewed in electronic medical record.  No pertanent information unless stated regarding to the chief complaint.   Review of Systems: Pan system positive. Objective  Blood pressure 108/68, pulse (!) 101, height 5' 4 (1.626 m), weight 113 lb (51.3 kg), SpO2 98%.   General: No apparent distress alert and oriented x3 mood and affect normal, dressed appropriately.  HEENT: Pupils equal, extraocular movements intact  Respiratory: Patient's speak in full sentences and does not appear short of breath  Cardiovascular: No lower extremity edema, non tender, no erythema  Ankle exam does show some peroneal subluxation occurring with range of motion secondary to hypermobility.  No significant swelling.  Does not seem to get stuck in any position.   [id number]; 15 additional minutes spent for Therapeutic exercises as stated in above notes.  This included exercises focusing on stretching, strengthening, with significant focus on eccentric aspects.   Long term goals include an improvement in range of motion, strength, endurance as well as avoiding reinjury. Patient's frequency would include in 1-2 times a day, 3-5 times a week for a duration of 6-12 weeks.  Ankle strengthening that included:  Basic range of motion exercises to allow proper full motion at ankle Stretching of the lower leg and hamstrings  Theraband exercises for the lower leg - inversion, eversion, dorsiflexion and plantarflexion each to be completed with a theraband Balance exercises to increase proprioception Weight bearing exercises to increase strength and balance  Proper technique shown and discussed handout in great detail with ATC.  All questions were discussed and answered.     Impression and Recommendations:    The above documentation has been reviewed and is accurate and  complete Brittany Hagger M Lexus Barletta, DO

## 2023-07-19 NOTE — Patient Instructions (Addendum)
  Body[employer]ix X Fit: [email or web address] Singulair  at night Send message in one to 2 months See other providers See me again in 3 months

## 2023-07-20 ENCOUNTER — Encounter: Payer: Self-pay | Admitting: Family Medicine

## 2023-07-20 DIAGNOSIS — M24472 Recurrent dislocation, left ankle: Secondary | ICD-10-CM | POA: Insufficient documentation

## 2023-07-20 NOTE — Assessment & Plan Note (Signed)
 Hypermobility, discussed strengthening, icing regimen, follow-up with me again after compression and could consider injection but do not think it is necessary.

## 2023-07-20 NOTE — Assessment & Plan Note (Signed)
 Continues to describe the hypermobility.  The patient has multiple different comorbidities that are likely contributing.  Do believe that there is underlying stress that could also be potentially beneficial in giving her trouble.  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  I think that this could be helpful.  Patient started on that.  Did discuss with her about the black box warning.  Follow-up with me again in 2 to 3 months

## 2023-08-02 ENCOUNTER — Ambulatory Visit

## 2023-08-02 DIAGNOSIS — R490 Dysphonia: Secondary | ICD-10-CM

## 2023-08-02 DIAGNOSIS — R498 Other voice and resonance disorders: Secondary | ICD-10-CM

## 2023-08-02 NOTE — Therapy (Signed)
 OUTPATIENT SPEECH LANGUAGE PATHOLOGY VOICE TREATMENT/   Patient Name: Brittany [name] MRN: [id number] DOB:05-11-1990, 33 y.o., female Today's Date: 08/02/2023  PCP: Job Lukes, MD REFERRING PROVIDER: Lorilee Railing, MD  END OF SESSION:  End of Session - 08/02/23 1725     Visit Number 9    Number of Visits 15    Date for SLP Re-Evaluation 09/09/23   due to next ST session 07/12/23   SLP Start Time 1322   arrived late   SLP Stop Time  1400    SLP Time Calculation (min) 38 min    Activity Tolerance Patient tolerated treatment well              Past Medical History:  Diagnosis Date   Ankle injury    Anxiety    Eating disorder    Frequent headaches    Past Surgical History:  Procedure Laterality Date   ANKLE SURGERY  2018   HERNIA REPAIR     WISDOM TOOTH EXTRACTION     Patient Active Problem List   Diagnosis Date Noted   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 07/20/2023   Positive ANA (antinuclear antibody) 10/19/2019   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   Anorexia nervosa, restricting type, in full remission, moderate 02/16/2018   History of depression 02/16/2018   GAD (generalized anxiety disorder) 12/16/2017   Pelvic floor dysfunction 12/16/2017   Right hip pain 11/02/2017   Grade 2 ankle sprain, right, initial encounter 05/16/2016   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   Right ankle pain 03/03/2015   IBS (irritable bowel syndrome) 05/25/2009      Onset date: script dated 03/31/23  REFERRING DIAG:  R09.89 (ICD-10-CM) - Throat tightness    THERAPY DIAG:  Other voice and resonance disorders  Dysphonia  Rationale for Evaluation and Treatment: Rehabilitation  SUBJECTIVE:   SUBJECTIVE STATEMENT: Brittany [name]'s voice sounds WNL today.  Pt accompanied by: self  PERTINENT HISTORY: Pt had course of ST with SLP - [facility] in 2023 and then 4 sessions of ST with ST- [facility] at  [employer] in January-March 2024. PMHx includes anxiety, functional movement disorder. Pt has an integrative medicine therapist who told her she is not breathing well and asked her to obtain a BREATHER device. Pt did so and requires assistance how to use properly. Pt's physiatrist wrote script for this reason.  PAIN:  Are you having pain? Yes: NPRS scale: 310, 2/10 Pain location: abdomen, throat Pain description: heavy and continuous, tense Aggravating factors: exercise or exercise of pelvic floor or eating or constipation or deep breathing, increased tension through use or joints further out of place  Relieving factors: not constipated or gentle deep breathing or light pressure or curling up or distraction,self massage  FALLS: Has patient fallen in last 6 months? No  PATIENT GOALS: Improve talking/singing, improve breath support  OBJECTIVE:  Note: Objective measures were completed at Evaluation unless otherwise noted.    PATIENT REPORTED OUTCOME MEASURES (PROM): Pt-reported that throat tightness can vary from day to day - pt unsure if she can scale tightness.  TREATMENT DATE:  AB = Abdominal breathing  08/02/23: Pt's performance for EMST and IMST was WNL. Pt req'd guidance from SLP to ensure she was not increasing resistance with RMST too quickly. SLP reminded pt that her effort should be >70% for three days prior to moving up the resistance. PT demonstrated understanding. She will come in two weeks and if unchanged, next visit will be in 4 weeks.   07/12/23: Pt's last time where she felt tight enough where she has not wanted to talk was a long time ago. Tension in laryngeal area is such that she feels she cannot project well. Brittany [name] reported one situation she was able to project at work that she felt was successful/baseline since previous session. Today she  completed inhalation and exhalation correctly despite feeling like one side of her diaphragm was not expanding as much as the other side. Visually, pt diaphragm area appeared WNL for inhalation and exhalation. Pt is using techniques to decr tension in throat during the week.    06/28/23: Pt will need to scale throat tension next week. She got work schedule for July yesterday. Pt will make appointments as close to once/week as she can get. SLP encouraged pt to think about this time as good practice for after discharge as she will likely not be able to schedule once/week frequency for about 4 weeks. Today pt used visualization to decr a little bit some tension in her throat and neck. She did this without cues from SLP. Lastly she performed her EMST inhale and exhale with independence. SLP to talk with pt next visit and inquire other than relaxation strategies/techniques for laryngeal area and neck what would be another area SLP could give pt skills to incr her independence.  06/21/23: Pt remarked that she has taken visualization of peace as suggested by SLP prevous session and incorporated these into a framework that works for her, when feeling tightness and tension in her throat. Today, pt wanted to move both inhale and exhale up on her Breather devie and with further min discussion with SLP only moved inhale up. She demonstrated good problem solving strategy today for whether or not to move resistance up or not. Discussion about SLP having almost exhausted skill he has with pt. Brittany [name] stated she did not want to be discharged until she was comfortable with doing things correctly. SLP explained that it would be surprising to SLP if pt were seen <10 visits and explained rationale for this.  06/13/23: SLP educated pt on visualization. Pt preferred the term peace to relax due to negative situations with that word in the past. SLP asked pt what images came to mind with peace and pt thought of 5-6 of them  including a waterfall, laying under a birch tree, and her husband. SLP also re-educated pt about when to move the BREATHER to a more challenging setting. Pt wanted to move to higher setting without achieving 3 days straight of <70% effort. SLP stressed she is building necessary muscle memory by not moving device up too soon.   06/06/23: Pt needs visualization for vocal relaxation next session. Vocal quality was again excellent today. Pt breathing with AB 85% of the time in 15 minutes conversation. SLP and pt discussed today ways to decr vocal tension during the day and pt return demonstrated. SLP used diagram of how to gradually warm up voice and to decr vocal tension. SLP also educated pt on vocal stamina and fatigue in the red yellow green framework and ascertained skillfully  that pt goes from green to red - there is no yellow so SLP suggested pt start to focus on what is her green-yellow feeling and stop there.  Pt has modified vocal exercises to suit her needs so there is not one vocal exercise that remains unmodified from [facility]. So LTG #3 d/c'd.  06/01/23: Excellent (WNL) voice quality today. Mieko reports she is modifying the vocal exercises at [facility] with some techniques of her own. SLP told pt to bring the exercises [facility] gave her and whatever aspects of those exercises SLP can verify their accuracy. Pt used AB in 8 minutes conversation today 90% of the time. SLP again told pt do NOT move intensity of BREATHER device prior to performing 10 reps BID x3 days at <70% effort. SLP provided rationale for this.  05/18/23: Needs to be asked about throat tightness next session. Pt communicated within first 5 minutes of session x2 that she was unsure she was performing HEP with BREATHER correctly. SLP observed pt with inhalation and exhalation and she was performing with excellent procedure.  Long discussion about why not advised to switch settings from set to set or from day to day - need  to assess if effort level was under 70% and if so, switch setting to next highest number, and SLP provided rationale for this. Pt thanked SLP.  04/25/23: SLP assessed pt's performance with BREATHER device. SLP had to provide mod cues usually for proper procedure for inhalation and exhalation. Instruction occurred with shoulder position, head position, and when to increase resistance  PATIENT EDUCATION: Education details: see treatment date Person educated: Patient Education method: Explanation, Demonstration, and Verbal cues Education comprehension: verbalized understanding, returned demonstration, verbal cues required, and needs further education  HOME EXERCISE PROGRAM: BREATHER device, voice exercises pt finds helpful  GOALS: Goals reviewed with patient? Yes, in general  SHORT TERM GOALS: Target date: 05/27/23  Pt will use BREATHER device with proper procedure in 3 sessions Baseline: 05/18/23, 06/01/23 Goal status: met  2.  Pt will complete vocal exercises with proper procedure with rare min A in 3 sessions Baseline:  Goal status: addressed 06/06/23, see LTGs  3.  Pt will use trained technique to reduce tension in throat (when tension present to decr vocal quality) by a scale point of 1 in scale 1-10 (10=most tension ever felt) Baseline:  Goal status: not addressed, modified and moved to LTG  4. Pt will use AB in 8 minutes conversation with 80% success in 3 sessions Baseline: 06/01/23, 06/13/23 Goal status: modified based on 06/01/23, partially met   LONG TERM GOALS: Target date: 06/24/23  Pt will improve PROM from initial administration Baseline:  Goal status: not met and continue  2.  Pt will use BREATHER device with proper procedure in 2 sessions after 05/27/23 Baseline: 06/21/23 Goal status: met  3.  Pt will complete vocal exercises with proper procedure, with mod I, in 2 sessions Baseline:  Goal status: d/c - see note from 06/06/23  4.  In/between two sessions pt will  use/report use of trained technique/s to reduce tension in throat, after 05/27/23 Baseline: 06/28/23 Goal status: partially met   5.  Pt will demo knowledge of when to incr resistance of BREATHER, when asked (or observed) in 2 sessions Baseline: 06/21/23 Goal status: partially met and continue  6.  In/between two sessions pt will use/report use of trained technique/s to reduce tension in throat down one point on a pt-based scale of tension Baseline:  Goal status: INITIAL  ASSESSMENT:  CLINICAL IMPRESSION: Patient is a 33 y.o. F who was seen today for treatment due to periodic tightness in throat when speaking making it difficult for her to talk and sing. Pt states this tightness is dependent upon her joints each day. See treatment date above for today's date for further details on today's session. Pt's voice quality continues as good to excellent in therapy sessions. At time of eval, pt's voice quality sounded very much WNL, but pt reports that voice quality is intermittent, and decreases with sx of tight feeling in her throat. Given pt's current sx and report, SLP has no reason to believe any laryngeal structural abnormalities exist that would warrant follow up laryngeal imaging at this time. Certainly if this changed during treatment course, SLP will recommend strongly to referring MD to make a referral to ENT.   OBJECTIVE IMPAIRMENTS: include voice disorder. These impairments are limiting patient from household responsibilities, ADLs/IADLs, and effectively communicating at home and in community. Factors affecting potential to achieve goals and functional outcome are co-morbidities.. Patient will benefit from skilled SLP services to address above impairments and improve overall function.  REHAB POTENTIAL: Good  PLAN:  SLP FREQUENCY: 1x/week  SLP DURATION: 8 weeks (SLP desired x2/week x2 weeks then once/week x6 weeks but pt desired once/week x8 weeks)  PLANNED INTERVENTIONS: Cueing  hierachy, Oral motor exercises, SLP instruction and feedback, Compensatory strategies, Patient/family education, and [id number] Treatment of speech (30 or 45 min)     Freddie Nghiem, CCC-SLP 08/02/2023, 5:25 PM

## 2023-08-09 ENCOUNTER — Ambulatory Visit

## 2023-08-11 ENCOUNTER — Other Ambulatory Visit: Payer: Self-pay | Admitting: Family Medicine

## 2023-08-11 ENCOUNTER — Encounter: Payer: Self-pay | Admitting: Family Medicine

## 2023-08-11 ENCOUNTER — Other Ambulatory Visit: Payer: Self-pay

## 2023-08-11 DIAGNOSIS — G8929 Other chronic pain: Secondary | ICD-10-CM

## 2023-08-11 DIAGNOSIS — S93332A Other subluxation of left foot, initial encounter: Secondary | ICD-10-CM

## 2023-08-12 ENCOUNTER — Encounter: Payer: Self-pay | Admitting: Family Medicine

## 2023-08-16 ENCOUNTER — Ambulatory Visit: Attending: Physical Medicine and Rehabilitation

## 2023-08-16 DIAGNOSIS — R498 Other voice and resonance disorders: Secondary | ICD-10-CM | POA: Insufficient documentation

## 2023-08-16 NOTE — Therapy (Signed)
 OUTPATIENT SPEECH LANGUAGE PATHOLOGY VOICE TREATMENT/   Patient Name: Brittany [name] MRN: [id number] DOB:1990-11-27, 33 y.o., female Today's Date: 08/16/2023  PCP: Job Lukes, MD REFERRING PROVIDER: Lorilee Railing, MD  END OF SESSION:  End of Session - 08/16/23 1714     Visit Number 10    Number of Visits 15    Date for SLP Re-Evaluation 09/09/23   due to next ST session 07/12/23   SLP Start Time 1618    SLP Stop Time  1700    SLP Time Calculation (min) 42 min    Activity Tolerance Patient tolerated treatment well               Past Medical History:  Diagnosis Date   Ankle injury    Anxiety    Eating disorder    Frequent headaches    Past Surgical History:  Procedure Laterality Date   ANKLE SURGERY  2018   HERNIA REPAIR     WISDOM TOOTH EXTRACTION     Patient Active Problem List   Diagnosis Date Noted   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 07/20/2023   Positive ANA (antinuclear antibody) 10/19/2019   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   Anorexia nervosa, restricting type, in full remission, moderate 02/16/2018   History of depression 02/16/2018   GAD (generalized anxiety disorder) 12/16/2017   Pelvic floor dysfunction 12/16/2017   Right hip pain 11/02/2017   Grade 2 ankle sprain, right, initial encounter 05/16/2016   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   Right ankle pain 03/03/2015   IBS (irritable bowel syndrome) 05/25/2009      Onset date: script dated 03/31/23  REFERRING DIAG:  R09.89 (ICD-10-CM) - Throat tightness    THERAPY DIAG:  Other voice and resonance disorders  Rationale for Evaluation and Treatment: Rehabilitation  SUBJECTIVE:   SUBJECTIVE STATEMENT: [name]'s voice sounds WNL today.  Pt accompanied by: self  PERTINENT HISTORY: Pt had course of ST with SLP - Thomas in 2023 and then 4 sessions of ST with ST- [facility] at [facility] in  January-March 2024. PMHx includes anxiety, functional movement disorder. Pt has an integrative medicine therapist who told her she is not breathing well and asked her to obtain a BREATHER device. Pt did so and requires assistance how to use properly. Pt's physiatrist wrote script for this reason.  PAIN:  Are you having pain? Yes: NPRS scale: 310, 2/10 Pain location: abdomen, throat Pain description: heavy and continuous, tense Aggravating factors: exercise or exercise of pelvic floor or eating or constipation or deep breathing, increased tension through use or joints further out of place  Relieving factors: not constipated or gentle deep breathing or light pressure or curling up or distraction,self massage  FALLS: Has patient fallen in last 6 months? No  PATIENT GOALS: Improve talking/singing, improve breath support  OBJECTIVE:  Note: Objective measures were completed at Evaluation unless otherwise noted.    PATIENT REPORTED OUTCOME MEASURES (PROM): Pt-reported that throat tightness can vary from day to day - pt unsure if she can scale tightness.  TREATMENT DATE:  AB = Abdominal breathing  08/16/23: SLP to ask pt about LTGs next session. SLP and pt agreed pt would be benefited by keeping 08/30/23 appointment to get insight on what she and SLP discussed today; If all else remains the same with HEP for EMST/IMST procedure and her body (connective tissue disease) is the reason for feeling off when she performs HEP she is to count that as a set to count towards moving to a more difficult setting on BREATHER device. Pt stated today she feels very tight in para-thyroid  musculature but pt's voice quality remained WNL throughout the entire session. Pt also stated she feels like her voice is weakest in the vocal range of her voice, which SLP told her seemed strange because of her  WNL vocal quality. SLP suggested she think about voice lessons from university professor.   08/02/23: Pt's performance for EMST and IMST was WNL. Pt req'd guidance from SLP to ensure she was not increasing resistance with RMST too quickly. SLP reminded pt that her effort should be >70% for three days prior to moving up the resistance. PT demonstrated understanding. She will come in two weeks and if unchanged, next visit will be in 4 weeks.   07/12/23: Pt's last time where she felt tight enough where she has not wanted to talk was a long time ago. Tension in laryngeal area is such that she feels she cannot project well. Mailey reported one situation she was able to project at work that she felt was successful/baseline since previous session. Today she completed inhalation and exhalation correctly despite feeling like one side of her diaphragm was not expanding as much as the other side. Visually, pt diaphragm area appeared WNL for inhalation and exhalation. Pt is using techniques to decr tension in throat during the week.    06/28/23: Pt will need to scale throat tension next week. She got work schedule for July yesterday. Pt will make appointments as close to once/week as she can get. SLP encouraged pt to think about this time as good practice for after discharge as she will likely not be able to schedule once/week frequency for about 4 weeks. Today pt used visualization to decr a little bit some tension in her throat and neck. She did this without cues from SLP. Lastly she performed her EMST inhale and exhale with independence. SLP to talk with pt next visit and inquire other than relaxation strategies/techniques for laryngeal area and neck what would be another area SLP could give pt skills to incr her independence.  06/21/23: Pt remarked that she has taken visualization of peace as suggested by SLP prevous session and incorporated these into a framework that works for her, when feeling tightness and  tension in her throat. Today, pt wanted to move both inhale and exhale up on her Breather devie and with further min discussion with SLP only moved inhale up. She demonstrated good problem solving strategy today for whether or not to move resistance up or not. Discussion about SLP having almost exhausted skill he has with pt. Embrie stated she did not want to be discharged until she was comfortable with doing things correctly. SLP explained that it would be surprising to SLP if pt were seen <10 visits and explained rationale for this.  06/13/23: SLP educated pt on visualization. Pt preferred the term peace to relax due to negative situations with that word in the past. SLP asked pt what images came to mind with peace and pt thought  of 5-6 of them including a waterfall, laying under a birch tree, and her husband. SLP also re-educated pt about when to move the BREATHER to a more challenging setting. Pt wanted to move to higher setting without achieving 3 days straight of <70% effort. SLP stressed she is building necessary muscle memory by not moving device up too soon.   06/06/23: Pt needs visualization for vocal relaxation next session. Vocal quality was again excellent today. Pt breathing with AB 85% of the time in 15 minutes conversation. SLP and pt discussed today ways to decr vocal tension during the day and pt return demonstrated. SLP used diagram of how to gradually warm up voice and to decr vocal tension. SLP also educated pt on vocal stamina and fatigue in the red yellow green framework and ascertained skillfully that pt goes from green to red - there is no yellow so SLP suggested pt start to focus on what is her green-yellow feeling and stop there.  Pt has modified vocal exercises to suit her needs so there is not one vocal exercise that remains unmodified from [facility]. So LTG #3 d/c'd.  06/01/23: Excellent (WNL) voice quality today. Chiyo reports she is modifying the vocal  exercises at [facility] with some techniques of her own. SLP told pt to bring the exercises [facility] gave her and whatever aspects of those exercises SLP can verify their accuracy. Pt used AB in 8 minutes conversation today 90% of the time. SLP again told pt do NOT move intensity of BREATHER device prior to performing 10 reps BID x3 days at <70% effort. SLP provided rationale for this.  05/18/23: Needs to be asked about throat tightness next session. Pt communicated within first 5 minutes of session x2 that she was unsure she was performing HEP with BREATHER correctly. SLP observed pt with inhalation and exhalation and she was performing with excellent procedure.  Long discussion about why not advised to switch settings from set to set or from day to day - need to assess if effort level was under 70% and if so, switch setting to next highest number, and SLP provided rationale for this. Pt thanked SLP.  04/25/23: SLP assessed pt's performance with BREATHER device. SLP had to provide mod cues usually for proper procedure for inhalation and exhalation. Instruction occurred with shoulder position, head position, and when to increase resistance  PATIENT EDUCATION: Education details: see treatment date Person educated: Patient Education method: Explanation, Demonstration, and Verbal cues Education comprehension: verbalized understanding, returned demonstration, verbal cues required, and needs further education  HOME EXERCISE PROGRAM: BREATHER device, voice exercises pt finds helpful  GOALS: Goals reviewed with patient? Yes, in general  SHORT TERM GOALS: Target date: 05/27/23  Pt will use BREATHER device with proper procedure in 3 sessions Baseline: 05/18/23, 06/01/23 Goal status: met  2.  Pt will complete vocal exercises with proper procedure with rare min A in 3 sessions Baseline:  Goal status: addressed 06/06/23, see LTGs  3.  Pt will use trained technique to reduce tension in throat (when tension  present to decr vocal quality) by a scale point of 1 in scale 1-10 (10=most tension ever felt) Baseline:  Goal status: not addressed, modified and moved to LTG  4. Pt will use AB in 8 minutes conversation with 80% success in 3 sessions Baseline: 06/01/23, 06/13/23 Goal status: modified based on 06/01/23, partially met   LONG TERM GOALS: Target date: 06/24/23  Pt will improve PROM from initial administration Baseline:  Goal status: not met  and continue  2.  Pt will use BREATHER device with proper procedure in 2 sessions after 05/27/23 Baseline: 06/21/23 Goal status: met  3.  Pt will complete vocal exercises with proper procedure, with mod I, in 2 sessions Baseline:  Goal status: d/c - see note from 06/06/23  4.  In/between two sessions pt will use/report use of trained technique/s to reduce tension in throat, after 05/27/23 Baseline: 06/28/23 Goal status: partially met   5.  Pt will demo knowledge of when to incr resistance of BREATHER, when asked (or observed) in 2 sessions Baseline: 06/21/23 Goal status: partially met and continue  6.  In/between two sessions pt will use/report use of trained technique/s to reduce tension in throat down one point on a pt-based scale of tension Baseline:  Goal status: INITIAL   ASSESSMENT:  CLINICAL IMPRESSION: Patient is a 33 y.o. F who was seen today for treatment due to periodic tightness in throat when speaking making it difficult for her to talk and sing. Pt states this tightness is dependent upon her joints each day. See treatment date above for today's date for further details on today's session. Pt's voice quality continues as good to excellent in therapy sessions. At time of eval, pt's voice quality sounded very much WNL, but pt reports that voice quality is intermittent, and decreases with sx of tight feeling in her throat. To date, SLP has not heard variability in pt's voice quality in session. Given pt's current sx and report, SLP has no  reason to believe any laryngeal structural abnormalities exist that would warrant follow up laryngeal imaging at this time. Certainly if this changed during treatment course, SLP will recommend strongly to referring MD to make a referral to ENT.   OBJECTIVE IMPAIRMENTS: include voice disorder. These impairments are limiting patient from household responsibilities, ADLs/IADLs, and effectively communicating at home and in community. Factors affecting potential to achieve goals and functional outcome are co-morbidities.. Patient will benefit from skilled SLP services to address above impairments and improve overall function.  REHAB POTENTIAL: Good  PLAN:  SLP FREQUENCY: 1x/week  SLP DURATION: 8 weeks (SLP desired x2/week x2 weeks then once/week x6 weeks but pt desired once/week x8 weeks)  PLANNED INTERVENTIONS: Cueing hierachy, Oral motor exercises, SLP instruction and feedback, Compensatory strategies, Patient/family education, and [id number] Treatment of speech (30 or 45 min)     Nicodemus Denk, CCC-SLP 08/16/2023, 5:14 PM

## 2023-08-23 ENCOUNTER — Encounter

## 2023-08-29 ENCOUNTER — Encounter: Payer: Self-pay | Admitting: Podiatry

## 2023-08-29 ENCOUNTER — Ambulatory Visit (INDEPENDENT_AMBULATORY_CARE_PROVIDER_SITE_OTHER)

## 2023-08-29 ENCOUNTER — Ambulatory Visit (INDEPENDENT_AMBULATORY_CARE_PROVIDER_SITE_OTHER): Admitting: Podiatry

## 2023-08-29 DIAGNOSIS — Q6672 Congenital pes cavus, left foot: Secondary | ICD-10-CM | POA: Diagnosis not present

## 2023-08-29 DIAGNOSIS — M7672 Peroneal tendinitis, left leg: Secondary | ICD-10-CM | POA: Diagnosis not present

## 2023-08-29 DIAGNOSIS — M7752 Other enthesopathy of left foot: Secondary | ICD-10-CM

## 2023-08-29 DIAGNOSIS — Q6671 Congenital pes cavus, right foot: Secondary | ICD-10-CM | POA: Diagnosis not present

## 2023-08-29 DIAGNOSIS — M25373 Other instability, unspecified ankle: Secondary | ICD-10-CM

## 2023-08-29 DIAGNOSIS — M7750 Other enthesopathy of unspecified foot: Secondary | ICD-10-CM

## 2023-08-29 NOTE — Progress Notes (Signed)
  Subjective:  Patient ID: Brittany [name], female    DOB: 1990/01/18,   MRN: [id number]  Chief Complaint  Patient presents with   Foot Pain    About 10 weeks ago, I couldn't walk.  My ankle is extremely wobbly.  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  I have been wearing this brace to keep the tendon in place.  I want to see if I can get custom insoles.    33 y.o. female presents for concern of left ankle pain. Relates 10 weeks ago she suddenly had intense pain in her ankle and unable ot walk. She relates her ankles have always been prone to spraining and has a history of this. She has had surgery on her right ankle for instability in the past. She has been wearing an ankle brace for two weeks and this has been helping.  . Denies any other pedal complaints. Denies n/v/f/c.   Past Medical History:  Diagnosis Date   Ankle injury    Anxiety    Eating disorder    Frequent headaches     Objective:  Physical Exam: Vascular: DP/PT pulses 2/4 bilateral. CFT <3 seconds. Normal hair growth on digits. No edema.  Skin. No lacerations or abrasions bilateral feet.  Musculoskeletal: MMT 5/5 bilateral lower extremities in DF, PF, Inversion and Eversion. Deceased ROM in DF of ankle joint. Tender to peroneal tendon posterior to lateral malleolus. No pain over ATFL or CFL currently . No pain to peroneal insertions. Pes cavus noted. Tender with eversion and plantar flexion of the ankle.  Neurological: Sensation intact to light touch.   Assessment:   1. Peroneal tendonitis, left   2. Chronic instability of ankle   3. Pes cavus of both feet      Plan:  Patient was evaluated and treated and all questions answered. X-rays reviewed and discussed with patient. Discussed peroneal tendinitis and treatment options at length with patient Discussed stretching exercises and provided handout. Continue ankle braces  Discussed [place] vs [place]  brace with patient for additional stability. She will  check with insurance.  Discussed that if the symptoms do not improve can consider PT/MRI. Patient to return as needed  Asberry Failing, DPM

## 2023-08-29 NOTE — Patient Instructions (Signed)
 Peroneal Tendinopathy Rehab Ask your health care provider which exercises are safe for you. Do exercises exactly as told by your health care provider and adjust them as directed. It is normal to feel mild stretching, pulling, tightness, or discomfort as you do these exercises. Stop right away if you feel sudden pain or your pain gets worse. Do not begin these exercises until told by your health care provider. Stretching and range-of-motion exercises These exercises warm up your muscles and joints. They can help improve the movement and flexibility of your ankle. They may also help to relieve pain and stiffness. Gastrocnemius and soleus stretch, standing This is an exercise in which you stand on a step and use your body weight to stretch your calf muscles. To do this exercise: Stand on the edge of a step on the ball of your left / right foot. The ball of your foot is on the walking surface, right under your toes. Keep your other foot firmly on the same step. Hold on to the wall, a railing, or a chair for balance. Slowly lift your other foot, allowing your body weight to press your left / right heel down over the edge of the step. You should feel a stretch in your left / right calf (gastrocnemius and soleus). Hold this position for __________ seconds. Return both feet to the step. Repeat this exercise with a slight bend in your left / right knee. Repeat __________ times with your left / right knee straight and __________ times with your left / right knee bent. Complete this exercise __________ times a day. Strengthening exercises These exercises build strength and endurance in your foot and ankle. Endurance is the ability to use your muscles for a long time, even after they get tired. Ankle dorsiflexion with band  Secure a rubber exercise band or tube to an object, such as a table leg, that will not move when the band is pulled. Secure the other end of the band around your left / right foot. Sit on  the floor. Face the object with your left / right leg extended. The band or tube should be slightly tense when your foot is relaxed. Slowly flex your left / right ankle and toes to bring your foot toward you (dorsiflexion). Hold this position for __________ seconds. Let the band or tube slowly pull your foot back to the starting position. Repeat __________ times. Complete this exercise __________ times a day. Ankle eversion  Sit on the floor with your legs straight out in front of you. Loop a rubber exercise band or tube around the ball of your left / right foot. The ball of your foot is on the walking surface, right under your toes. Hold the ends of the band in your hands. You can also secure the band to a stable object. The band or tube should be slightly tense when your foot is relaxed. Slowly push your foot outward, away from your other leg (eversion). Hold this position for __________ seconds. Slowly return your foot to the starting position. Repeat __________ times. Complete this exercise __________ times a day. Plantar flexion, standing This exercise is sometimes called a standing heel raise. Stand with your feet shoulder-width apart. Place your hands on a wall or table to steady yourself as needed. Try not to use it for support. Keep your weight spread evenly over the width of your feet while you slowly rise up on your toes (plantar flexion). If told by your health care provider: Shift your weight  toward your left / right leg until you feel challenged. Stand on your left / right leg only. Hold this position for __________ seconds. Repeat __________ times. Complete this exercise __________ times a day. Single leg stand  Without shoes, stand near a railing or in a doorway. You may hold on to the railing or doorframe as needed. Stand on your left / right foot. Keep your big toe down on the floor and try to keep your arch lifted. Do not roll to the outside of your foot. If this  exercise is too easy, you can try it with your eyes closed or while standing on a pillow. Hold this position for __________ seconds. Repeat __________ times. Complete this exercise __________ times a day. This information is not intended to replace advice given to you by your health care provider. Make sure you discuss any questions you have with your health care provider. Document Revised: 04/16/2021 Document Reviewed: 04/16/2021 Elsevier Patient Education  2024 [employer].

## 2023-08-30 ENCOUNTER — Encounter: Payer: Self-pay | Admitting: Podiatry

## 2023-08-30 ENCOUNTER — Ambulatory Visit

## 2023-09-01 ENCOUNTER — Other Ambulatory Visit

## 2023-09-02 ENCOUNTER — Encounter: Payer: Self-pay | Admitting: Family Medicine

## 2023-09-14 ENCOUNTER — Ambulatory Visit: Attending: Physical Medicine and Rehabilitation

## 2023-09-14 DIAGNOSIS — R490 Dysphonia: Secondary | ICD-10-CM | POA: Diagnosis present

## 2023-09-14 DIAGNOSIS — R498 Other voice and resonance disorders: Secondary | ICD-10-CM | POA: Insufficient documentation

## 2023-09-14 NOTE — Therapy (Signed)
 OUTPATIENT SPEECH LANGUAGE PATHOLOGY VOICE TREATMENT/RECERTIFICATION   Patient Name: Brittany [name] MRN: [id number] DOB:1990/11/15, 33 y.o., female Today's Date: 09/14/2023  PCP: Job Lukes, MD REFERRING PROVIDER: Lorilee Railing, MD  END OF SESSION:  End of Session - 09/14/23 1324     Visit Number 11    Number of Visits 15    Date for SLP Re-Evaluation 10/21/23   due to next ST session 07/12/23   SLP Start Time 1321    SLP Stop Time  1400    SLP Time Calculation (min) 39 min    Activity Tolerance Patient tolerated treatment well               Past Medical History:  Diagnosis Date   Ankle injury    Anxiety    Eating disorder    Frequent headaches    Past Surgical History:  Procedure Laterality Date   ANKLE SURGERY  2018   HERNIA REPAIR     WISDOM TOOTH EXTRACTION     Patient Active Problem List   Diagnosis Date Noted   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 07/20/2023   Positive ANA (antinuclear antibody) 10/19/2019   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  Objective: See below.  Goal Update: See below.  Plan: See pt for one more session to ensure progress continues.    Onset date: script dated 03/31/23  REFERRING DIAG:  R09.89 (ICD-10-CM) - Throat tightness    THERAPY DIAG:  Dysphonia  Other voice and resonance  disorders  Rationale for Evaluation and Treatment: Rehabilitation  SUBJECTIVE:   SUBJECTIVE STATEMENT: I progressed with the inhalation and the exhalation.  Pt accompanied by: self  PERTINENT HISTORY: Pt had course of ST with SLP - Thomas in 2023 and then 4 sessions of ST with ST- [facility] at [facility] in January-March 2024. PMHx includes anxiety, functional movement disorder. Pt has an integrative medicine therapist who told her she is not breathing well and asked her to obtain a BREATHER device. Pt did so and requires assistance how to use properly. Pt's physiatrist wrote script for this reason.  PAIN:  Are you having pain? Yes: NPRS scale: 5/10 Pain location: abdomen Pain description: pressure, constant Aggravating factors: doing something to make it tense up Relieving factors: not constipated, or gentle deep breathing or light pressure or curling up or distraction,self massage  FALLS: Has patient fallen in last 6 months? No  PATIENT GOALS: Improve talking/singing, improve breath support  OBJECTIVE:  Note: Objective measures were completed at Evaluation unless otherwise noted.    PATIENT REPORTED OUTCOME MEASURES (PROM): Pt-reported that throat tightness can vary from day to day - pt unsure if she can scale tightness.  TREATMENT DATE:  AB = Abdominal breathing  09/14/23: Again, difficult for pt to grade relaxation with hypermobility disorder but pt reported she is doing things to successfully decr tension with voice mostly including manual massage, and vocal relaxation when she is able. Pt completed EMST and IMST with proper procedure 20 adequate sounding blows each. Pt inquired about if SLP is needed prior to next appointment and SLP offered email/phone call for quick questions. SLP suspects next session will be pt's last session due to success thus far  in therapy course.   08/16/23: SLP to ask pt about LTGs next session. SLP and pt agreed pt would be benefited by keeping 08/30/23 appointment to get insight on what she and SLP discussed today; If all else remains the same with HEP for EMST/IMST procedure and her body (connective tissue disease) is the reason for feeling off when she performs HEP she is to count that as a set to count towards moving to a more difficult setting on BREATHER device. Pt stated today she feels very tight in para-thyroid  musculature but pt's voice quality remained WNL throughout the entire session. Pt also stated she feels like her voice is weakest in the vocal range of her voice, which SLP told her seemed strange because of her WNL vocal quality. SLP suggested she think about voice lessons from university professor.   08/02/23: Pt's performance for EMST and IMST was WNL. Pt req'd guidance from SLP to ensure she was not increasing resistance with RMST too quickly. SLP reminded pt that her effort should be >70% for three days prior to moving up the resistance. PT demonstrated understanding. She will come in two weeks and if unchanged, next visit will be in 4 weeks.   07/12/23: Pt's last time where she felt tight enough where she has not wanted to talk was a long time ago. Tension in laryngeal area is such that she feels she cannot project well. Noami reported one situation she was able to project at work that she felt was successful/baseline since previous session. Today she completed inhalation and exhalation correctly despite feeling like one side of her diaphragm was not expanding as much as the other side. Visually, pt diaphragm area appeared WNL for inhalation and exhalation. Pt is using techniques to decr tension in throat during the week.    06/28/23: Pt will need to scale throat tension next week. She got work schedule for July yesterday. Pt will make appointments as close to once/week as she can get. SLP encouraged pt to  think about this time as good practice for after discharge as she will likely not be able to schedule once/week frequency for about 4 weeks. Today pt used visualization to decr a little bit some tension in her throat and neck. She did this without cues from SLP. Lastly she performed her EMST inhale and exhale with independence. SLP to talk with pt next visit and inquire other than relaxation strategies/techniques for laryngeal area and neck what would be another area SLP could give pt skills to incr her independence.  06/21/23: Pt remarked that she has taken visualization of peace as suggested by SLP prevous session and incorporated these into a framework that works for her, when feeling tightness and tension in her throat. Today, pt wanted to move both inhale and exhale up on her Breather devie and with further min discussion with SLP only moved inhale up. She demonstrated good problem solving strategy today for whether or not to move resistance up or  not. Discussion about SLP having almost exhausted skill he has with pt. Jaila stated she did not want to be discharged until she was comfortable with doing things correctly. SLP explained that it would be surprising to SLP if pt were seen <10 visits and explained rationale for this.  06/13/23: SLP educated pt on visualization. Pt preferred the term peace to relax due to negative situations with that word in the past. SLP asked pt what images came to mind with peace and pt thought of 5-6 of them including a waterfall, laying under a birch tree, and her husband. SLP also re-educated pt about when to move the BREATHER to a more challenging setting. Pt wanted to move to higher setting without achieving 3 days straight of <70% effort. SLP stressed she is building necessary muscle memory by not moving device up too soon.   06/06/23: Pt needs visualization for vocal relaxation next session. Vocal quality was again excellent today. Pt breathing with AB 85% of the  time in 15 minutes conversation. SLP and pt discussed today ways to decr vocal tension during the day and pt return demonstrated. SLP used diagram of how to gradually warm up voice and to decr vocal tension. SLP also educated pt on vocal stamina and fatigue in the red yellow green framework and ascertained skillfully that pt goes from green to red - there is no yellow so SLP suggested pt start to focus on what is her green-yellow feeling and stop there.  Pt has modified vocal exercises to suit her needs so there is not one vocal exercise that remains unmodified from [facility]. So LTG #3 d/c'd.  06/01/23: Excellent (WNL) voice quality today. Viha reports she is modifying the vocal exercises at [facility] with some techniques of her own. SLP told pt to bring the exercises [facility] gave her and whatever aspects of those exercises SLP can verify their accuracy. Pt used AB in 8 minutes conversation today 90% of the time. SLP again told pt do NOT move intensity of BREATHER device prior to performing 10 reps BID x3 days at <70% effort. SLP provided rationale for this.  05/18/23: Needs to be asked about throat tightness next session. Pt communicated within first 5 minutes of session x2 that she was unsure she was performing HEP with BREATHER correctly. SLP observed pt with inhalation and exhalation and she was performing with excellent procedure.  Long discussion about why not advised to switch settings from set to set or from day to day - need to assess if effort level was under 70% and if so, switch setting to next highest number, and SLP provided rationale for this. Pt thanked SLP.  04/25/23: SLP assessed pt's performance with BREATHER device. SLP had to provide mod cues usually for proper procedure for inhalation and exhalation. Instruction occurred with shoulder position, head position, and when to increase resistance  PATIENT EDUCATION: Education details: see treatment date Person educated:  Patient Education method: Explanation, Demonstration, and Verbal cues Education comprehension: verbalized understanding, returned demonstration, verbal cues required, and needs further education  HOME EXERCISE PROGRAM: BREATHER device, voice exercises pt finds helpful  GOALS: Goals reviewed with patient? Yes, in general  SHORT TERM GOALS: Target date: 05/27/23  Pt will use BREATHER device with proper procedure in 3 sessions Baseline: 05/18/23, 06/01/23 Goal status: met  2.  Pt will complete vocal exercises with proper procedure with rare min A in 3 sessions Baseline:  Goal status: addressed 06/06/23, see LTGs  3.  Pt will use trained  technique to reduce tension in throat (when tension present to decr vocal quality) by a scale point of 1 in scale 1-10 (10=most tension ever felt) Baseline:  Goal status: not addressed, modified and moved to LTG  4. Pt will use AB in 8 minutes conversation with 80% success in 3 sessions Baseline: 06/01/23, 06/13/23 Goal status: modified based on 06/01/23, partially met   LONG TERM GOALS: Target date:  10/22/23  Pt will improve PROM from initial administration Baseline:  Goal status: continue - will take measure on last ST visit  2.  Pt will use BREATHER device with proper procedure in 2 sessions after 05/27/23 Baseline: 06/21/23 Goal status: met  3.  Pt will complete vocal exercises with proper procedure, with mod I, in 2 sessions Baseline:  Goal status: d/c - see note from 06/06/23  4.  In/between two sessions pt will use/report use of trained technique/s to reduce tension in throat, after 05/27/23 Baseline: 06/28/23 Goal status: partially met and continue  5.  Pt will demo knowledge of when to incr resistance of BREATHER, when asked (or observed) in 2 sessions Baseline: 06/21/23 Goal status: met   6.  In/between two sessions pt will use/report use of trained technique/s to reduce tension in throat down one point on a pt-based scale of  tension Baseline: 09/14/23 Goal status: partially met and continue   ASSESSMENT:  CLINICAL IMPRESSION: RECERT TODAY. Patient is a 33 y.o. F who was seen today for treatment due to periodic tightness in throat when speaking making it difficult for her to talk and sing. Pt states this tightness is dependent upon her joints each day. See treatment date above for today's date for further details on today's session. Pt's voice quality remains good to excellent/WNL in therapy sessions. At time of eval, pt's voice quality sounded very much WNL, but pt reports that voice quality is intermittent, and decreases with sx of tight feeling in her throat. To date, SLP has not heard variability in pt's voice quality in session. Given pt's current sx and report, SLP cont to have no reason to believe any laryngeal structural abnormalities exist that would warrant follow up laryngeal imaging at this time. Certainly if this changed during treatment course, SLP will recommend strongly to referring MD to make a referral to ENT.  Pt will be seen for one more session to ensure success over time.   OBJECTIVE IMPAIRMENTS: include voice disorder. These impairments are limiting patient from household responsibilities, ADLs/IADLs, and effectively communicating at home and in community. Factors affecting potential to achieve goals and functional outcome are co-morbidities.. Patient will benefit from skilled SLP services to address above impairments and improve overall function.  REHAB POTENTIAL: Good  PLAN:  SLP FREQUENCY: 1x/week  SLP DURATION: one more  sessions   PLANNED INTERVENTIONS: Cueing hierachy, Oral motor exercises, SLP instruction and feedback, Compensatory strategies, Patient/family education, and [id number] Treatment of speech (30 or 45 min)     Hilman Kissling, CCC-SLP 09/14/2023, 11:37 PM

## 2023-10-17 NOTE — Progress Notes (Unsigned)
 [name] [address] [place] [address] Phone: [phone] Subjective:   ISusannah [name], am serving as a scribe for Dr. Arthea Claudene.  I'm seeing this patient by the request  of:  Job Lukes, [place]  CC: Multiple joint pain  YEP:Dlagzrupcz  07/19/2023 Hypermobility, discussed strengthening, icing regimen, follow-up with me again after compression and could consider injection but do not think it is necessary.     Continues to describe the hypermobility.  The patient has multiple different comorbidities that are likely contributing.  Do believe that there is underlying stress that could also be potentially beneficial in giving her trouble.  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  I think that this could be helpful.  Patient started on that.  Did discuss with her about the black box warning.  Follow-up with me again in 2 to 3 months     Updated 10/19/2023 Brittany [name] Brittany [name] is a 33 y.o. female coming in with complaint of hypermobility. Still dealing with all the same issues. Just wants to follow up.  Still having difficulties overall.  Seen allergist but did not have any other answers.       Past Medical History:  Diagnosis Date   Ankle injury    Anxiety    Eating disorder    Frequent headaches    Past Surgical History:  Procedure Laterality Date   ANKLE SURGERY  2018   HERNIA REPAIR     WISDOM TOOTH EXTRACTION     Social History   Socioeconomic History   Marital status: Married    Spouse name: Not on file   Number of children: Not on file   Years of education: Not on file   Highest education level: Bachelor's degree (e.g., BA, AB, BS)  Occupational History   Not on file  Tobacco Use   Smoking status: Never   Smokeless tobacco: Never  Vaping Use   Vaping status: Never Used  Substance and Sexual Activity   Alcohol use: No    Alcohol/week: 0.0 standard drinks of alcohol   Drug use: No   Sexual  activity: Yes    Birth control/protection: None  Other Topics Concern   Not on file  Social History Narrative   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[place]   Social Drivers of Health   Financial Resource Strain: Low Risk  (06/12/2023)   Overall Financial Resource Strain (CARDIA)    Difficulty of Paying Living Expenses: Not very hard  Food Insecurity: No Food Insecurity (06/12/2023)   Hunger Vital Sign    Worried About Running Out of Food in the Last Year: Never true    Ran Out of Food in the Last Year: Never true  Transportation Needs: No Transportation Needs (06/12/2023)   PRAPARE - Administrator, Civil Service (Medical): No    Lack of Transportation (Non-Medical): No  Physical Activity: Sufficiently Active (06/12/2023)   Exercise Vital Sign    Days of Exercise per Week: 5 days    Minutes of Exercise per Session: 40 min  Stress: Stress Concern Present (06/12/2023)   [employer] of Occupational Health - Occupational Stress Questionnaire    Feeling of Stress : To some extent  Social Connections: Socially Integrated (06/12/2023)   Social Connection and Isolation Panel    Frequency of Communication with Friends and Family: Twice a week    Frequency of Social Gatherings with Friends and Family: Once a week  Attends Religious Services: More than 4 times per year    Active Member of Clubs or Organizations: Yes    Attends Engineer, structural: More than 4 times per year    Marital Status: Married   Allergies  Allergen Reactions   Gabapentin Other (See Comments)    Vertigo   Family History  Problem Relation Age of Onset   Depression Mother    Anxiety disorder Mother    Depression Brother    Anxiety disorder Brother    Diabetes Maternal Grandmother    Hearing loss Maternal Grandmother    Alcohol abuse Maternal Grandfather    Depression Maternal Grandfather    Diabetes Maternal Grandfather    Dementia Paternal Grandfather    Thyroid  disease Brother        Current Outpatient Medications (Respiratory):    cromolyn (NASALCROM) 5.2 MG/ACT nasal spray, Place 1 spray into both nostrils 4 (four) times daily.   montelukast  (SINGULAIR ) 10 MG tablet, TAKE 1 TABLET BY MOUTH EVERYDAY AT BEDTIME  Current Outpatient Medications (Analgesics):    acetaminophen (TYLENOL) 325 MG tablet, Take by mouth.   Current Outpatient Medications (Other):    desipramine (NORPRAMIN) 50 MG tablet, Take 50 mg by mouth daily.   escitalopram  (LEXAPRO ) 20 MG tablet, Take 1 tablet (20 mg total) by mouth daily.   magnesium citrate SOLN, Take by mouth.   Reviewed prior external information including notes and imaging from  primary care provider As well as notes that were available from care everywhere and other healthcare systems.  Past medical history, social, surgical and family history all reviewed in electronic medical record.  No pertanent information unless stated regarding to the chief complaint.   Review of Systems:  No headache, visual changes, nausea, vomiting, diarrhea, constipation, dizziness, abdominal pain, skin rash, fevers, chills, night sweats, weight loss, swollen lymph nodes,  joint swelling, chest pain, shortness of breath, mood changes. POSITIVE muscle aches, body aches  Objective  Blood pressure 106/68, pulse 99, height 5' 4 (1.626 m), weight 114 lb (51.7 kg), SpO2 100%.   General: No apparent distress alert and oriented x3 mood and affect normal, dressed appropriately.  HEENT: Pupils equal, extraocular movements intact  Respiratory: Patient's speak in full sentences and does not appear short of breath  Cardiovascular: No lower extremity edema, non tender, no erythema  Deferred any other type of examination    Impression and Recommendations:

## 2023-10-19 ENCOUNTER — Ambulatory Visit: Admitting: Family Medicine

## 2023-10-19 VITALS — BP 106/68 | HR 99 | Ht 64.0 in | Wt 114.0 lb

## 2023-10-19 DIAGNOSIS — R7689 Other specified abnormal immunological findings in serum: Secondary | ICD-10-CM

## 2023-10-19 MED ORDER — CROMOLYN SODIUM 5.2 MG/ACT NA AERS: 1.0000 | INHALATION_SPRAY | Freq: Four times a day (QID) | NASAL | 0 refills | Status: DC

## 2023-10-19 NOTE — Assessment & Plan Note (Signed)
 Patient is mostly concerned with possible mast cell pathology.  We discussed with patient about different medications.  Cromolyn could be beneficial.  Started as a nose spray.  Discussed the potential role of oral but it appeared that it was maybe not on the formulary for her.  May need to switch to 100 mg per 2 mL up to 4 times daily is the recommended dose for any of the GI cromolyn dosing.  Follow-up again with me in 3 months.

## 2023-10-19 NOTE — Patient Instructions (Addendum)
 Good to see you. Stop singular. Start cromolyn nasal spray. See me again in 3 months.

## 2023-10-20 ENCOUNTER — Ambulatory Visit: Attending: Physical Medicine and Rehabilitation

## 2023-10-20 DIAGNOSIS — R498 Other voice and resonance disorders: Secondary | ICD-10-CM | POA: Diagnosis present

## 2023-10-20 DIAGNOSIS — R490 Dysphonia: Secondary | ICD-10-CM | POA: Diagnosis present

## 2023-10-20 NOTE — Therapy (Signed)
 OUTPATIENT SPEECH LANGUAGE PATHOLOGY VOICE TREATMENT/RECERTIFICATION   Patient Name: Brittany [name] MRN: [id number] DOB:04-Feb-1990, 33 y.o., female Today's Date: 10/20/2023  PCP: Job Lukes, MD REFERRING PROVIDER: Lorilee Railing, MD  END OF SESSION:  End of Session - 10/20/23 1632     Visit Number 12    Number of Visits 15    Date for Recertification  10/21/23    SLP Start Time 1630    SLP Stop Time  1700    SLP Time Calculation (min) 30 min    Activity Tolerance Patient tolerated treatment well               Past Medical History:  Diagnosis Date   Ankle injury    Anxiety    Eating disorder    Frequent headaches    Past Surgical History:  Procedure Laterality Date   ANKLE SURGERY  2018   HERNIA REPAIR     WISDOM TOOTH EXTRACTION     Patient Active Problem List   Diagnosis Date Noted   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 07/20/2023   Positive ANA (antinuclear antibody) 10/19/2019   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 Pt 's voice was WNL at least in last 4-5 sessions. 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    Remaining deficits: None notable by SLP.   Education / Equipment: See individual therapy session notes    Patient agrees to discharge. Patient goals were partially met. Patient is being discharged due to maxed out progress at this time..      Onset date: script dated 03/31/23  REFERRING DIAG:  R09.89 (ICD-10-CM) - Throat tightness    THERAPY DIAG:  Dysphonia  Other voice and resonance disorders  Rationale for Evaluation and Treatment: Rehabilitation  SUBJECTIVE:   SUBJECTIVE STATEMENT: I think I'm about ready to increase the exhalation.  Pt accompanied by: self  PERTINENT HISTORY: Pt had course of ST with SLP - Thomas in 2023 and then 4 sessions of ST with ST- [facility] at [facility] in January-March 2024. PMHx includes anxiety, functional movement disorder. Pt has an integrative medicine therapist who told her she is not breathing well and asked her to obtain a BREATHER device. Pt did so and requires assistance how to use properly. Pt's physiatrist wrote script for this reason.  PAIN:  Are you having pain? Yes: NPRS scale: 5/10 Pain location: abdomen Pain description: pressure, constant Aggravating factors: doing something to make it tense up Relieving factors: not constipated, or gentle deep breathing or light pressure or curling up or distraction,self massage  FALLS: Has patient fallen in last 6 months? No  PATIENT GOALS: Improve talking/singing, improve breath support  OBJECTIVE:  Note: Objective measures were completed at Evaluation unless otherwise noted.    PATIENT REPORTED OUTCOME MEASURES (PROM): Pt-reported that throat  tightness can vary from day to day - pt unsure if she can scale tightness.                                                                                                                            TREATMENT DATE:  AB = Abdominal breathing  10/20/23: Pt was less-comfortable with d/c until SLP offered a 15 minute screen in 8 weeks to verify pt is performing tasks correctly.  PROM not done today due to pt leaving prior to scoring however pt  has said before that tension varies widely from day to day and so is challenging to scale. She continues to perform massage, and stretching to decr tension, successfully. Today she used EMST and IMST with proper procedure with 20 appropriate sounds each. She incr'd setting on EMST due to feeling 70% effort. SLP told pt she may graduate to the more challenging RMST device for one respiratory function before the other and that was ok. SLP encouraged pt to continue with RMST until she cannot progress and then work with maintenance.   09/14/23: Again, difficult for pt to grade relaxation with hypermobility disorder but pt reported she is doing things to successfully decr tension with voice mostly including manual massage, and vocal relaxation when she is able. Pt completed EMST and IMST with proper procedure 20 adequate sounding blows each. Pt inquired about if SLP is needed prior to next appointment and SLP offered email/phone call for quick questions. SLP suspects next session will be pt's last session due to success thus far in therapy course.   08/16/23: SLP to ask pt about LTGs next session. SLP and pt agreed pt would be benefited by keeping 08/30/23 appointment to get insight on what she and SLP discussed today; If all else remains the same with HEP for EMST/IMST procedure and her body (connective tissue disease) is the reason for feeling off when she performs HEP she is to count that as a set to count towards moving to a more difficult setting on BREATHER device. Pt stated today she feels very tight in para-thyroid  musculature but pt's voice quality remained WNL throughout the entire session. Pt also stated she feels like her voice is weakest in the vocal range of her voice, which SLP told her seemed strange because of her WNL vocal quality. SLP suggested she think about voice lessons from university professor.   08/02/23: Pt's performance for EMST and IMST was WNL. Pt req'd guidance from SLP to ensure she  was not increasing resistance with RMST too quickly. SLP reminded pt that her effort should be >70% for three days prior to moving up the resistance. PT demonstrated understanding. She will come in two weeks and if unchanged, next visit will be in 4 weeks.   07/12/23: Pt's last time where she felt tight enough where she has not wanted to talk was a long time ago. Tension in laryngeal area is such that she feels she cannot project  well. Loan reported one situation she was able to project at work that she felt was successful/baseline since previous session. Today she completed inhalation and exhalation correctly despite feeling like one side of her diaphragm was not expanding as much as the other side. Visually, pt diaphragm area appeared WNL for inhalation and exhalation. Pt is using techniques to decr tension in throat during the week.    06/28/23: Pt will need to scale throat tension next week. She got work schedule for July yesterday. Pt will make appointments as close to once/week as she can get. SLP encouraged pt to think about this time as good practice for after discharge as she will likely not be able to schedule once/week frequency for about 4 weeks. Today pt used visualization to decr a little bit some tension in her throat and neck. She did this without cues from SLP. Lastly she performed her EMST inhale and exhale with independence. SLP to talk with pt next visit and inquire other than relaxation strategies/techniques for laryngeal area and neck what would be another area SLP could give pt skills to incr her independence.  06/21/23: Pt remarked that she has taken visualization of peace as suggested by SLP prevous session and incorporated these into a framework that works for her, when feeling tightness and tension in her throat. Today, pt wanted to move both inhale and exhale up on her Breather devie and with further min discussion with SLP only moved inhale up. She demonstrated good problem  solving strategy today for whether or not to move resistance up or not. Discussion about SLP having almost exhausted skill he has with pt. Leila stated she did not want to be discharged until she was comfortable with doing things correctly. SLP explained that it would be surprising to SLP if pt were seen <10 visits and explained rationale for this.  06/13/23: SLP educated pt on visualization. Pt preferred the term peace to relax due to negative situations with that word in the past. SLP asked pt what images came to mind with peace and pt thought of 5-6 of them including a waterfall, laying under a birch tree, and her husband. SLP also re-educated pt about when to move the BREATHER to a more challenging setting. Pt wanted to move to higher setting without achieving 3 days straight of <70% effort. SLP stressed she is building necessary muscle memory by not moving device up too soon.   06/06/23: Pt needs visualization for vocal relaxation next session. Vocal quality was again excellent today. Pt breathing with AB 85% of the time in 15 minutes conversation. SLP and pt discussed today ways to decr vocal tension during the day and pt return demonstrated. SLP used diagram of how to gradually warm up voice and to decr vocal tension. SLP also educated pt on vocal stamina and fatigue in the red yellow green framework and ascertained skillfully that pt goes from green to red - there is no yellow so SLP suggested pt start to focus on what is her green-yellow feeling and stop there.  Pt has modified vocal exercises to suit her needs so there is not one vocal exercise that remains unmodified from [facility]. So LTG #3 d/c'd.  06/01/23: Excellent (WNL) voice quality today. Fusako reports she is modifying the vocal exercises at [facility] with some techniques of her own. SLP told pt to bring the exercises [facility] gave her and whatever aspects of those exercises SLP can verify their accuracy. Pt used AB in 8  minutes conversation  today 90% of the time. SLP again told pt do NOT move intensity of BREATHER device prior to performing 10 reps BID x3 days at <70% effort. SLP provided rationale for this.  05/18/23: Needs to be asked about throat tightness next session. Pt communicated within first 5 minutes of session x2 that she was unsure she was performing HEP with BREATHER correctly. SLP observed pt with inhalation and exhalation and she was performing with excellent procedure.  Long discussion about why not advised to switch settings from set to set or from day to day - need to assess if effort level was under 70% and if so, switch setting to next highest number, and SLP provided rationale for this. Pt thanked SLP.  04/25/23: SLP assessed pt's performance with BREATHER device. SLP had to provide mod cues usually for proper procedure for inhalation and exhalation. Instruction occurred with shoulder position, head position, and when to increase resistance  PATIENT EDUCATION: Education details: see treatment date Person educated: Patient Education method: Explanation, Demonstration, and Verbal cues Education comprehension: verbalized understanding, returned demonstration, verbal cues required, and needs further education  HOME EXERCISE PROGRAM: BREATHER device, voice exercises pt finds helpful  GOALS: Goals reviewed with patient? Yes, in general  SHORT TERM GOALS: Target date: 05/27/23  Pt will use BREATHER device with proper procedure in 3 sessions Baseline: 05/18/23, 06/01/23 Goal status: met  2.  Pt will complete vocal exercises with proper procedure with rare min A in 3 sessions Baseline:  Goal status: addressed 06/06/23, see LTGs  3.  Pt will use trained technique to reduce tension in throat (when tension present to decr vocal quality) by a scale point of 1 in scale 1-10 (10=most tension ever felt) Baseline:  Goal status: not addressed, modified and moved to LTG  4. Pt will use AB in 8  minutes conversation with 80% success in 3 sessions Baseline: 06/01/23, 06/13/23 Goal status: modified based on 06/01/23, partially met   LONG TERM GOALS: Target date:  10/22/23  Pt will improve PROM from initial administration Baseline:  Goal status: deferred - pt left prior to scoring  2.  Pt will use BREATHER device with proper procedure in 2 sessions after 05/27/23 Baseline: 06/21/23 Goal status: met  3.  Pt will complete vocal exercises with proper procedure, with mod I, in 2 sessions Baseline:  Goal status: d/c - see note from 06/06/23  4.  In/between two sessions pt will use/report use of trained technique/s to reduce tension in throat, after 05/27/23 Baseline: 06/28/23  Goal status: partially met   5.  Pt will demo knowledge of when to incr resistance of BREATHER, when asked (or observed) in 2 sessions Baseline: 06/21/23 Goal status: met   6.  In/between two sessions pt will use/report use of trained technique/s to reduce tension in throat down one point on a pt-based scale of tension Baseline: 09/14/23 Goal status: partially met    ASSESSMENT:  CLINICAL IMPRESSION: Patient is a 33 y.o. F who was seen today for treatment due to periodic tightness in throat when speaking making it difficult for her to talk and sing. Pt states this tightness is dependent upon her joints each day. See treatment date above for today's date for further details on today's session. Pt's voice quality remained good to excellent/WNL in at least the last 4-5 therapy sessions. At time of eval, pt's voice quality sounded very much WNL, but pt reports that voice quality is intermittent, and decreases with sx of tight feeling in her  throat. To date, SLP has not heard variability in pt's voice quality in session. Given pt's current sx and report, SLP cont to have no reason to believe any laryngeal structural abnormalities exist that would warrant follow up laryngeal imaging at this time. Certainly if this changed  during treatment course, SLP will recommend strongly to referring MD to make a referral to ENT.  Pt and SLP agree on d/c at this time, with a fifteen minute screen scheduled 12/20/23.  OBJECTIVE IMPAIRMENTS: include voice disorder. These impairments are limiting patient from household responsibilities, ADLs/IADLs, and effectively communicating at home and in community. Factors affecting potential to achieve goals and functional outcome are co-morbidities.. Patient will benefit from skilled SLP services to address above impairments and improve overall function.  REHAB POTENTIAL: Good  PLAN:  Discharge today  PLANNED INTERVENTIONS: Cueing hierachy, Oral motor exercises, SLP instruction and feedback, Compensatory strategies, Patient/family education, and [id number] Treatment of speech (30 or 45 min)     Itai Barbian, CCC-SLP 10/20/2023, 5:07 PM

## 2023-11-28 NOTE — Progress Notes (Signed)
 Office Visit Note  Patient: Brittany [name]             Date of Birth: September 26, 1990           MRN: [id number]             PCP: Job Lukes, PA Referring: Claudene Arthea HERO, DO Visit Date: 12/12/2023 Occupation: Data Unavailable  Subjective:  Positive ANA  History of Present Illness: Brittany [name] is a 33 y.o. female seen for the evaluation of positive ANA and polyarthralgia.  According the patient her symptoms started when she was a teenager and was diagnosed as growing pains.  She states she mostly had pain in her hips and her ankles.  She is to get frequent injuries in her hips and her ankles while she used to run.  She stated while she was in high school her coach was a physical therapist who gave her exercises.  In postcollege year she did competitive running and had frequent injuries to her ankles and right hip.  She was also diagnosed with pelvic floor dysfunction.  She states she saw several sports medicine and orthopedic doctors without much relief.  She was mostly given braces, NSAIDs and advised physical therapy.  In 2018 she had right ankle surgery for hypermobility and injury.  She states the symptoms got worse after that.  She started having increased pain in her ankle right hip and some generalized achiness.  She remained under care of sports medicine, orthopedics and physical therapist.  She also saw pain specialist.  She realized in 2023 that she had hypermobility.  She was referred to Dr. Claudene this year.  She is also going to integrative therapies which she finds helpful.  She is going to a pelvic floor therapist as well.  She states she continues to have some generalized pain and achiness.  She describes discomfort in her entire spine, her shoulders, her hips and her ankles.  There is no history of joint swelling.  She also has generalized achiness in her muscles and fatigue.  She has increased muscle pain after activity.  There is no history of oral ulcers,  nasal ulcers, sicca symptoms, malar rash, photosensitivity, Raynaud's or lymphadenopathy.  There is no history of inflammatory arthritis.  She was found to have positive ANA in May and for that reason she was referred to me. She has a brother who may have hypermobility.  She also believes that her mother and maternal grandmother had hypermobility.  There is no family history of autoimmune disease. 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  She enjoys singing and solicitor.  She states she used to be very active in the past and enjoyed running and weightlifting.  She is having difficulty doing all those activities.  She is married, gravida 0.  There is no history of DVTs.  She does not drink any alcohol and never been a smoker.    Activities of Daily Living:  Patient reports morning stiffness for 1 hour.   Patient Reports nocturnal pain.  Difficulty dressing/grooming: Denies Difficulty climbing stairs: Denies Difficulty getting out of chair: Denies Difficulty using hands for taps, buttons, cutlery, and/or writing: Denies  Review of Systems  Constitutional:  Positive for fatigue.  HENT:  Negative for mouth sores and mouth dryness.   Eyes:  Negative for dryness.  Respiratory:  Negative  for shortness of breath.   Cardiovascular:  Negative for chest pain and palpitations.  Gastrointestinal:  Positive for constipation and diarrhea. Negative for blood in stool.  Endocrine: Negative for increased urination.  Genitourinary:  Negative for involuntary urination.  Musculoskeletal:  Positive for joint pain, gait problem, joint pain, myalgias, muscle weakness, morning stiffness, muscle tenderness and myalgias. Negative for joint swelling.  Skin:  Negative for color change, rash, hair loss and sensitivity to sunlight.  Allergic/Immunologic: Negative for susceptible to  infections.  Neurological:  Positive for dizziness, light-headedness and headaches.  Hematological:  Negative for swollen glands.  Psychiatric/Behavioral:  Negative for depressed mood and sleep disturbance. The patient is nervous/anxious.     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   Right ankle pain 03/03/2015   IBS (irritable bowel syndrome) 05/25/2009    Past Medical History:  Diagnosis Date   Ankle injury    Anxiety    Eating disorder    Frequent headaches     Family History  Problem Relation Age of Onset   Depression Mother    Anxiety disorder Mother    Depression Brother    Anxiety disorder Brother    Thyroid  disease Brother    Diabetes Maternal Grandmother    Hearing loss Maternal Grandmother    Alcohol abuse Maternal Grandfather    Depression Maternal Grandfather    Diabetes Maternal Grandfather    Dementia Paternal Grandfather    Past Surgical History:  Procedure Laterality Date   ANKLE SURGERY  2018   HERNIA REPAIR     INTERSTIM IMPLANT PLACEMENT  2023   INTERSTIM IMPLANT REMOVAL  2023   WISDOM TOOTH EXTRACTION     Social History   Tobacco Use   Smoking status: Never    Passive exposure: Never   Smokeless tobacco: Never  Vaping Use   Vaping status: Never Used  Substance Use Topics   Alcohol use: No    Alcohol/week: 0.0 standard drinks of alcohol   Drug use: No   Social History   Social  History Narrative   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  Constitutional:      Appearance: She is well-developed.  HENT:     Head: Normocephalic and atraumatic.  Eyes:     Conjunctiva/sclera: Conjunctivae normal.  Cardiovascular:     Rate and Rhythm: Normal rate and regular rhythm.     Heart sounds: Normal heart sounds.  Pulmonary:     Effort: Pulmonary  effort is normal.     Breath sounds: Normal breath sounds.  Abdominal:     General: Bowel sounds are normal.     Palpations: Abdomen is soft.  Musculoskeletal:     Cervical back: Normal range of motion.  Lymphadenopathy:     Cervical: No cervical adenopathy.  Skin:    General: Skin is warm and dry.     Capillary Refill: Capillary refill takes less than 2 seconds.  Neurological:     Mental Status: She is alert and oriented to person, place, and time.  Psychiatric:        Behavior: Behavior normal.      Musculoskeletal Exam: Cervical, thoracic and lumbar spine were in good range of motion.  Thoracolumbar dextro scoliosis was noted.  There was no SI joint tenderness.  Shoulder joints, elbow joints, wrist joints, MCPs, PIPs and DIPs were in good range of motion with no synovitis.  Hip joints and knee joints were in good range of motion without any warmth swelling or effusion.  Bilateral pes cavus, hammertoes and dorsal spurs were noted.  There was no tenderness over ankles or MTPs.  She had hypermobility in almost all of her joints.   CDAI Exam: CDAI Score: -- Patient Global: --; Provider Global:  -- Swollen: --; Tender: -- Joint Exam 12/12/2023   No joint exam has been documented for this visit   There is currently no information documented on the homunculus. Go to the Rheumatology activity and complete the homunculus joint exam.  Investigation: No additional findings.  Imaging: No results found.  Recent Labs: Lab Results  Component Value Date   WBC 3.9 (L) 06/03/2023   HGB 13.7 06/03/2023   PLT 191.0 06/03/2023   NA 140 06/03/2023   K 4.0 06/03/2023   CL 106 06/03/2023   CO2 27 06/03/2023   GLUCOSE 78 06/03/2023   BUN 15 06/03/2023   CREATININE 0.65 06/03/2023   BILITOT 0.3 06/03/2023   ALKPHOS 41 06/03/2023   AST 14 06/03/2023   ALT 12 06/03/2023   PROT 7.3 06/03/2023   ALBUMIN 4.7 06/03/2023   CALCIUM 9.3 06/03/2023   GFRAA 137 09/19/2019   Jun 03, 2023 PTH 29, calcium 9.3, vitamin D37.36, TSH 2.43, sed rate 3, B12 394, vitamin D  37.36, ferritin 55.8, CRP<1.0, ESR 3, RPR nonreactive, anti-CCP<16, RF<10, uric acid 3.7, ACE 24, ANA 1: 320 nucleolar  Speciality Comments: No specialty comments available.  Procedures:  No procedures performed Allergies: Gabapentin   Assessment / Plan:     Visit Diagnoses: Positive ANA (antinuclear antibody) -she was found to have positive ANA in May 2025 while she had evaluation for polyarthralgia.  She denies any history of oral ulcers, nasal ulcers, sicca symptoms, malar rash, photosensitivity, Raynaud's, lymphadenopathy or inflammatory arthritis.  There is no family history of autoimmune disease.  I will obtain additional labs today to complete the workup.  Plan: Protein / creatinine ratio, urine, ANA, Anti-scleroderma antibody, RNP Antibody, Anti-Smith antibody, Sjogrens syndrome-B extractable nuclear antibody, Sjogrens syndrome-A extractable nuclear antibody, Anti-DNA antibody, double-stranded, C3 and C4, Beta-2  glycoprotein antibodies, Cardiolipin antibodies, IgG, IgM, IgA  Right hip pain-he gives history of chronic right  hip pain for many years.  She had MRI of her right hip joint by Dr. Marquette in the past which showed possible small nondisplaced tear of the right acetabular labrum anteriorly.  No osteoarthritis was noted.  She continues to have chronic discomfort and is followed by Dr. Claudene.  Pes cavus of both feet-bilateral pes cavus,  mild hammertoes were noted.  I advised her to get arch support with metatarsal pads.  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  She wears braces for support.  Other idiopathic scoliosis, thoracolumbar region-thoracolumbar scoliosis was noted.  Previous x-rays from June 2023 of the lumbar spine were reviewed which were unremarkable.  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  She has been followed by Dr. Claudene and also going to integrative therapies.  Functional movement disorder  Irritable bowel syndrome with both constipation and diarrhea  Pelvic floor dysfunction-patient is going to physical therapy.  Anxiety and depression  Orders: Orders Placed This Encounter  Procedures   Protein / creatinine ratio, urine   ANA   Anti-scleroderma antibody   RNP Antibody   Anti-Smith antibody   Sjogrens syndrome-B extractable nuclear antibody   Sjogrens syndrome-A extractable nuclear antibody   Anti-DNA antibody, double-stranded   C3 and C4   Beta-2  glycoprotein antibodies   Cardiolipin antibodies, IgG, IgM, IgA   No orders of the defined types were placed in this encounter.    Follow-Up Instructions: Return for Positive ANA.   Maya Nash, MD  Note - This record has been created using Animal nutritionist.  Chart creation errors have been sought, but may not always  have been located. Such creation errors do not reflect on  the standard of medical care.

## 2023-12-02 ENCOUNTER — Encounter: Payer: Self-pay | Admitting: Family Medicine

## 2023-12-12 ENCOUNTER — Encounter: Payer: Self-pay | Admitting: Rheumatology

## 2023-12-12 ENCOUNTER — Ambulatory Visit: Attending: Rheumatology | Admitting: Rheumatology

## 2023-12-12 VITALS — BP 111/74 | HR 75 | Temp 97.4°F | Resp 15 | Ht 65.0 in | Wt 120.6 lb

## 2023-12-12 DIAGNOSIS — R7689 Other specified abnormal immunological findings in serum: Secondary | ICD-10-CM

## 2023-12-12 DIAGNOSIS — G259 Extrapyramidal and movement disorder, unspecified: Secondary | ICD-10-CM

## 2023-12-12 DIAGNOSIS — F419 Anxiety disorder, unspecified: Secondary | ICD-10-CM

## 2023-12-12 DIAGNOSIS — M6289 Other specified disorders of muscle: Secondary | ICD-10-CM

## 2023-12-12 DIAGNOSIS — M25551 Pain in right hip: Secondary | ICD-10-CM

## 2023-12-12 DIAGNOSIS — M24472 Recurrent dislocation, left ankle: Secondary | ICD-10-CM

## 2023-12-12 DIAGNOSIS — Q6672 Congenital pes cavus, left foot: Secondary | ICD-10-CM

## 2023-12-12 DIAGNOSIS — F50014 Anorexia nervosa, restricting type, in remission: Secondary | ICD-10-CM

## 2023-12-12 DIAGNOSIS — M357 Hypermobility syndrome: Secondary | ICD-10-CM

## 2023-12-12 DIAGNOSIS — Q667 Congenital pes cavus, unspecified foot: Secondary | ICD-10-CM

## 2023-12-12 DIAGNOSIS — K582 Mixed irritable bowel syndrome: Secondary | ICD-10-CM

## 2023-12-12 DIAGNOSIS — M4125 Other idiopathic scoliosis, thoracolumbar region: Secondary | ICD-10-CM

## 2023-12-14 ENCOUNTER — Encounter: Payer: Self-pay | Admitting: Family Medicine

## 2023-12-14 ENCOUNTER — Ambulatory Visit: Admitting: Family Medicine

## 2023-12-14 ENCOUNTER — Ambulatory Visit

## 2023-12-14 VITALS — BP 112/78 | HR 82 | Ht 65.0 in | Wt 121.0 lb

## 2023-12-14 DIAGNOSIS — M25571 Pain in right ankle and joints of right foot: Secondary | ICD-10-CM | POA: Diagnosis not present

## 2023-12-14 DIAGNOSIS — G8929 Other chronic pain: Secondary | ICD-10-CM | POA: Diagnosis not present

## 2023-12-14 DIAGNOSIS — M25551 Pain in right hip: Secondary | ICD-10-CM

## 2023-12-14 DIAGNOSIS — M25572 Pain in left ankle and joints of left foot: Secondary | ICD-10-CM | POA: Diagnosis not present

## 2023-12-14 DIAGNOSIS — S93332A Other subluxation of left foot, initial encounter: Secondary | ICD-10-CM

## 2023-12-14 NOTE — Patient Instructions (Addendum)
 Thank you for coming in today.   Please get an Xray today before you leave   You should hear from MRI scheduling within 1 week. If you do not hear please let me know.    Check back after we get the MRI/MRI-arthrogram results back

## 2023-12-14 NOTE — Progress Notes (Signed)
 I, Leotis Batter, CMA acting as a scribe for Artist Lloyd, MD.  Brittany [name] is a 33 y.o. female who presents to [facility] today for exacerbation of her B, L>R ankle pain. Pt was last seen by Dr. Claudene on 10/19/23 and was started on cromolyn . Also c/o right hip pain.   Today, pt reports pain at lateral aspect of both ankles. Hx of sprains of both ankles.   She notes considerable left ankle pain and dysfunction.  Additionally she has considerable right anterior hip pain dysfunction and mechanical symptoms.  She has been treating both with physical therapy since March of this year with integrative therapies.  She continues home exercise program and bracing and stabilizing exercises for both.  She is exercising every day.  Pertinent review of systems: No fevers or chills  Relevant historical information: IBS and functional movement disorder.  Hypermobility syndrome.   Exam:  BP 112/78   Pulse 82   Ht 5' 5 (1.651 m)   Wt 121 lb (54.9 kg)   SpO2 99%   BMI 20.14 kg/m  General: Well Developed, well nourished, and in no acute distress.   MSK: Left ankle wearing 2 different ankle braces.  Increased laxity.  Strength intact.  Positive talar tilt test.  Right hip normal.  Normal motion mechanical clunk with range of motion.  Decree strength hip abduction.    Lab and Radiology Results  X-ray images right hip visible on pelvis x-ray obtained today personally and independently interpreted. No acute fracture. No significant arthritis.  Await formal radiology review     Assessment and Plan: 33 y.o. female with Chronic left ankle pain and instability not improved with physical therapy and home exercise program.  Plan for MRI of the ankle to further evaluate.  X-ray ankle obtained August 2025  Right hip pain mechanical symptoms concerning for labrum tear and instability.  Plan for MRI arthrogram.  Again not improved with PT.   PDMP not  reviewed this encounter. Orders Placed This Encounter  Procedures   DG Pelvis 1-2 Views    Standing Status:   Future    Number of Occurrences:   1    Expiration Date:   12/13/2024    Reason for Exam (SYMPTOM  OR DIAGNOSIS REQUIRED):   hipe pain    Is patient pregnant?:   No    Preferred imaging location?:   [facility]   MR HIP RIGHT W CONTRAST    Standing Status:   Future    Expiration Date:   12/13/2024    Reason for Exam (SYMPTOM  OR DIAGNOSIS REQUIRED):   right hip pain    If indicated for the ordered procedure, I authorize the administration of contrast media per Radiology protocol:   Yes    What is the patient's sedation requirement?:   No Sedation    Does the patient have a pacemaker or implanted devices?:   No    Preferred imaging location?:   [facility] (table limit-550lbs)   DG FLUORO GUIDED NEEDLE PLC ASPIRATION/INJECTION LOC    Standing Status:   Future    Expiration Date:   12/13/2024    Lab orders requested (DO NOT place separate lab orders, these will be automatically ordered during procedure specimen collection)::   None    Reason for Exam (SYMPTOM  OR DIAGNOSIS REQUIRED):   right hip pain    Is the patient pregnant?:   No    Preferred  Imaging Location?:   [facility]   MR ANKLE LEFT WO CONTRAST    Standing Status:   Future    Expiration Date:   12/13/2024    What is the patient's sedation requirement?:   No Sedation    Does the patient have a pacemaker or implanted devices?:   No    Preferred imaging location?:   [facility] (table limit-550lbs)   No orders of the defined types were placed in this encounter.    Discussed warning signs or symptoms. Please see discharge instructions. Patient expresses understanding.   The above documentation has been reviewed and is accurate and complete Artist Lloyd, M.D.

## 2023-12-15 LAB — C3 AND C4
C3 Complement: 77 mg/dL — ABNORMAL LOW (ref 83–193)
C4 Complement: 13 mg/dL — ABNORMAL LOW (ref 15–57)

## 2023-12-15 LAB — BETA-2 GLYCOPROTEIN ANTIBODIES
Beta-2 Glyco 1 IgA: <2 U/mL (ref <20.0)
Beta-2 Glyco 1 IgM: <2 U/mL (ref <20.0)
Beta-2 Glyco I IgG: <2 U/mL (ref <20.0)

## 2023-12-15 LAB — CARDIOLIPIN ANTIBODIES, IGG, IGM, IGA
Anticardiolipin IgA: <2 [APL'U]/mL (ref <20.0)
Anticardiolipin IgG: <2 [GPL'U]/mL (ref <20.0)
Anticardiolipin IgM: <2 [MPL'U]/mL (ref <20.0)

## 2023-12-15 LAB — ANTI-SMITH ANTIBODY: ENA SM Ab Ser-aCnc: <1 AI

## 2023-12-15 LAB — SJOGRENS SYNDROME-B EXTRACTABLE NUCLEAR ANTIBODY: SSB (La) (ENA) Antibody, IgG: <1 AI

## 2023-12-15 LAB — PROTEIN / CREATININE RATIO, URINE
Creatinine, Urine: 60 mg/dL (ref 20–275)
Protein/Creat Ratio: 67 mg/g{creat} (ref 24–184)
Protein/Creatinine Ratio: 0.067 mg/mg{creat} (ref 0.024–0.184)
Total Protein, Urine: 4 mg/dL — ABNORMAL LOW (ref 5–24)

## 2023-12-15 LAB — ANA: Anti Nuclear Antibody (ANA): POSITIVE — AB

## 2023-12-15 LAB — ANTI-DNA ANTIBODY, DOUBLE-STRANDED: ds DNA Ab: <1 [IU]/mL

## 2023-12-15 LAB — ANTI-SCLERODERMA ANTIBODY: Scleroderma (Scl-70) (ENA) Antibody, IgG: <1 AI

## 2023-12-15 LAB — ANTI-NUCLEAR AB-TITER (ANA TITER): ANA Titer 1: 1:160 {titer} — ABNORMAL HIGH

## 2023-12-15 LAB — SJOGRENS SYNDROME-A EXTRACTABLE NUCLEAR ANTIBODY: SSA (Ro) (ENA) Antibody, IgG: <1 AI

## 2023-12-15 LAB — RNP ANTIBODY: Ribonucleic Protein(ENA) Antibody, IgG: <1 AI

## 2023-12-16 ENCOUNTER — Ambulatory Visit: Payer: Self-pay | Admitting: Rheumatology

## 2023-12-16 NOTE — Progress Notes (Signed)
 Urine protein creatinine ratio normal, ANA  positive, ENA panel negative, complements low, anticardiolipin antibody negative.

## 2023-12-20 ENCOUNTER — Ambulatory Visit: Attending: Physical Medicine and Rehabilitation

## 2023-12-20 ENCOUNTER — Ambulatory Visit: Payer: Self-pay | Admitting: Family Medicine

## 2023-12-20 DIAGNOSIS — R490 Dysphonia: Secondary | ICD-10-CM | POA: Insufficient documentation

## 2023-12-20 DIAGNOSIS — R498 Other voice and resonance disorders: Secondary | ICD-10-CM | POA: Insufficient documentation

## 2023-12-20 NOTE — Therapy (Signed)
 [facility] [address] [place], [place], [address] Phone: [phone]   Fax:  [phone]  Patient Details  Name: Brittany [name] MRN: [id number] Date of Birth: 01-06-1990 Referring Provider:  Lorilee Sven SQUIBB, MD  Encounter Date: 12/20/2023  ST - Screen SLP took pt back into ST room and pt told SLP things were progressing with RMST and she has moved to the burgundy BREATHER device at this time. Pt is still dealing with pelvic floor issues which she states affects the right side of her diaphragm and she is hopeful that this will get worked out in time. SLP encouraged pt to cont to work with BREATHER device and do not progress sooner than when she is feeling she is <70% effort for three days at her current setting. Pt demonstrated understanding. Brittany [name] was reluctant to not see SLP again so SLP agreed to one more appointment as a screen in <90 days (03/13/24). After this visit pt will need a new referral.   Hector Taft, CCC-SLP 12/20/2023, 5:10 PM  [place]  [facility] [address] [place], [place], [address] Phone: [phone]   Fax:  [phone]

## 2023-12-20 NOTE — Progress Notes (Signed)
 Pelvis x-ray looks okay to radiology.

## 2023-12-26 NOTE — Progress Notes (Signed)
 "  Office Visit Note  Patient: Brittany [name]             Date of Birth: 10/25/1990           MRN: [id number]             PCP: Job Lukes, PA Referring: Job Lukes, PA Visit Date: 01/09/2024 Occupation: Data Unavailable  Subjective:  Positive ANA and joint pain  History of Present Illness: Brittany [name] is a 33 y.o. female returns today after her initial visit for the evaluation of positive ANA and joint pain.  According the patient she continues to have generalized achiness.  She continues to have pain in her entire spine, shoulders, hips, ankles and her feet.  There is no history of oral ulcers, nasal ulcers, malar rash, photosensitivity, Raynaud's, lymphadenopathy or inflammatory arthritis.  She continues to have hypermobility in her joints.  She states she had repeat MRI of her hip and the results are pending.    Activities of Daily Living:  Patient reports morning stiffness for 1 hour.   Patient Reports nocturnal pain.  Difficulty dressing/grooming: Denies Difficulty climbing stairs: Reports Difficulty getting out of chair: Denies Difficulty using hands for taps, buttons, cutlery, and/or writing: Denies  Review of Systems  Constitutional:  Positive for fatigue.  HENT:  Negative for mouth sores and mouth dryness.   Eyes:  Negative for dryness.  Respiratory:  Negative for shortness of breath.   Cardiovascular:  Negative for chest pain and palpitations.  Gastrointestinal:  Positive for constipation and diarrhea. Negative for blood in stool.  Endocrine: Negative for increased urination.  Genitourinary:  Negative for involuntary urination.  Musculoskeletal:  Positive for joint pain, gait problem, joint pain, myalgias, muscle weakness, muscle tenderness and myalgias. Negative for joint swelling and morning stiffness.  Skin:  Negative for color change, rash, hair loss and sensitivity to sunlight.  Allergic/Immunologic: Negative for susceptible to  infections.  Neurological:  Positive for light-headedness and headaches. Negative for dizziness.  Hematological:  Negative for swollen glands.  Psychiatric/Behavioral:  Positive for sleep disturbance. Negative for depressed mood. The patient is nervous/anxious.     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   Right ankle pain 03/03/2015   IBS (irritable bowel syndrome) 05/25/2009    Past Medical History:  Diagnosis Date   Ankle injury    Anxiety    Eating disorder    Frequent headaches     Family History  Problem Relation Age of Onset   Depression Mother    Anxiety disorder Mother    Depression Brother    Anxiety disorder Brother    Thyroid  disease Brother    Diabetes Maternal Grandmother    Hearing loss Maternal Grandmother    Alcohol abuse Maternal Grandfather    Depression Maternal Grandfather    Diabetes Maternal Grandfather    Dementia Paternal Grandfather    Past Surgical History:  Procedure Laterality Date   ANKLE SURGERY  2018  HERNIA REPAIR     INTERSTIM IMPLANT PLACEMENT  2023   INTERSTIM IMPLANT REMOVAL  2023   WISDOM TOOTH EXTRACTION     Social History[1] Social History   Social History Narrative   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  Constitutional:      Appearance: She is well-developed.  HENT:     Head: Normocephalic and atraumatic.  Eyes:     Conjunctiva/sclera: Conjunctivae normal.  Cardiovascular:     Rate and Rhythm: Normal rate and regular rhythm.     Heart sounds: Normal heart sounds.  Pulmonary:     Effort: Pulmonary effort is normal.     Breath sounds: Normal breath sounds.  Abdominal:     General: Bowel sounds are normal.     Palpations: Abdomen is soft.  Musculoskeletal:     Cervical back: Normal range of motion.  Lymphadenopathy:     Cervical: No cervical adenopathy.  Skin:    General: Skin is warm and dry.     Capillary Refill: Capillary refill takes less than 2 seconds.  Neurological:     Mental Status: She is alert and oriented to person, place, and time.  Psychiatric:        Behavior: Behavior normal.      Musculoskeletal Exam: Cervical, thoracic and lumbar spine were in good range of motion.  Thoracolumbar scoliosis was noted.  There was no SI joint tenderness.  Shoulder joints, elbow joints, wrist joints, MCPs, PIPs and DIPs were in good range of motion with no synovitis.  Hip joints and knee joints were in good range of motion without any warmth swelling or effusion.  Bilateral pes cavus was noted.  There was no tenderness over ankles or MTPs.  Hypermobility was noted in most of her joints.   CDAI Exam: CDAI Score: -- Patient Global: --; Provider Global: -- Swollen: --; Tender: -- Joint Exam 01/09/2024   No joint exam has been documented for this visit   There is currently no information documented on the homunculus. Go to the Rheumatology activity and complete the homunculus joint exam.  Investigation: No additional  findings.  Imaging: DG FLUORO GUIDED NEEDLE PLC ASPIRATION/INJECTION LOC Result Date: 01/03/2024 EXAM: Right Hip Arthrogram 01/03/2024 04:00:00 PM FLUOROSCOPY DOSE AND TYPE: Radiation Dose Index: Reference Air Kerma (in mGy) = 0.6 COMPARISON: None available CLINICAL HISTORY: Right hip pain FINDINGS: OPERATOR: Informed consent was obtained and universal protocol was observed. Time out was performed with confirmation of patient identity, procedure to be performed and site. A skin entry site was selected with fluoroscopy. Under standard sterile condition, local anesthesia with subcutaneous 1% lidocaine was administered. A 22 gauge spinal needle was inserted into the joint under fluoroscopic guidance. A mixture of 0.1 ml of Multihance , 10 ml of Isovue -M 200, and 10 ml of sterile saline was used to opacify the joint, injecting a total of 12 ml. The needle was removed, spot images were obtained and a sterile bandage was placed. IMPRESSION: 1. Successful fluoroscopic-guided right hip arthrogram. Electronically signed by:  Dasie Hamburg MD 01/03/2024 04:26 PM EST RP Workstation: [id number]   DG Pelvis 1-2 Views Result Date: 12/20/2023 EXAM: 1 or 2 VIEW(S) XRAY OF THE PELVIS 12/14/2023 10:08:04 AM COMPARISON: None available. CLINICAL HISTORY: hip pain FINDINGS: BONES AND JOINTS: No acute fracture. No malalignment. SOFT TISSUES: The soft tissues are unremarkable. IMPRESSION: 1. No acute osseous abnormality to explain hip pain. Electronically signed by: Oneil Devonshire MD 12/20/2023 12:52 AM EST RP Workstation: [id number]    Recent Labs: Lab Results  Component Value Date   WBC 3.9 (L) 06/03/2023   HGB 13.7 06/03/2023   PLT 191.0 06/03/2023   NA 140 06/03/2023   K 4.0 06/03/2023   CL 106 06/03/2023   CO2 27 06/03/2023   GLUCOSE 78 06/03/2023   BUN 15 06/03/2023   CREATININE 0.65 06/03/2023   BILITOT 0.3 06/03/2023   ALKPHOS 41 06/03/2023   AST 14 06/03/2023   ALT 12 06/03/2023   PROT 7.3 06/03/2023    ALBUMIN 4.7 06/03/2023   CALCIUM 9.3 06/03/2023   GFRAA 137 09/19/2019   December 12, 2023 ANA 1: 160 nuclear/nucleolar, ENA panel (dsDNA, SSA, SSB, SCL 70, Smith, RNP) negative, beta-2  GP 1 negative, anticardiolipin negative, urine protein creatinine ratio normal, C3 77, C4 13  Speciality Comments: No specialty comments available.  Procedures:  No procedures performed Allergies: Gabapentin   Assessment / Plan:     Visit Diagnoses: Positive ANA (antinuclear antibody) - December 12, 2023 ANA 1: 160 nuclear/nucleolar, ENA panel (dsDNA, SSA, SSB, SCL 70, Smith, RNP) negative, beta-2  GP 1 negative, anticardiolipin negative, urine protein creatinine ratio normal, C3 77, C4 13 were reviewed with the patient.  She has low titer positive ANA, and hypocomplementemia.  She has no clinical features of lupus or related disease.  She denies history of oral ulcers, nasal ulcers, malar rash, photosensitivity, Raynaud's, lymphadenopathy.  No synovitis was noted on the examination today.  I advised her to contact me if she develops any new symptoms.  Will repeat her labs in 1 year.- Plan: Protein / creatinine ratio, urine, CBC with Differential/Platelet, Comprehensive metabolic panel with GFR, ANA, Anti-DNA antibody, double-stranded, C3 and C4.  I advised her to contact me if she develops any new symptoms.  Right hip pain - Previous MRI showed possible small nondisplaced tear of the right acetabular labrum.  She is followed by Dr. Claudene.  Patient had a recent MRI and the results are pending.  Pes cavus of both feet - Mild hammertoes were noted.  Wearing shoes with arch support was discussed.  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  Other idiopathic scoliosis, thoracolumbar region-neck discomfort.  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  She most likely have arthralgias related to hypermobility.  Functional movement  disorder  Irritable bowel syndrome with both constipation and diarrhea  Pelvic floor dysfunction  Anxiety and depression  Orders: Orders Placed This Encounter  Procedures   Protein / creatinine ratio, urine   CBC with Differential/Platelet   Comprehensive metabolic panel with GFR   ANA   Anti-DNA antibody, double-stranded   C3 and C4   No orders of the defined types were placed in this encounter.    Follow-Up Instructions: Return in about 1 year (around 01/08/2025) for Polyarthralgia, +ANA.   Maya Nash, MD  Note - This record has been created using Animal nutritionist.  Chart creation errors have been sought, but may not always  have been located.  Such creation errors do not reflect on  the standard of medical care.     [1]  Social History Tobacco Use   Smoking status: Never    Passive exposure: Never   Smokeless tobacco: Never  Vaping Use   Vaping status: Never Used  Substance Use Topics   Alcohol use: No    Alcohol/week: 0.0 standard drinks of alcohol   Drug use: No   "

## 2023-12-31 ENCOUNTER — Other Ambulatory Visit: Payer: Self-pay | Admitting: Physician Assistant

## 2024-01-03 ENCOUNTER — Ambulatory Visit
Admission: RE | Admit: 2024-01-03 | Discharge: 2024-01-03 | Disposition: A | Discharge status: Home / Self Care | Source: Ambulatory Visit | Attending: Family Medicine | Admitting: Family Medicine

## 2024-01-03 DIAGNOSIS — M25551 Pain in right hip: Secondary | ICD-10-CM

## 2024-01-03 MED ORDER — GADOBENATE DIMEGLUMINE 529 MG/ML IV SOLN
0.1000 mL | Freq: Once | INTRAVENOUS | Status: AC
  Administered 2024-01-03: 0.1 mL via INTRA_ARTICULAR

## 2024-01-03 MED ORDER — IOPAMIDOL (ISOVUE-M 200) INJECTION 41%
10.0000 mL | Freq: Once | INTRAMUSCULAR | Status: AC
  Administered 2024-01-03: 10 mL via INTRA_ARTICULAR

## 2024-01-07 ENCOUNTER — Ambulatory Visit
Admission: RE | Admit: 2024-01-07 | Discharge: 2024-01-07 | Disposition: A | Source: Ambulatory Visit | Attending: Family Medicine | Admitting: Family Medicine

## 2024-01-07 DIAGNOSIS — G8929 Other chronic pain: Secondary | ICD-10-CM

## 2024-01-07 DIAGNOSIS — S93332A Other subluxation of left foot, initial encounter: Secondary | ICD-10-CM

## 2024-01-09 ENCOUNTER — Ambulatory Visit: Attending: Rheumatology | Admitting: Rheumatology

## 2024-01-09 ENCOUNTER — Encounter: Payer: Self-pay | Admitting: Rheumatology

## 2024-01-09 VITALS — BP 113/75 | HR 96 | Temp 98.3°F | Resp 14 | Ht 64.0 in | Wt 117.2 lb

## 2024-01-09 DIAGNOSIS — F419 Anxiety disorder, unspecified: Secondary | ICD-10-CM

## 2024-01-09 DIAGNOSIS — M4125 Other idiopathic scoliosis, thoracolumbar region: Secondary | ICD-10-CM

## 2024-01-09 DIAGNOSIS — M6289 Other specified disorders of muscle: Secondary | ICD-10-CM

## 2024-01-09 DIAGNOSIS — F32A Depression, unspecified: Secondary | ICD-10-CM | POA: Diagnosis not present

## 2024-01-09 DIAGNOSIS — R7689 Other specified abnormal immunological findings in serum: Secondary | ICD-10-CM | POA: Diagnosis not present

## 2024-01-09 DIAGNOSIS — M357 Hypermobility syndrome: Secondary | ICD-10-CM

## 2024-01-09 DIAGNOSIS — Q6672 Congenital pes cavus, left foot: Secondary | ICD-10-CM

## 2024-01-09 DIAGNOSIS — M25551 Pain in right hip: Secondary | ICD-10-CM | POA: Diagnosis not present

## 2024-01-09 DIAGNOSIS — Q6671 Congenital pes cavus, right foot: Secondary | ICD-10-CM

## 2024-01-09 DIAGNOSIS — K582 Mixed irritable bowel syndrome: Secondary | ICD-10-CM

## 2024-01-09 DIAGNOSIS — M24472 Recurrent dislocation, left ankle: Secondary | ICD-10-CM

## 2024-01-09 DIAGNOSIS — G259 Extrapyramidal and movement disorder, unspecified: Secondary | ICD-10-CM

## 2024-01-09 NOTE — Patient Instructions (Signed)
 Standing Labs We placed an order today for your standing lab work.   Please have your standing labs drawn in January  Please have your labs drawn 2 weeks prior to your appointment so that the provider can discuss your lab results at your appointment, if possible.  Please note that you may see your imaging and lab results in MyChart before we have reviewed them. We will contact you once all results are reviewed. Please allow our office up to 72 hours to thoroughly review all of the results before contacting the office for clarification of your results.  WALK-IN LAB HOURS  Monday through Thursday from 8:00 am - 4:30 pm and Friday from 8:00 am-12:00 pm.  Patients with office visits requiring labs will be seen before walk-in labs.  You may encounter longer than normal wait times. Please allow additional time. Wait times may be shorter on  Monday and Thursday afternoons.  We do not book appointments for walk-in labs. We appreciate your patience and understanding with our staff.   Labs are drawn by [facility]. Please bring your co-pay at the time of your lab draw.  You may receive a bill from [facility] for your lab work.  Please note if you are on Hydroxychloroquine and and an order has been placed for a Hydroxychloroquine level,  you will need to have it drawn 4 hours or more after your last dose.  If you wish to have your labs drawn at another location, please call the office 24 hours in advance so we can fax the orders.  The office is located at [address], [place], [place] [address]   If you have any questions regarding directions or hours of operation,  please call [phone].   As a reminder, please drink plenty of water prior to coming for your lab work. Thanks!

## 2024-01-10 ENCOUNTER — Encounter: Payer: Self-pay | Admitting: Family Medicine

## 2024-01-10 DIAGNOSIS — M357 Hypermobility syndrome: Secondary | ICD-10-CM

## 2024-01-10 DIAGNOSIS — M2550 Pain in unspecified joint: Secondary | ICD-10-CM

## 2024-01-10 DIAGNOSIS — M25551 Pain in right hip: Secondary | ICD-10-CM

## 2024-01-10 DIAGNOSIS — M24472 Recurrent dislocation, left ankle: Secondary | ICD-10-CM

## 2024-01-10 DIAGNOSIS — R7689 Other specified abnormal immunological findings in serum: Secondary | ICD-10-CM

## 2024-01-10 DIAGNOSIS — G8929 Other chronic pain: Secondary | ICD-10-CM

## 2024-01-16 NOTE — Telephone Encounter (Signed)
 The ones made at the sports medicine center are going to be a little softer typically than the ones made by a podiatrist.  Either would be okay.  If you would like a referral please let me know.

## 2024-01-16 NOTE — Telephone Encounter (Signed)
 Forwarding to Dr. Denyse Amass to review and advise.

## 2024-01-17 NOTE — Progress Notes (Unsigned)
 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  We discussed with patient about different medications.  Cromolyn  could be beneficial.  Started as a nose spray.  Discussed the potential role of oral but it appeared that it was maybe not on the formulary for her.  May need to switch to 100 mg per 2 mL up to 4 times daily is the recommended dose for any of the GI cromolyn  dosing.  Follow-up again with me in 3 months.     Updated 01/19/2024 Brittany [name] is a 34 y.o. female coming in with complaint of ***       Past Medical History:  Diagnosis Date   Ankle injury    Anxiety    Eating disorder    Frequent headaches    Past Surgical History:  Procedure Laterality Date   ANKLE SURGERY  2018   HERNIA REPAIR     INTERSTIM IMPLANT PLACEMENT  2023   INTERSTIM IMPLANT REMOVAL  2023   WISDOM TOOTH EXTRACTION     Social History   Socioeconomic History   Marital status: Married    Spouse name: Not on file   Number of children: Not on file   Years of education: Not on file   Highest education level: Bachelor's degree (e.g., BA, AB, BS)  Occupational History   Not on file  Tobacco Use   Smoking status: Never    Passive exposure: Never   Smokeless tobacco: Never  Vaping Use   Vaping status: Never Used  Substance and Sexual Activity   Alcohol use: No    Alcohol/week: 0.0 standard drinks of alcohol   Drug use: No   Sexual activity: Yes    Birth control/protection: None  Other Topics Concern   Not on file  Social History Narrative   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[place]   Social Drivers of Health   Tobacco Use: Low Risk (01/09/2024)   Patient History    Smoking Tobacco Use: Never    Smokeless Tobacco Use: Never    Passive Exposure:  Never  Financial Resource Strain: Low Risk (06/12/2023)   Overall Financial Resource Strain (CARDIA)    Difficulty of Paying Living Expenses: Not very hard  Food Insecurity: No Food Insecurity (06/12/2023)   Hunger Vital Sign    Worried About Running Out of Food in the Last Year: Never true    Ran Out of Food in the Last Year: Never true  Transportation Needs: No Transportation Needs (06/12/2023)   PRAPARE - Administrator, Civil Service (Medical): No    Lack of Transportation (Non-Medical): No  Physical Activity: Sufficiently Active (06/12/2023)   Exercise Vital Sign    Days of Exercise per Week: 5 days    Minutes of Exercise per Session: 40 min  Stress: Stress Concern Present (06/12/2023)   [employer] of Occupational Health - Occupational Stress Questionnaire    Feeling of Stress : To some extent  Social Connections: Socially Integrated (06/12/2023)   Social Connection and Isolation Panel    Frequency of Communication with Friends and Family: Twice a week    Frequency of Social Gatherings with Friends and Family: Once a week    Attends Religious Services: More than 4 times per year  Active Member of Clubs or Organizations: Yes    Attends Banker Meetings: More than 4 times per year    Marital Status: Married  Depression (PHQ2-9): Medium Risk (06/13/2023)   Depression (PHQ2-9)    PHQ-2 Score: 10  Alcohol Screen: Low Risk (06/12/2023)   Alcohol Screen    Last Alcohol Screening Score (AUDIT): 0  Housing: Low Risk (06/12/2023)   Housing Stability Vital Sign    Unable to Pay for Housing in the Last Year: No    Number of Times Moved in the Last Year: 0    Homeless in the Last Year: No  Utilities: Not on file  Health Literacy: Not on file   Allergies[1] Family History  Problem Relation Age of Onset   Depression Mother    Anxiety disorder Mother    Depression Brother    Anxiety disorder Brother    Thyroid  disease Brother    Diabetes Maternal Grandmother     Hearing loss Maternal Grandmother    Alcohol abuse Maternal Grandfather    Depression Maternal Grandfather    Diabetes Maternal Grandfather    Dementia Paternal Grandfather     Current Outpatient Medications (Analgesics):    acetaminophen (TYLENOL) 325 MG tablet, Take by mouth. (Patient taking differently: Take by mouth as needed.)  Current Outpatient Medications (Other):    desipramine (NORPRAMIN) 50 MG tablet, Take 50 mg by mouth daily.   escitalopram  (LEXAPRO ) 20 MG tablet, TAKE 1 TABLET BY MOUTH EVERY DAY   magnesium citrate SOLN, Take by mouth.   Reviewed prior external information including notes and imaging from  primary care provider As well as notes that were available from care everywhere and other healthcare systems.  Past medical history, social, surgical and family history all reviewed in electronic medical record.  No pertanent information unless stated regarding to the chief complaint.   Review of Systems:  No headache, visual changes, nausea, vomiting, diarrhea, constipation, dizziness, abdominal pain, skin rash, fevers, chills, night sweats, weight loss, swollen lymph nodes, body aches, joint swelling, chest pain, shortness of breath, mood changes. POSITIVE muscle aches  Objective  Last menstrual period 12/26/2023.   General: No apparent distress alert and oriented x3 mood and affect normal, dressed appropriately.  HEENT: Pupils equal, extraocular movements intact  Respiratory: Patient's speak in full sentences and does not appear short of breath  Cardiovascular: No lower extremity edema, non tender, no erythema      Impression and Recommendations:           [1]  Allergies Allergen Reactions   Gabapentin Other (See Comments)    Vertigo   "

## 2024-01-18 NOTE — Progress Notes (Signed)
 Left ankle MRI shows some tendinitis and bone irritation at the outside or lateral part of the ankle.  Additionally you have some tendinitis of the inside or medial part of the ankle.  You have an appointment with Dr. Claudene on the 15th.  Either follow-up with him or myself for these issues.

## 2024-01-18 NOTE — Progress Notes (Signed)
 Hip MRI is concerning for a labrum tear.  Recommend checking back with myself or Dr. Claudene for these issues.

## 2024-01-19 ENCOUNTER — Ambulatory Visit: Admitting: Family Medicine

## 2024-01-19 ENCOUNTER — Ambulatory Visit

## 2024-01-19 VITALS — BP 110/78 | Ht 64.5 in | Wt 117.0 lb

## 2024-01-19 VITALS — BP 110/70 | HR 89 | Ht 64.5 in | Wt 116.0 lb

## 2024-01-19 DIAGNOSIS — S73191A Other sprain of right hip, initial encounter: Secondary | ICD-10-CM

## 2024-01-19 DIAGNOSIS — M25551 Pain in right hip: Secondary | ICD-10-CM | POA: Diagnosis not present

## 2024-01-19 DIAGNOSIS — M357 Hypermobility syndrome: Secondary | ICD-10-CM

## 2024-01-19 DIAGNOSIS — Q6672 Congenital pes cavus, left foot: Secondary | ICD-10-CM

## 2024-01-19 DIAGNOSIS — Q6671 Congenital pes cavus, right foot: Secondary | ICD-10-CM

## 2024-01-19 DIAGNOSIS — M25571 Pain in right ankle and joints of right foot: Secondary | ICD-10-CM

## 2024-01-19 DIAGNOSIS — M25572 Pain in left ankle and joints of left foot: Secondary | ICD-10-CM

## 2024-01-19 DIAGNOSIS — S73199A Other sprain of unspecified hip, initial encounter: Secondary | ICD-10-CM | POA: Insufficient documentation

## 2024-01-19 DIAGNOSIS — M25373 Other instability, unspecified ankle: Secondary | ICD-10-CM

## 2024-01-19 DIAGNOSIS — G8929 Other chronic pain: Secondary | ICD-10-CM

## 2024-01-19 NOTE — Assessment & Plan Note (Signed)
 Patient is imaging that does show a nondisplaced labral tear noted of the right hip.  Difficult to say if this is potentially causing the discomfort or pain at this time.  Patient though has failed all other conservative therapy including formal physical therapy.  Has had pelvic floor dysfunction and had injections with significant side effects so would like to potentially avoid that.  Pain is affecting daily activities and patient is now ambulating with the aid of a cane.  At this point we will refer patient to orthopedic surgery to discuss the potential for surgical intervention.  Patient is in agreement with the plan, all questions were answered.  I personally spent a total of 31 minutes in the care of the patient today including preparing to see the patient, getting/reviewing separately obtained history, performing a medically appropriate exam/evaluation, counseling and educating, placing orders, documenting clinical information in the EHR, independently interpreting results, communicating results, and coordinating care.

## 2024-01-19 NOTE — Patient Instructions (Signed)
 Here if you have questions Referral to Dr. Genelle

## 2024-01-19 NOTE — Progress Notes (Addendum)
 [facility] A Department of [facility]. [facility]   PCP: Job Lukes, [place]  CHIEF COMPLAINT:  custom orthotic fitting for chronic ankle pain, pes cavus  HPI: Patient is a pleasant 34 y.o. female who presents today for for fitting of custom orthotics.  Patient was referred by Dr. Artist Lloyd.  Patient has been dealing with chronic ankle instability with chronic ankle pain at as well as pes cavus for the past many years.  She states she prefers to walk barefoot but recently her sports medicine doctor suggested she get fitted for a pair of custom orthotics.  She is here today based on his recommendation.   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   Anorexia nervosa, restricting type, in full remission, moderate (HCC) 02/16/2018   History of depression 02/16/2018   GAD (generalized anxiety disorder) 12/16/2017   Pelvic floor dysfunction 12/16/2017   Right hip pain 11/02/2017   Grade 2 ankle sprain, right, initial encounter 05/16/2016   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   Right ankle pain 03/03/2015   IBS (irritable bowel syndrome) 05/25/2009    PSurg:  Past Surgical History:  Procedure Laterality Date   ANKLE SURGERY  2018   HERNIA REPAIR     INTERSTIM IMPLANT PLACEMENT  2023   INTERSTIM IMPLANT REMOVAL  2023   WISDOM TOOTH EXTRACTION      Allergies: Gabapentin  Meds:  Previous Medications   ACETAMINOPHEN (TYLENOL) 325 MG TABLET    Take by mouth.   DESIPRAMINE (NORPRAMIN) 50 MG TABLET    Take 50 mg by mouth daily.   ESCITALOPRAM  (LEXAPRO ) 20 MG TABLET    TAKE 1  TABLET BY MOUTH EVERY DAY   MAGNESIUM CITRATE SOLN    Take by mouth.    Social:  Social History   Tobacco Use   Smoking status: Never    Passive exposure: Never   Smokeless tobacco: Never  Substance Use Topics   Alcohol use: No    Alcohol/week: 0.0 standard drinks of alcohol    REVIEW OF SYSTEMS:  ROS negative except as noted in HPI above   Objective Exam:  Vitals:   01/19/24 1142  BP: 110/78  Weight: 117 lb (53.1 kg)  Height: 5' 4.5 (1.638 m)    GENERAL: Patient is afebrile, Vital signs reviewed, well appearing, Patient appears comfortable, Alert and lucid. No apparent distress.   Physical Exam   Ortho Exam:  On inspection patient has excessive callus buildup over balls of her feet and heel from chronic barefoot walking outdoors.  No evidence of erythema, ecchymoses, or edema present.  Patient does not have any specific tenderness to palpation.  Patient has excessive range of motion's in bilateral ankles consistent with hypermobility syndrome.  On foot analysis patient has pes cavus feet bilaterally, right more severe than left.  She also has breakdown of her transverse arch.  Ambulation assessment reveals over pronation.  RESULTS:  Labs: No results found for this or any previous visit (from the past 48 hours).  Imaging:  No orders to display  Assessment/Plan:  1. Pes cavus of both feet   2. Chronic instability of ankle   3. Chronic pain of both ankles   4. Hypermobility syndrome   Patient was fitted for a : Fit 'n Run semi-rigid orthotic  The orthotic was heated, placed on the orthotic stand. The patient was positioned in subtalar neutral position and 10 degrees of ankle dorsiflexion in a weight bearing stance on the heated orthotic blank After completion of molding Blank: Fit 'n Run - size 7 Posting:  none added Base:  none  -After fitting of custom orthotics new orthotics were placed in patient's walking shoes of choice.  Patient endorsed increased  comfort with standing and on ambulation.  On repeat ambulation assessment patient demonstrates more neutral alignment of subtalar joint and improved walking mechanics.  Evidence of decreased pronation. -Patient will begin to incorporate wearing custom orthotics into daily shoes as much as possible with goal of wearing them close to 100% the time while walking or exercising. - Informed patient that she is welcome to return for orthotic adjustments if she notices any issues with fit or comfort in upcoming weeks/months.  Repeat visit for orthotic adjustment will be free of charge - Patient understands and agrees to treatment plan.  No further questions or concerns at this time.   New Prescriptions   No medications on file    Medications, medical history, allergies, surgical history, hospitalizations, family history, social history, ROS and vitals entered by nursing staff and reviewed by myself.  I discussed with the patient the diagnosis, treatment plan, indications for return to the emergency department, and for expected follow-up. The patient verbalized an understanding. The patient is asked if there are any questions or concerns. We discuss the case, until all issues are addressed to the patient's satisfaction.  Follow up per instructions including returning for additional office visit if symptoms worsen or proceeding to the emergency department or urgent care in the next 12-24hrs if there is an acute concerning increasing symptoms, pain, fevers, or other symptoms.  Prentice Agent, DO  2:55 PM, 01/19/2024

## 2024-01-23 ENCOUNTER — Encounter: Payer: Self-pay | Admitting: Family Medicine

## 2024-02-15 ENCOUNTER — Ambulatory Visit (HOSPITAL_BASED_OUTPATIENT_CLINIC_OR_DEPARTMENT_OTHER): Admitting: Orthopaedic Surgery

## 2024-03-13 ENCOUNTER — Ambulatory Visit: Attending: Physical Medicine and Rehabilitation

## 2024-06-19 ENCOUNTER — Encounter: Admitting: Physician Assistant

## 2025-01-15 ENCOUNTER — Ambulatory Visit: Admitting: Rheumatology
